# Patient Record
Sex: Female | Born: 1960
Health system: Southern US, Community
[De-identification: ages and names within clinical notes are randomized; demographics above are authoritative.]

## PROBLEM LIST (undated history)

## (undated) DIAGNOSIS — I10 Essential (primary) hypertension: Secondary | ICD-10-CM

## (undated) DIAGNOSIS — Z5189 Encounter for other specified aftercare: Secondary | ICD-10-CM

## (undated) DIAGNOSIS — C801 Malignant (primary) neoplasm, unspecified: Secondary | ICD-10-CM

## (undated) DIAGNOSIS — E119 Type 2 diabetes mellitus without complications: Secondary | ICD-10-CM

## (undated) DIAGNOSIS — F209 Schizophrenia, unspecified: Secondary | ICD-10-CM

## (undated) DIAGNOSIS — K589 Irritable bowel syndrome without diarrhea: Secondary | ICD-10-CM

## (undated) DIAGNOSIS — F319 Bipolar disorder, unspecified: Secondary | ICD-10-CM

## (undated) DIAGNOSIS — F1120 Opioid dependence, uncomplicated: Secondary | ICD-10-CM

## (undated) DIAGNOSIS — G8929 Other chronic pain: Secondary | ICD-10-CM

## (undated) HISTORY — PX: ANKLE SURGERY: SHX546

## (undated) HISTORY — PX: ABDOMINAL HYSTERECTOMY: SHX81

## (undated) HISTORY — PX: NEPHRECTOMY: SHX65

---

## 2016-08-28 HISTORY — PX: COLONOSCOPY: SHX174

## 2016-10-15 HISTORY — PX: ESOPHAGOGASTRODUODENOSCOPY: SHX1529

## 2019-04-06 ENCOUNTER — Other Ambulatory Visit: Payer: Self-pay

## 2019-04-06 ENCOUNTER — Encounter: Payer: Self-pay | Admitting: Emergency Medicine

## 2019-04-06 ENCOUNTER — Emergency Department
Admission: EM | Admit: 2019-04-06 | Discharge: 2019-04-06 | Disposition: A | Discharge status: Home / Self Care | Payer: Medicare Other | Source: Home / Self Care

## 2019-04-06 DIAGNOSIS — R1032 Left lower quadrant pain: Secondary | ICD-10-CM | POA: Diagnosis not present

## 2019-04-06 DIAGNOSIS — R197 Diarrhea, unspecified: Secondary | ICD-10-CM

## 2019-04-06 DIAGNOSIS — K921 Melena: Secondary | ICD-10-CM

## 2019-04-06 DIAGNOSIS — R112 Nausea with vomiting, unspecified: Secondary | ICD-10-CM

## 2019-04-06 NOTE — Discharge Instructions (Signed)
°  Due to the severity of your pain and medical history, it is recommended you have your daughter drive you to the hospital this evening for further evaluation and treatment of your symptoms.

## 2019-04-06 NOTE — ED Provider Notes (Signed)
[facility]    CSN: [id number] Arrival date & time: 04/06/19  1548     History   Chief Complaint Chief Complaint  Patient presents with  . Emesis    HPI Amy Nunez is a 58 y.o. female.   HPI Amy Nunez is a 58 y.o. female presenting to UC with c/o 6 days of worsening nausea, vomiting, diarrhea with worsening Left lower abdominal cramping as well. Today she started to pass blood clots in her stool.  Hx of diverticulitis in the past, she needed to be hospitalized for it. She moved to the area 3 weeks ago so she is unfamiliar with the area. She has taken her previously prescribed Vicodin and Morphine for Right leg pain but that has not helped with her abdominal pain. Denies fever or chills, no urinary symptoms. Hx of diabetes. Her sugar was about 120 today, but has been okay so far. No HA or dizziness.   History reviewed. No pertinent past medical history.  There are no active problems to display for this patient.   History reviewed. No pertinent surgical history.  OB History   No obstetric history on file.      Home Medications    Prior to Admission medications   Not on File    Family History No family history on file.  Social History Social History   Tobacco Use  . Smoking status: Never Smoker  . Smokeless tobacco: Never Used  Substance Use Topics  . Alcohol use: Not on file  . Drug use: Not on file     Allergies   Penicillins, Ciprofloxacin, and Red dye   Review of Systems Review of Systems  Constitutional: Negative for chills and fever.  Gastrointestinal: Positive for abdominal pain, blood in stool, diarrhea, nausea and vomiting.  Neurological: Negative for dizziness, light-headedness and headaches.     Physical Exam Triage Vital Signs ED Triage Vitals [04/06/19 1610]  Enc Vitals Group     BP 113/68     Pulse Rate 84     Resp      Temp 98.1 F (36.7 C)     Temp Source Oral     SpO2 95 %     Weight 204 lb (92.5 kg)      Height      Head Circumference      Peak Flow      Pain Score      Pain Loc      Pain Edu?      Excl. in [place]?    No data found.  Updated Vital Signs BP 113/68 (BP Location: Left Arm)   Pulse 84   Temp 98.1 F (36.7 C) (Oral)   Wt 204 lb (92.5 kg)   SpO2 95%   Visual Acuity Right Eye Distance:   Left Eye Distance:   Bilateral Distance:    Right Eye Near:   Left Eye Near:    Bilateral Near:     Physical Exam Vitals signs and nursing note reviewed.  Constitutional:      Appearance: Normal appearance. She is well-developed.  HENT:     Head: Normocephalic and atraumatic.  Neck:     Musculoskeletal: Normal range of motion.  Cardiovascular:     Rate and Rhythm: Normal rate and regular rhythm.  Pulmonary:     Effort: Pulmonary effort is normal.     Breath sounds: Normal breath sounds.  Abdominal:     General: There is no distension.  Palpations: Abdomen is soft.     Tenderness: There is abdominal tenderness. There is guarding. There is no right CVA tenderness or left CVA tenderness.    Musculoskeletal: Normal range of motion.  Skin:    General: Skin is warm and dry.  Neurological:     Mental Status: She is alert and oriented to person, place, and time.  Psychiatric:        Behavior: Behavior normal.      UC Treatments / Results  Labs (all labs ordered are listed, but only abnormal results are displayed) Labs Reviewed - No data to display  EKG   Radiology No results found.  Procedures Procedures (including critical care time)  Medications Ordered in UC Medications - No data to display  Initial Impression / Assessment and Plan / UC Course  I have reviewed the triage vital signs and the nursing notes.  Pertinent labs & imaging results that were available during my care of the patient were reviewed by me and considered in my medical decision making (see chart for details).    Due to hx of needing hospitalization for diverticulitis, severity  of pain and new symptom of blood in stool today, recommend pt evaluated in emergency department for further evaluation and treatment. Pt's daughter drove her here. She declined EMS transport but is agreeable to go to [facility] Emergency Department.    Final Clinical Impressions(s) / UC Diagnoses   Final diagnoses:  LLQ abdominal pain  Blood clots in stool  Nausea vomiting and diarrhea     Discharge Instructions      Due to the severity of your pain and medical history, it is recommended you have your daughter drive you to the hospital this evening for further evaluation and treatment of your symptoms.     ED Prescriptions    None     Controlled Substance Prescriptions [place] Controlled Substance Registry consulted? Not Applicable   Tyrell Antonio 04/06/19 1643

## 2019-04-06 NOTE — ED Triage Notes (Signed)
Patient c/o vomiting, diarrhea, left sided abdominal cramps x 6 days, fever on Friday.

## 2019-04-07 MED ORDER — INSULIN LISPRO 100 UNIT/ML ~~LOC~~ SOLN
1.00 | SUBCUTANEOUS | Status: DC
Start: ? — End: 2019-04-07

## 2019-04-07 MED ORDER — GENERIC EXTERNAL MEDICATION
Status: DC
Start: ? — End: 2019-04-07

## 2019-04-07 MED ORDER — AMLODIPINE BESYLATE 10 MG PO TABS
10.00 | ORAL_TABLET | ORAL | Status: DC
Start: 2019-04-08 — End: 2019-04-07

## 2019-04-07 MED ORDER — QUETIAPINE FUMARATE 200 MG PO TABS
200.00 | ORAL_TABLET | ORAL | Status: DC
Start: 2019-04-08 — End: 2019-04-07

## 2019-04-07 MED ORDER — SODIUM CHLORIDE 0.9 % IV SOLN
10.00 | INTRAVENOUS | Status: DC
Start: ? — End: 2019-04-07

## 2019-04-07 MED ORDER — TOPIRAMATE 25 MG PO TABS
25.00 | ORAL_TABLET | ORAL | Status: DC
Start: 2019-04-07 — End: 2019-04-07

## 2019-04-07 MED ORDER — INSULIN LISPRO 100 UNIT/ML ~~LOC~~ SOLN
1.00 | SUBCUTANEOUS | Status: DC
Start: 2019-04-07 — End: 2019-04-07

## 2019-04-07 MED ORDER — SODIUM CHLORIDE 0.9 % IV SOLN
75.00 | INTRAVENOUS | Status: DC
Start: ? — End: 2019-04-07

## 2019-04-07 MED ORDER — ONDANSETRON HCL 4 MG/2ML IJ SOLN
4.00 | INTRAMUSCULAR | Status: DC
Start: ? — End: 2019-04-07

## 2019-04-07 MED ORDER — GABAPENTIN 300 MG PO CAPS
300.00 | ORAL_CAPSULE | ORAL | Status: DC
Start: 2019-04-07 — End: 2019-04-07

## 2019-04-07 MED ORDER — MORPHINE SULFATE ER 15 MG PO TBCR
30.00 | EXTENDED_RELEASE_TABLET | ORAL | Status: DC
Start: 2019-04-07 — End: 2019-04-07

## 2019-04-07 MED ORDER — TRAZODONE HCL 100 MG PO TABS
100.00 | ORAL_TABLET | ORAL | Status: DC
Start: 2019-04-07 — End: 2019-04-07

## 2019-04-07 MED ORDER — LISINOPRIL 20 MG PO TABS
20.00 | ORAL_TABLET | ORAL | Status: DC
Start: 2019-04-08 — End: 2019-04-07

## 2019-04-07 MED ORDER — ENOXAPARIN SODIUM 40 MG/0.4ML ~~LOC~~ SOLN
40.00 | SUBCUTANEOUS | Status: DC
Start: 2019-04-08 — End: 2019-04-07

## 2019-04-07 MED ORDER — BENZTROPINE MESYLATE 1 MG PO TABS
1.00 | ORAL_TABLET | ORAL | Status: DC
Start: 2019-04-08 — End: 2019-04-07

## 2019-04-07 MED ORDER — ASPIRIN EC 81 MG PO TBEC
81.00 | DELAYED_RELEASE_TABLET | ORAL | Status: DC
Start: 2019-04-08 — End: 2019-04-07

## 2019-04-07 MED ORDER — PANTOPRAZOLE SODIUM 40 MG PO TBEC
40.00 | DELAYED_RELEASE_TABLET | ORAL | Status: DC
Start: 2019-04-08 — End: 2019-04-07

## 2019-04-07 MED ORDER — INSULIN GLARGINE 100 UNIT/ML ~~LOC~~ SOLN
1.00 | SUBCUTANEOUS | Status: DC
Start: 2019-04-07 — End: 2019-04-07

## 2019-04-07 MED ORDER — INSULIN GLARGINE 100 UNIT/ML ~~LOC~~ SOLN
1.00 | SUBCUTANEOUS | Status: DC
Start: ? — End: 2019-04-07

## 2019-04-07 MED ORDER — ATORVASTATIN CALCIUM 80 MG PO TABS
80.00 | ORAL_TABLET | ORAL | Status: DC
Start: 2019-04-08 — End: 2019-04-07

## 2019-04-07 MED ORDER — QUETIAPINE FUMARATE 200 MG PO TABS
400.00 | ORAL_TABLET | ORAL | Status: DC
Start: 2019-04-07 — End: 2019-04-07

## 2019-04-07 MED ORDER — NITROGLYCERIN 0.4 MG SL SUBL
0.40 | SUBLINGUAL_TABLET | SUBLINGUAL | Status: DC
Start: ? — End: 2019-04-07

## 2019-04-07 MED ORDER — HYDROMORPHONE HCL 1 MG/ML IJ SOLN
.50 | INTRAMUSCULAR | Status: DC
Start: ? — End: 2019-04-07

## 2019-05-17 ENCOUNTER — Emergency Department (HOSPITAL_BASED_OUTPATIENT_CLINIC_OR_DEPARTMENT_OTHER)
Admission: EM | Admit: 2019-05-17 | Discharge: 2019-05-17 | Disposition: A | Discharge status: Home / Self Care | Payer: Medicare Other | Attending: Emergency Medicine | Admitting: Emergency Medicine

## 2019-05-17 ENCOUNTER — Other Ambulatory Visit: Payer: Self-pay

## 2019-05-17 ENCOUNTER — Encounter (HOSPITAL_BASED_OUTPATIENT_CLINIC_OR_DEPARTMENT_OTHER): Payer: Self-pay | Admitting: *Deleted

## 2019-05-17 DIAGNOSIS — E119 Type 2 diabetes mellitus without complications: Secondary | ICD-10-CM | POA: Diagnosis not present

## 2019-05-17 DIAGNOSIS — R197 Diarrhea, unspecified: Secondary | ICD-10-CM | POA: Diagnosis not present

## 2019-05-17 DIAGNOSIS — R112 Nausea with vomiting, unspecified: Secondary | ICD-10-CM | POA: Diagnosis not present

## 2019-05-17 DIAGNOSIS — R1084 Generalized abdominal pain: Secondary | ICD-10-CM | POA: Diagnosis present

## 2019-05-17 DIAGNOSIS — Z87891 Personal history of nicotine dependence: Secondary | ICD-10-CM | POA: Diagnosis not present

## 2019-05-17 DIAGNOSIS — I10 Essential (primary) hypertension: Secondary | ICD-10-CM | POA: Diagnosis not present

## 2019-05-17 HISTORY — DX: Essential (primary) hypertension: I10

## 2019-05-17 HISTORY — DX: Type 2 diabetes mellitus without complications: E11.9

## 2019-05-17 HISTORY — DX: Malignant (primary) neoplasm, unspecified: C80.1

## 2019-05-17 HISTORY — DX: Encounter for other specified aftercare: Z51.89

## 2019-05-17 LAB — CBC WITH DIFFERENTIAL/PLATELET
Abs Immature Granulocytes: 0.03 10*3/uL (ref 0.00–0.07)
Basophils Absolute: 0 10*3/uL (ref 0.0–0.1)
Basophils Relative: 0 %
Eosinophils Absolute: 0.2 10*3/uL (ref 0.0–0.5)
Eosinophils Relative: 2 %
HCT: 37.5 % (ref 36.0–46.0)
Hemoglobin: 12.4 g/dL (ref 12.0–15.0)
Immature Granulocytes: 0 %
Lymphocytes Relative: 26 %
Lymphs Abs: 2.2 10*3/uL (ref 0.7–4.0)
MCH: 31.3 pg (ref 26.0–34.0)
MCHC: 33.1 g/dL (ref 30.0–36.0)
MCV: 94.7 fL (ref 80.0–100.0)
Monocytes Absolute: 0.5 10*3/uL (ref 0.1–1.0)
Monocytes Relative: 6 %
Neutro Abs: 5.5 10*3/uL (ref 1.7–7.7)
Neutrophils Relative %: 66 %
Platelets: 321 10*3/uL (ref 150–400)
RBC: 3.96 MIL/uL (ref 3.87–5.11)
RDW: 13.3 % (ref 11.5–15.5)
WBC: 8.4 10*3/uL (ref 4.0–10.5)
nRBC: 0 % (ref 0.0–0.2)

## 2019-05-17 LAB — URINALYSIS, MICROSCOPIC (REFLEX)

## 2019-05-17 LAB — URINALYSIS, ROUTINE W REFLEX MICROSCOPIC
Bilirubin Urine: NEGATIVE
Glucose, UA: NEGATIVE mg/dL
Hgb urine dipstick: NEGATIVE
Ketones, ur: NEGATIVE mg/dL
Nitrite: NEGATIVE
Protein, ur: NEGATIVE mg/dL
Specific Gravity, Urine: 1.02 (ref 1.005–1.030)
pH: 6 (ref 5.0–8.0)

## 2019-05-17 LAB — COMPREHENSIVE METABOLIC PANEL
ALT: 14 U/L (ref 0–44)
AST: 18 U/L (ref 15–41)
Albumin: 4.2 g/dL (ref 3.5–5.0)
Alkaline Phosphatase: 129 U/L — ABNORMAL HIGH (ref 38–126)
Anion gap: 14 (ref 5–15)
BUN: 16 mg/dL (ref 6–20)
CO2: 23 mmol/L (ref 22–32)
Calcium: 8.9 mg/dL (ref 8.9–10.3)
Chloride: 103 mmol/L (ref 98–111)
Creatinine, Ser: 1.15 mg/dL — ABNORMAL HIGH (ref 0.44–1.00)
GFR calc Af Amer: 60 mL/min — ABNORMAL LOW (ref >60)
GFR calc non Af Amer: 52 mL/min — ABNORMAL LOW (ref >60)
Glucose, Bld: 160 mg/dL — ABNORMAL HIGH (ref 70–99)
Potassium: 3.9 mmol/L (ref 3.5–5.1)
Sodium: 140 mmol/L (ref 135–145)
Total Bilirubin: 0.2 mg/dL — ABNORMAL LOW (ref 0.3–1.2)
Total Protein: 7.6 g/dL (ref 6.5–8.1)

## 2019-05-17 LAB — LIPASE, BLOOD: Lipase: 39 U/L (ref 11–51)

## 2019-05-17 MED ORDER — PROMETHAZINE HCL 25 MG PO TABS: 25.0000 mg | ORAL_TABLET | Freq: Four times a day (QID) | ORAL | 0 refills | Status: DC | PRN

## 2019-05-17 MED ORDER — SODIUM CHLORIDE 0.9 % IV BOLUS
1000.0000 mL | Freq: Once | INTRAVENOUS | Status: AC
  Administered 2019-05-17: 1000 mL via INTRAVENOUS

## 2019-05-17 MED ORDER — PROMETHAZINE HCL 25 MG/ML IJ SOLN
25.0000 mg | Freq: Once | INTRAMUSCULAR | Status: AC
  Administered 2019-05-17: 25 mg via INTRAVENOUS
  Filled 2019-05-17: qty 1

## 2019-05-17 MED ORDER — SODIUM CHLORIDE 0.9 % IV BOLUS: 1000.0000 mL | Freq: Once | INTRAVENOUS | Status: DC

## 2019-05-17 MED ORDER — KETAMINE HCL 10 MG/ML IJ SOLN
0.2000 mg/kg | Freq: Once | INTRAMUSCULAR | Status: AC
  Administered 2019-05-17: 16 mg via INTRAVENOUS
  Filled 2019-05-17: qty 1

## 2019-05-17 NOTE — ED Provider Notes (Signed)
[place] EMERGENCY DEPARTMENT Provider Note   CSN: [id number] Arrival date & time: 05/17/19  1742     History   Chief Complaint Chief Complaint  Patient presents with   Abdominal Pain    HPI Amy Nunez is a 58 y.o. female.     Patient with history of diabetes, renal cancer status post right-sided nephrectomy, hysterectomy --presents to the emergency department with complaint of ongoing abdominal pain over the past month.  Patient has been seen at an outside emergency department for this.  She had a CT scan a week ago which was essentially negative.  She continues to have cramping abdominal pain.  She states that she saw GI.  Records show visit to GI on 04/30/2019.  She had a KUB with a nonobstructive bowel gas pattern.  She had a ultrasound study which was negative.  Stool studies were positive for C. difficile.  She is currently in the middle of a course of oral vancomycin.  Patient states that she has had multiple episodes of diarrhea and has vomited multiple times.  Patient has chronic pain and is on oral narcotics chronically (MS Contin and Norco).  She denies any fevers, chest pain or shortness of breath.  She denies any blood in her stool.  She denies any urinary symptoms.  She has not had a colonoscopy in a long time.       Past Medical History:  Diagnosis Date   Blood transfusion without reported diagnosis    Cancer ([place])    renal cancer   Diabetes mellitus without complication ([place])    Hypertension     There are no active problems to display for this patient.   Past Surgical History:  Procedure Laterality Date   ABDOMINAL HYSTERECTOMY     ANKLE SURGERY     NEPHRECTOMY Right      OB History   No obstetric history on file.      Home Medications    Prior to Admission medications   Not on File    Family History No family history on file.  Social History Social History   Tobacco Use   Smoking status: Former Smoker    Smokeless tobacco: Never Used  Substance Use Topics   Alcohol use: Not Currently    Frequency: Never   Drug use: Never     Allergies   Penicillins, Ciprofloxacin, and Red dye   Review of Systems Review of Systems  Constitutional: Negative for fever.  HENT: Negative for rhinorrhea and sore throat.   Eyes: Negative for redness.  Respiratory: Negative for cough.   Cardiovascular: Negative for chest pain.  Gastrointestinal: Positive for abdominal pain, diarrhea, nausea and vomiting. Negative for blood in stool and constipation.  Genitourinary: Negative for dysuria.  Musculoskeletal: Negative for myalgias.  Skin: Negative for rash.  Neurological: Negative for headaches.     Physical Exam Updated Vital Signs BP (!) 167/96    Pulse 87    Temp 98.3 F (36.8 C) (Oral)    Resp 18    Ht 5\' 5"  (1.651 m)    Wt 81.6 kg    SpO2 99%    BMI 29.95 kg/m   Physical Exam Vitals signs and nursing note reviewed.  Constitutional:      Appearance: She is well-developed.  HENT:     Head: Normocephalic and atraumatic.  Eyes:     General:        Right eye: No discharge.  Left eye: No discharge.     Conjunctiva/sclera: Conjunctivae normal.  Neck:     Musculoskeletal: Normal range of motion and neck supple.  Cardiovascular:     Rate and Rhythm: Normal rate and regular rhythm.     Heart sounds: Normal heart sounds.  Pulmonary:     Effort: Pulmonary effort is normal.     Breath sounds: Normal breath sounds.  Abdominal:     Palpations: Abdomen is soft.     Tenderness: There is generalized abdominal tenderness (Mild generalized tenderness). There is no guarding or rebound. Negative signs include Murphy's sign and McBurney's sign.  Skin:    General: Skin is warm and dry.  Neurological:     Mental Status: She is alert.      ED Treatments / Results  Labs (all labs ordered are listed, but only abnormal results are displayed) Labs Reviewed  URINALYSIS, ROUTINE W REFLEX MICROSCOPIC  - Abnormal; Notable for the following components:      Result Value   Leukocytes,Ua TRACE (*)    All other components within normal limits  COMPREHENSIVE METABOLIC PANEL - Abnormal; Notable for the following components:   Glucose, Bld 160 (*)    Creatinine, Ser 1.15 (*)    Alkaline Phosphatase 129 (*)    Total Bilirubin 0.2 (*)    GFR calc non Af Amer 52 (*)    GFR calc Af Amer 60 (*)    All other components within normal limits  URINALYSIS, MICROSCOPIC (REFLEX) - Abnormal; Notable for the following components:   Bacteria, UA FEW (*)    All other components within normal limits  CBC WITH DIFFERENTIAL/PLATELET  LIPASE, BLOOD    EKG None  Radiology No results found.  Procedures Procedures (including critical care time)  Medications Ordered in ED Medications  sodium chloride 0.9 % bolus 1,000 mL (has no administration in time range)  sodium chloride 0.9 % bolus 1,000 mL (1,000 mLs Intravenous New Bag/Given 05/17/19 1922)  ketamine (KETALAR) injection 16 mg (16 mg Intravenous Given 05/17/19 1950)  promethazine (PHENERGAN) injection 25 mg (25 mg Intravenous Given 05/17/19 1946)     Initial Impression / Assessment and Plan / ED Course  I have reviewed the triage vital signs and the nursing notes.  Pertinent labs & imaging results that were available during my care of the patient were reviewed by me and considered in my medical decision making (see chart for details).        Patient seen and examined.  Reviewed records.  Work-up pending.  Vital signs reviewed and are as follows: BP (!) 167/96    Pulse 87    Temp 98.3 F (36.8 C) (Oral)    Resp 18    Ht 5\' 5"  (1.651 m)    Wt 81.6 kg    SpO2 99%    BMI 29.95 kg/m    Work-up tonight in the emergency department is reassuring.  Fluids given, antiemetics given, ketamine given for chronic pain.  Normal white blood cell count.  No electrolyte abnormalities.  Normal lipase.  No signs of UTI.  I do not feel that the patient requires  repeat imaging tonight.  She has had 2 CAT scans in the past 2 months.  She has had stool studies and an ultrasound of the gallbladder.  She is following with GI.  She is encouraged to continue the complete course of her vancomycin.  She is frustrated that she has not had any relief as of yet.  9:13 PM patient  improving symptomatically with Phenergan, fluids, and ketamine.  She has been up to the restroom and does not appear to be in any distress.  Reviewed findings tonight with patient.  I strongly encouraged follow-up with her GI doctor next week.  Comfortable with discharge to home at this time.  Discussed that I do not feel that she needs any repeat imaging in light of previous work-up and reassuring lab work.  The patient was urged to return to the Emergency Department immediately with worsening of current symptoms, worsening abdominal pain, persistent vomiting, blood noted in stools, fever, or any other concerns. The patient verbalized understanding.    Final Clinical Impressions(s) / ED Diagnoses   Final diagnoses:  Generalized abdominal pain  Nausea vomiting and diarrhea   Patient with abdominal pain. Vitals are stable, no fever. Labs reassuring. Imaging not indicated. No signs of dehydration, patient is tolerating PO's. Lungs are clear and no signs suggestive of PNA. Low concern for appendicitis, cholecystitis, pancreatitis, ruptured viscus, UTI, kidney stone, aortic dissection, aortic aneurysm or other emergent abdominal etiology. Supportive therapy indicated with return if symptoms worsen.    ED Discharge Orders         Ordered    promethazine (PHENERGAN) 25 MG tablet  Every 6 hours PRN     05/17/19 2112           Carlisle Cater, Hershal Coria 05/17/19 2115    Tegeler, Gwenyth Allegra, MD 05/17/19 2318

## 2019-05-17 NOTE — Discharge Instructions (Signed)
Please read and follow all provided instructions.  Your diagnoses today include:  1. Generalized abdominal pain   2. Nausea vomiting and diarrhea     Tests performed today include:  Blood counts and electrolytes  Blood tests to check liver and kidney function  Blood tests to check pancreas function  Urine test to look for infection  Vital signs. See below for your results today.   Medications prescribed:   Phenergan (promethazine) - for nausea and vomiting  Take any prescribed medications only as directed.  Home care instructions:   Follow any educational materials contained in this packet.  Continue taking your home medications including the oral vancomycin.  Follow-up instructions: Please follow-up with your primary care provider in the next 2 days for further evaluation of your symptoms.    Return instructions:  SEEK IMMEDIATE MEDICAL ATTENTION IF:  The pain does not go away or becomes severe   A temperature above 101F develops   Repeated vomiting occurs (multiple episodes)   The pain becomes localized to portions of the abdomen. The right side could possibly be appendicitis. In an adult, the left lower portion of the abdomen could be colitis or diverticulitis.   Blood is being passed in stools or vomit (bright red or black tarry stools)   You develop chest pain, difficulty breathing, dizziness or fainting, or become confused, poorly responsive, or inconsolable (young children)  If you have any other emergent concerns regarding your health  Additional Information: Abdominal (belly) pain can be caused by many things. Your caregiver performed an examination and possibly ordered blood/urine tests and imaging (CT scan, x-rays, ultrasound). Many cases can be observed and treated at home after initial evaluation in the emergency department. Even though you are being discharged home, abdominal pain can be unpredictable. Therefore, you need a repeated exam if your pain  does not resolve, returns, or worsens. Most patients with abdominal pain don't have to be admitted to the hospital or have surgery, but serious problems like appendicitis and gallbladder attacks can start out as nonspecific pain. Many abdominal conditions cannot be diagnosed in one visit, so follow-up evaluations are very important.  Your vital signs today were: BP (!) 167/96    Pulse 87    Temp 98.3 F (36.8 C) (Oral)    Resp 18    Ht 5\' 5"  (1.651 m)    Wt 81.6 kg    SpO2 99%    BMI 29.95 kg/m  If your blood pressure (bp) was elevated above 135/85 this visit, please have this repeated by your doctor within one month. --------------

## 2019-05-17 NOTE — ED Triage Notes (Signed)
PT reports she has had abdominal x 1 month. She was evaluated at another hospital and started on antibiotics. States she is still having pain. "I can't get no relief". Reports she has diarrhea x 5 and vomited x 2 today

## 2019-06-18 ENCOUNTER — Emergency Department (HOSPITAL_BASED_OUTPATIENT_CLINIC_OR_DEPARTMENT_OTHER)
Admission: EM | Admit: 2019-06-18 | Discharge: 2019-06-18 | Disposition: A | Discharge status: Home / Self Care | Payer: Medicare HMO | Attending: Emergency Medicine | Admitting: Emergency Medicine

## 2019-06-18 ENCOUNTER — Encounter (HOSPITAL_BASED_OUTPATIENT_CLINIC_OR_DEPARTMENT_OTHER): Payer: Self-pay

## 2019-06-18 ENCOUNTER — Other Ambulatory Visit: Payer: Self-pay

## 2019-06-18 ENCOUNTER — Emergency Department (HOSPITAL_BASED_OUTPATIENT_CLINIC_OR_DEPARTMENT_OTHER): Payer: Medicare HMO

## 2019-06-18 DIAGNOSIS — Z87891 Personal history of nicotine dependence: Secondary | ICD-10-CM | POA: Insufficient documentation

## 2019-06-18 DIAGNOSIS — E119 Type 2 diabetes mellitus without complications: Secondary | ICD-10-CM | POA: Insufficient documentation

## 2019-06-18 DIAGNOSIS — I10 Essential (primary) hypertension: Secondary | ICD-10-CM | POA: Diagnosis not present

## 2019-06-18 DIAGNOSIS — M545 Low back pain, unspecified: Secondary | ICD-10-CM

## 2019-06-18 HISTORY — DX: Irritable bowel syndrome, unspecified: K58.9

## 2019-06-18 MED ORDER — DEXAMETHASONE 4 MG PO TABS
8.0000 mg | ORAL_TABLET | Freq: Once | ORAL | Status: AC
  Administered 2019-06-18: 22:00:00 8 mg via ORAL
  Filled 2019-06-18: qty 2

## 2019-06-18 MED ORDER — HYDROMORPHONE HCL 1 MG/ML IJ SOLN
1.0000 mg | Freq: Once | INTRAMUSCULAR | Status: AC
  Administered 2019-06-18: 22:00:00 1 mg via INTRAMUSCULAR
  Filled 2019-06-18: qty 1

## 2019-06-18 MED ORDER — DEXAMETHASONE 4 MG PO TABS: 4.0000 mg | ORAL_TABLET | Freq: Two times a day (BID) | ORAL | 0 refills | Status: DC

## 2019-06-18 MED ORDER — IBUPROFEN 400 MG PO TABS
600.0000 mg | ORAL_TABLET | Freq: Once | ORAL | Status: AC
  Administered 2019-06-18: 600 mg via ORAL
  Filled 2019-06-18: qty 1

## 2019-06-18 MED ORDER — OXYCODONE-ACETAMINOPHEN 5-325 MG PO TABS: 1.0000 | ORAL_TABLET | Freq: Three times a day (TID) | ORAL | 0 refills | Status: DC | PRN

## 2019-06-18 MED ORDER — LORAZEPAM 1 MG PO TABS
1.0000 mg | ORAL_TABLET | Freq: Once | ORAL | Status: AC
  Administered 2019-06-18: 1 mg via ORAL
  Filled 2019-06-18: qty 1

## 2019-06-18 NOTE — ED Triage Notes (Signed)
Pt c/o lower back pain and bilat hip pain x 2 weeks-pain started after a slip/near fall-NAD-to triage in w/c

## 2019-06-18 NOTE — ED Notes (Signed)
Patient transported to X-ray 

## 2019-06-18 NOTE — Discharge Instructions (Signed)
Continue to take ibuprofen 600 mg every 6 hours as needed for pain in addition to prescribed medications.

## 2019-06-23 NOTE — ED Provider Notes (Signed)
[place] EMERGENCY DEPARTMENT Provider Note   CSN: [id number] Arrival date & time: 06/18/19  2106     History   Chief Complaint Chief Complaint  Patient presents with  . Back Pain    HPI Amy Nunez is a 58 y.o. female.     HPI   58 year old female with lower back and hip pain.  No trauma or strain.  Onset about 2 weeks ago.  Persistent since then.  Hurts at rest and worse with movement.  No numbness or tingling.  Denies any other acute pain.  No rash.  No fevers.  Has been taking over-the-counter pain medication without much improvement.  Past Medical History:  Diagnosis Date  . Blood transfusion without reported diagnosis   . Cancer [facility])    renal cancer  . Diabetes mellitus without complication ([place])   . Hypertension   . IBS (irritable bowel syndrome)     There are no active problems to display for this patient.   Past Surgical History:  Procedure Laterality Date  . ABDOMINAL HYSTERECTOMY    . ANKLE SURGERY    . NEPHRECTOMY Right      OB History   No obstetric history on file.      Home Medications    Prior to Admission medications   Medication Sig Start Date End Date Taking? Authorizing Provider  dexamethasone (DECADRON) 4 MG tablet Take 1 tablet (4 mg total) by mouth 2 (two) times daily. 06/18/19   Virgel Manifold, MD  oxyCODONE-acetaminophen (PERCOCET/ROXICET) 5-325 MG tablet Take 1 tablet by mouth every 8 (eight) hours as needed for severe pain. 06/18/19   Virgel Manifold, MD  promethazine (PHENERGAN) 25 MG tablet Take 1 tablet (25 mg total) by mouth every 6 (six) hours as needed for nausea or vomiting. 05/17/19   Carlisle Cater, PA-C    Family History No family history on file.  Social History Social History   Tobacco Use  . Smoking status: Former Research scientist (life sciences)  . Smokeless tobacco: Never Used  Substance Use Topics  . Alcohol use: Not Currently    Frequency: Never  . Drug use: Never     Allergies   Penicillins,  Ciprofloxacin, and Red dye   Review of Systems Review of Systems  All systems reviewed and negative, other than as noted in HPI.  Physical Exam Updated Vital Signs BP 133/75 (BP Location: Right Arm)   Pulse (!) 108   Temp 99.1 F (37.3 C) (Oral)   Resp 20   SpO2 98%   Physical Exam Vitals signs and nursing note reviewed.  Constitutional:      General: She is not in acute distress.    Appearance: She is well-developed. She is obese.  HENT:     Head: Normocephalic and atraumatic.  Eyes:     General:        Right eye: No discharge.        Left eye: No discharge.     Conjunctiva/sclera: Conjunctivae normal.  Neck:     Musculoskeletal: Neck supple.  Cardiovascular:     Rate and Rhythm: Normal rate and regular rhythm.     Heart sounds: Normal heart sounds. No murmur. No friction rub. No gallop.   Pulmonary:     Effort: Pulmonary effort is normal. No respiratory distress.     Breath sounds: Normal breath sounds.  Abdominal:     General: There is no distension.     Palpations: Abdomen is soft.     Tenderness: There is  no abdominal tenderness.  Musculoskeletal:     Comments: Mild tenderness to palpation across the lumbar spine both in the midline and paraspinally.  Does not seem sniffily worse in the midline.  There are no overlying skin changes.  Strength is normal in bilateral lower extremities.  Sensation is intact to light touch.  Skin:    General: Skin is warm and dry.  Neurological:     Mental Status: She is alert.  Psychiatric:        Behavior: Behavior normal.        Thought Content: Thought content normal.      ED Treatments / Results  Labs (all labs ordered are listed, but only abnormal results are displayed) Labs Reviewed - No data to display  EKG None  Radiology No results found.  Procedures Procedures (including critical care time)  Medications Ordered in ED Medications  HYDROmorphone (DILAUDID) injection 1 mg (1 mg Intramuscular Given  06/18/19 2206)  dexamethasone (DECADRON) tablet 8 mg (8 mg Oral Given 06/18/19 2205)  ibuprofen (ADVIL) tablet 600 mg (600 mg Oral Given 06/18/19 2205)  LORazepam (ATIVAN) tablet 1 mg (1 mg Oral Given 06/18/19 2205)     Initial Impression / Assessment and Plan / ED Course  I have reviewed the triage vital signs and the nursing notes.  Pertinent labs & imaging results that were available during my care of the patient were reviewed by me and considered in my medical decision making (see chart for details).        58 year old female with lower back pain.  Likely musculoskeletal etiology.  No particular "red flags."  Plan symptomatic treatment.  No trauma.  No clear indication for imaging at this time.  Return precautions were discussed.  Outpatient follow-up otherwise.  Final Clinical Impressions(s) / ED Diagnoses   Final diagnoses:  Acute midline low back pain without sciatica    ED Discharge Orders         Ordered    dexamethasone (DECADRON) 4 MG tablet  2 times daily     06/18/19 2238    oxyCODONE-acetaminophen (PERCOCET/ROXICET) 5-325 MG tablet  Every 8 hours PRN     06/18/19 2238           Virgel Manifold, MD 06/23/19 1122

## 2019-06-27 DIAGNOSIS — M25571 Pain in right ankle and joints of right foot: Secondary | ICD-10-CM | POA: Diagnosis not present

## 2019-06-27 DIAGNOSIS — E1169 Type 2 diabetes mellitus with other specified complication: Secondary | ICD-10-CM | POA: Diagnosis not present

## 2019-06-27 DIAGNOSIS — E785 Hyperlipidemia, unspecified: Secondary | ICD-10-CM | POA: Diagnosis not present

## 2019-06-27 DIAGNOSIS — I129 Hypertensive chronic kidney disease with stage 1 through stage 4 chronic kidney disease, or unspecified chronic kidney disease: Secondary | ICD-10-CM | POA: Diagnosis not present

## 2019-06-27 DIAGNOSIS — N183 Chronic kidney disease, stage 3 unspecified: Secondary | ICD-10-CM | POA: Diagnosis not present

## 2019-06-27 DIAGNOSIS — F209 Schizophrenia, unspecified: Secondary | ICD-10-CM | POA: Diagnosis not present

## 2019-06-27 DIAGNOSIS — Z86718 Personal history of other venous thrombosis and embolism: Secondary | ICD-10-CM | POA: Diagnosis not present

## 2019-06-27 DIAGNOSIS — Z85528 Personal history of other malignant neoplasm of kidney: Secondary | ICD-10-CM | POA: Diagnosis not present

## 2019-06-27 DIAGNOSIS — K5790 Diverticulosis of intestine, part unspecified, without perforation or abscess without bleeding: Secondary | ICD-10-CM | POA: Diagnosis not present

## 2019-06-30 DIAGNOSIS — Z79891 Long term (current) use of opiate analgesic: Secondary | ICD-10-CM | POA: Diagnosis not present

## 2019-06-30 DIAGNOSIS — M542 Cervicalgia: Secondary | ICD-10-CM | POA: Diagnosis not present

## 2019-06-30 DIAGNOSIS — M545 Low back pain: Secondary | ICD-10-CM | POA: Diagnosis not present

## 2019-06-30 DIAGNOSIS — M170 Bilateral primary osteoarthritis of knee: Secondary | ICD-10-CM | POA: Diagnosis not present

## 2019-06-30 DIAGNOSIS — M25561 Pain in right knee: Secondary | ICD-10-CM | POA: Diagnosis not present

## 2019-06-30 DIAGNOSIS — M47817 Spondylosis without myelopathy or radiculopathy, lumbosacral region: Secondary | ICD-10-CM | POA: Diagnosis not present

## 2019-06-30 DIAGNOSIS — M5412 Radiculopathy, cervical region: Secondary | ICD-10-CM | POA: Diagnosis not present

## 2019-07-17 DIAGNOSIS — E785 Hyperlipidemia, unspecified: Secondary | ICD-10-CM | POA: Diagnosis not present

## 2019-07-17 DIAGNOSIS — I10 Essential (primary) hypertension: Secondary | ICD-10-CM | POA: Diagnosis not present

## 2019-07-17 DIAGNOSIS — E1169 Type 2 diabetes mellitus with other specified complication: Secondary | ICD-10-CM | POA: Diagnosis not present

## 2019-07-17 DIAGNOSIS — N183 Chronic kidney disease, stage 3 unspecified: Secondary | ICD-10-CM | POA: Diagnosis not present

## 2019-07-17 DIAGNOSIS — Z79899 Other long term (current) drug therapy: Secondary | ICD-10-CM | POA: Diagnosis not present

## 2019-07-17 DIAGNOSIS — R112 Nausea with vomiting, unspecified: Secondary | ICD-10-CM | POA: Diagnosis not present

## 2019-07-28 DIAGNOSIS — Z1231 Encounter for screening mammogram for malignant neoplasm of breast: Secondary | ICD-10-CM | POA: Diagnosis not present

## 2019-07-29 DIAGNOSIS — M170 Bilateral primary osteoarthritis of knee: Secondary | ICD-10-CM | POA: Diagnosis not present

## 2019-07-29 DIAGNOSIS — M47817 Spondylosis without myelopathy or radiculopathy, lumbosacral region: Secondary | ICD-10-CM | POA: Diagnosis not present

## 2019-07-29 DIAGNOSIS — M545 Low back pain: Secondary | ICD-10-CM | POA: Diagnosis not present

## 2019-07-29 DIAGNOSIS — M25561 Pain in right knee: Secondary | ICD-10-CM | POA: Diagnosis not present

## 2019-07-29 DIAGNOSIS — M542 Cervicalgia: Secondary | ICD-10-CM | POA: Diagnosis not present

## 2019-07-29 DIAGNOSIS — Z79891 Long term (current) use of opiate analgesic: Secondary | ICD-10-CM | POA: Diagnosis not present

## 2019-07-29 DIAGNOSIS — M5412 Radiculopathy, cervical region: Secondary | ICD-10-CM | POA: Diagnosis not present

## 2019-08-06 DIAGNOSIS — R922 Inconclusive mammogram: Secondary | ICD-10-CM | POA: Diagnosis not present

## 2019-08-06 DIAGNOSIS — R928 Other abnormal and inconclusive findings on diagnostic imaging of breast: Secondary | ICD-10-CM | POA: Diagnosis not present

## 2019-08-08 DIAGNOSIS — K3184 Gastroparesis: Secondary | ICD-10-CM | POA: Diagnosis not present

## 2019-08-08 DIAGNOSIS — E1169 Type 2 diabetes mellitus with other specified complication: Secondary | ICD-10-CM | POA: Diagnosis not present

## 2019-08-08 DIAGNOSIS — F1120 Opioid dependence, uncomplicated: Secondary | ICD-10-CM | POA: Diagnosis not present

## 2019-08-08 DIAGNOSIS — D8481 Immunodeficiency due to conditions classified elsewhere: Secondary | ICD-10-CM | POA: Diagnosis not present

## 2019-08-08 DIAGNOSIS — R110 Nausea: Secondary | ICD-10-CM | POA: Diagnosis not present

## 2019-08-08 DIAGNOSIS — F209 Schizophrenia, unspecified: Secondary | ICD-10-CM | POA: Diagnosis not present

## 2019-08-08 DIAGNOSIS — E1143 Type 2 diabetes mellitus with diabetic autonomic (poly)neuropathy: Secondary | ICD-10-CM | POA: Diagnosis not present

## 2019-08-08 DIAGNOSIS — E1140 Type 2 diabetes mellitus with diabetic neuropathy, unspecified: Secondary | ICD-10-CM | POA: Diagnosis not present

## 2019-08-08 DIAGNOSIS — E669 Obesity, unspecified: Secondary | ICD-10-CM | POA: Diagnosis not present

## 2019-08-08 DIAGNOSIS — E785 Hyperlipidemia, unspecified: Secondary | ICD-10-CM | POA: Diagnosis not present

## 2019-08-28 DIAGNOSIS — M542 Cervicalgia: Secondary | ICD-10-CM | POA: Diagnosis not present

## 2019-08-28 DIAGNOSIS — F319 Bipolar disorder, unspecified: Secondary | ICD-10-CM | POA: Diagnosis not present

## 2019-08-28 DIAGNOSIS — M545 Low back pain: Secondary | ICD-10-CM | POA: Diagnosis not present

## 2019-08-28 DIAGNOSIS — M170 Bilateral primary osteoarthritis of knee: Secondary | ICD-10-CM | POA: Diagnosis not present

## 2019-08-28 DIAGNOSIS — M5412 Radiculopathy, cervical region: Secondary | ICD-10-CM | POA: Diagnosis not present

## 2019-08-28 DIAGNOSIS — M4712 Other spondylosis with myelopathy, cervical region: Secondary | ICD-10-CM | POA: Diagnosis not present

## 2019-08-28 DIAGNOSIS — M47817 Spondylosis without myelopathy or radiculopathy, lumbosacral region: Secondary | ICD-10-CM | POA: Diagnosis not present

## 2019-09-01 ENCOUNTER — Emergency Department (HOSPITAL_BASED_OUTPATIENT_CLINIC_OR_DEPARTMENT_OTHER): Payer: Medicare HMO

## 2019-09-01 ENCOUNTER — Other Ambulatory Visit: Payer: Self-pay

## 2019-09-01 ENCOUNTER — Inpatient Hospital Stay (HOSPITAL_BASED_OUTPATIENT_CLINIC_OR_DEPARTMENT_OTHER)
Admission: EM | Admit: 2019-09-01 | Discharge: 2019-09-04 | DRG: 103 | Disposition: A | Discharge status: Home / Self Care | Payer: Medicare HMO | Attending: Family Medicine | Admitting: Family Medicine

## 2019-09-01 ENCOUNTER — Encounter (HOSPITAL_BASED_OUTPATIENT_CLINIC_OR_DEPARTMENT_OTHER): Payer: Self-pay | Admitting: *Deleted

## 2019-09-01 DIAGNOSIS — N179 Acute kidney failure, unspecified: Secondary | ICD-10-CM | POA: Diagnosis present

## 2019-09-01 DIAGNOSIS — Z85528 Personal history of other malignant neoplasm of kidney: Secondary | ICD-10-CM

## 2019-09-01 DIAGNOSIS — K580 Irritable bowel syndrome with diarrhea: Secondary | ICD-10-CM | POA: Diagnosis not present

## 2019-09-01 DIAGNOSIS — G43909 Migraine, unspecified, not intractable, without status migrainosus: Principal | ICD-10-CM | POA: Diagnosis present

## 2019-09-01 DIAGNOSIS — E11649 Type 2 diabetes mellitus with hypoglycemia without coma: Secondary | ICD-10-CM | POA: Diagnosis not present

## 2019-09-01 DIAGNOSIS — R0789 Other chest pain: Secondary | ICD-10-CM | POA: Diagnosis present

## 2019-09-01 DIAGNOSIS — I10 Essential (primary) hypertension: Secondary | ICD-10-CM | POA: Diagnosis present

## 2019-09-01 DIAGNOSIS — D72829 Elevated white blood cell count, unspecified: Secondary | ICD-10-CM | POA: Diagnosis present

## 2019-09-01 DIAGNOSIS — K589 Irritable bowel syndrome without diarrhea: Secondary | ICD-10-CM | POA: Diagnosis present

## 2019-09-01 DIAGNOSIS — I959 Hypotension, unspecified: Secondary | ICD-10-CM | POA: Diagnosis present

## 2019-09-01 DIAGNOSIS — Z20822 Contact with and (suspected) exposure to covid-19: Secondary | ICD-10-CM | POA: Diagnosis present

## 2019-09-01 DIAGNOSIS — Z87891 Personal history of nicotine dependence: Secondary | ICD-10-CM

## 2019-09-01 DIAGNOSIS — R531 Weakness: Secondary | ICD-10-CM | POA: Diagnosis not present

## 2019-09-01 DIAGNOSIS — R109 Unspecified abdominal pain: Secondary | ICD-10-CM | POA: Diagnosis present

## 2019-09-01 DIAGNOSIS — Z905 Acquired absence of kidney: Secondary | ICD-10-CM | POA: Diagnosis not present

## 2019-09-01 DIAGNOSIS — E872 Acidosis: Secondary | ICD-10-CM | POA: Diagnosis present

## 2019-09-01 DIAGNOSIS — Z794 Long term (current) use of insulin: Secondary | ICD-10-CM

## 2019-09-01 DIAGNOSIS — G459 Transient cerebral ischemic attack, unspecified: Secondary | ICD-10-CM | POA: Diagnosis not present

## 2019-09-01 DIAGNOSIS — I69398 Other sequelae of cerebral infarction: Secondary | ICD-10-CM | POA: Diagnosis not present

## 2019-09-01 DIAGNOSIS — E860 Dehydration: Secondary | ICD-10-CM | POA: Diagnosis present

## 2019-09-01 DIAGNOSIS — E119 Type 2 diabetes mellitus without complications: Secondary | ICD-10-CM

## 2019-09-01 LAB — PROTIME-INR
INR: 1 (ref 0.8–1.2)
Prothrombin Time: 13.5 seconds (ref 11.4–15.2)

## 2019-09-01 LAB — CBC WITH DIFFERENTIAL/PLATELET
Abs Immature Granulocytes: 0.02 10*3/uL (ref 0.00–0.07)
Basophils Absolute: 0 10*3/uL (ref 0.0–0.1)
Basophils Relative: 0 %
Eosinophils Absolute: 0.1 10*3/uL (ref 0.0–0.5)
Eosinophils Relative: 1 %
HCT: 42 % (ref 36.0–46.0)
Hemoglobin: 14 g/dL (ref 12.0–15.0)
Immature Granulocytes: 0 %
Lymphocytes Relative: 25 %
Lymphs Abs: 2.7 10*3/uL (ref 0.7–4.0)
MCH: 31.7 pg (ref 26.0–34.0)
MCHC: 33.3 g/dL (ref 30.0–36.0)
MCV: 95.2 fL (ref 80.0–100.0)
Monocytes Absolute: 0.6 10*3/uL (ref 0.1–1.0)
Monocytes Relative: 5 %
Neutro Abs: 7.5 10*3/uL (ref 1.7–7.7)
Neutrophils Relative %: 69 %
Platelets: 397 10*3/uL (ref 150–400)
RBC: 4.41 MIL/uL (ref 3.87–5.11)
RDW: 13 % (ref 11.5–15.5)
WBC: 10.9 10*3/uL — ABNORMAL HIGH (ref 4.0–10.5)
nRBC: 0 % (ref 0.0–0.2)

## 2019-09-01 LAB — COMPREHENSIVE METABOLIC PANEL
ALT: 18 U/L (ref 0–44)
AST: 20 U/L (ref 15–41)
Albumin: 4.4 g/dL (ref 3.5–5.0)
Alkaline Phosphatase: 111 U/L (ref 38–126)
Anion gap: 16 — ABNORMAL HIGH (ref 5–15)
BUN: 32 mg/dL — ABNORMAL HIGH (ref 6–20)
CO2: 13 mmol/L — ABNORMAL LOW (ref 22–32)
Calcium: 9.3 mg/dL (ref 8.9–10.3)
Chloride: 106 mmol/L (ref 98–111)
Creatinine, Ser: 2.39 mg/dL — ABNORMAL HIGH (ref 0.44–1.00)
GFR calc Af Amer: 25 mL/min — ABNORMAL LOW (ref >60)
GFR calc non Af Amer: 21 mL/min — ABNORMAL LOW (ref >60)
Glucose, Bld: 277 mg/dL — ABNORMAL HIGH (ref 70–99)
Potassium: 5 mmol/L (ref 3.5–5.1)
Sodium: 135 mmol/L (ref 135–145)
Total Bilirubin: 0.5 mg/dL (ref 0.3–1.2)
Total Protein: 7.9 g/dL (ref 6.5–8.1)

## 2019-09-01 LAB — URINALYSIS, ROUTINE W REFLEX MICROSCOPIC
Bilirubin Urine: NEGATIVE
Glucose, UA: NEGATIVE mg/dL
Hgb urine dipstick: NEGATIVE
Ketones, ur: NEGATIVE mg/dL
Leukocytes,Ua: NEGATIVE
Nitrite: NEGATIVE
Protein, ur: 30 mg/dL — AB
Specific Gravity, Urine: 1.025 (ref 1.005–1.030)
pH: 5.5 (ref 5.0–8.0)

## 2019-09-01 LAB — URINALYSIS, MICROSCOPIC (REFLEX)

## 2019-09-01 LAB — LACTIC ACID, PLASMA
Lactic Acid, Venous: 2.2 mmol/L (ref 0.5–1.9)
Lactic Acid, Venous: 2.8 mmol/L (ref 0.5–1.9)

## 2019-09-01 LAB — APTT: aPTT: 30 seconds (ref 24–36)

## 2019-09-01 LAB — SARS CORONAVIRUS 2 AG (30 MIN TAT): SARS Coronavirus 2 Ag: NEGATIVE

## 2019-09-01 LAB — CBG MONITORING, ED: Glucose-Capillary: 196 mg/dL — ABNORMAL HIGH (ref 70–99)

## 2019-09-01 LAB — TROPONIN I (HIGH SENSITIVITY)
Troponin I (High Sensitivity): 7 ng/L (ref <18)
Troponin I (High Sensitivity): 7 ng/L (ref <18)

## 2019-09-01 LAB — LIPASE, BLOOD: Lipase: 69 U/L — ABNORMAL HIGH (ref 11–51)

## 2019-09-01 MED ORDER — ATORVASTATIN CALCIUM 40 MG PO TABS
40.0000 mg | ORAL_TABLET | Freq: Every day | ORAL | Status: DC
  Administered 2019-09-03 (×2): 40 mg via ORAL
  Filled 2019-09-01 (×3): qty 1

## 2019-09-01 MED ORDER — METRONIDAZOLE IN NACL 5-0.79 MG/ML-% IV SOLN
500.0000 mg | Freq: Once | INTRAVENOUS | Status: AC
  Administered 2019-09-01: 18:00:00 500 mg via INTRAVENOUS
  Filled 2019-09-01: qty 100

## 2019-09-01 MED ORDER — ONDANSETRON HCL 4 MG/2ML IJ SOLN: 4.0000 mg | Freq: Once | INTRAMUSCULAR | Status: AC

## 2019-09-01 MED ORDER — QUETIAPINE FUMARATE 100 MG PO TABS
200.0000 mg | ORAL_TABLET | Freq: Every day | ORAL | Status: DC
  Administered 2019-09-01 – 2019-09-03 (×3): 200 mg via ORAL
  Filled 2019-09-01: qty 8
  Filled 2019-09-01: qty 2
  Filled 2019-09-01 (×2): qty 8

## 2019-09-01 MED ORDER — AZTREONAM 2 G IJ SOLR
INTRAMUSCULAR | Status: AC
  Filled 2019-09-01: qty 2

## 2019-09-01 MED ORDER — SODIUM CHLORIDE 0.9 % IV SOLN
1.0000 g | Freq: Three times a day (TID) | INTRAVENOUS | Status: DC
  Administered 2019-09-02 – 2019-09-04 (×8): 1 g via INTRAVENOUS
  Filled 2019-09-01 (×6): qty 1

## 2019-09-01 MED ORDER — SODIUM CHLORIDE 0.9 % IV BOLUS (SEPSIS)
1000.0000 mL | Freq: Once | INTRAVENOUS | Status: AC
  Administered 2019-09-01: 22:00:00 1000 mL via INTRAVENOUS

## 2019-09-01 MED ORDER — INSULIN GLARGINE 100 UNIT/ML ~~LOC~~ SOLN
60.0000 [IU] | Freq: Every day | SUBCUTANEOUS | Status: DC
  Administered 2019-09-03 (×2): 60 [IU] via SUBCUTANEOUS
  Filled 2019-09-01 (×4): qty 0.6
  Filled 2019-09-01: qty 1

## 2019-09-01 MED ORDER — SODIUM CHLORIDE 0.9 % IV BOLUS (SEPSIS)
1000.0000 mL | Freq: Once | INTRAVENOUS | Status: AC
  Administered 2019-09-01: 20:00:00 1000 mL via INTRAVENOUS

## 2019-09-01 MED ORDER — DICYCLOMINE HCL 10 MG/ML IM SOLN
20.0000 mg | Freq: Once | INTRAMUSCULAR | Status: AC
  Administered 2019-09-01: 20 mg via INTRAMUSCULAR
  Filled 2019-09-01: qty 2

## 2019-09-01 MED ORDER — ACETAMINOPHEN 650 MG RE SUPP
650.0000 mg | Freq: Once | RECTAL | Status: AC
  Administered 2019-09-01: 21:00:00 650 mg via RECTAL
  Filled 2019-09-01: qty 1

## 2019-09-01 MED ORDER — ONDANSETRON HCL 4 MG/2ML IJ SOLN
INTRAMUSCULAR | Status: AC
  Administered 2019-09-01: 4 mg via INTRAVENOUS
  Filled 2019-09-01: qty 2

## 2019-09-01 MED ORDER — INSULIN ASPART 100 UNIT/ML ~~LOC~~ SOLN
0.0000 [IU] | Freq: Three times a day (TID) | SUBCUTANEOUS | Status: DC
  Administered 2019-09-02 – 2019-09-03 (×2): 2 [IU] via SUBCUTANEOUS
  Administered 2019-09-04: 1 [IU] via SUBCUTANEOUS
  Filled 2019-09-01: qty 1

## 2019-09-01 MED ORDER — PROCHLORPERAZINE MALEATE 10 MG PO TABS
5.0000 mg | ORAL_TABLET | Freq: Once | ORAL | Status: AC
  Administered 2019-09-01: 5 mg via ORAL
  Filled 2019-09-01: qty 1

## 2019-09-01 MED ORDER — SODIUM CHLORIDE 0.9 % IV BOLUS (SEPSIS)
1000.0000 mL | Freq: Once | INTRAVENOUS | Status: AC
  Administered 2019-09-01: 18:00:00 1000 mL via INTRAVENOUS

## 2019-09-01 MED ORDER — ONDANSETRON HCL 4 MG/2ML IJ SOLN
4.0000 mg | Freq: Once | INTRAMUSCULAR | Status: AC
  Administered 2019-09-01: 20:00:00 4 mg via INTRAVENOUS
  Filled 2019-09-01: qty 2

## 2019-09-01 MED ORDER — SODIUM CHLORIDE 0.9 % IV SOLN
2.0000 g | Freq: Once | INTRAVENOUS | Status: AC
  Administered 2019-09-01: 2 g via INTRAVENOUS

## 2019-09-01 NOTE — ED Notes (Signed)
C/o n/v, diarrhea fever  X 2 weeks    fatigue

## 2019-09-01 NOTE — ED Triage Notes (Signed)
C/o n/v/d. H/a and fever x 1 week

## 2019-09-01 NOTE — Progress Notes (Signed)
Notified bedside nurse of need to administer fluid bolus 2721 cc needed and do not see blood culture documented anywhere.

## 2019-09-01 NOTE — Progress Notes (Signed)
Notified bedside nurse of need to draw repeat lactic acid @ 2000.

## 2019-09-01 NOTE — Progress Notes (Signed)
Notified bedside nurse of need to draw lactic acid and blood cultures and to give antibiotics.

## 2019-09-01 NOTE — ED Notes (Signed)
Patient drank one gingerale; no vomiting noted. Will continue to monitor; crackers provided for PO challenge.

## 2019-09-01 NOTE — ED Provider Notes (Addendum)
[facility] Provider Note   CSN: [id number] Arrival date & time: 09/01/19  1507     History Chief Complaint  Patient presents with  . Emesis    Amy [facility] is a 59 y.o. female.  HPI   59 year old female well history of renal carcinoma s/p right nephrectomy, diabetes, hypertension, IBS, diverticulosis, diverticulitis, who presents to the emergency [facility] today for evaluation of multiple complaints.  Patient states for the last several weeks she has had diarrhea.  She saw her PCP who started her on medications for diverticulitis.  She not have CT scan at that time because she had just recently had 1.  She has 2 days left of the antibiotics but states she feels like her diarrhea is much worse than before.  She is having about 15 stools a day.  Right now the stool is watery and yellow but she has had stools that were green, dark, and also noticed that she had some red blood just on the toilet tissue one or two times.  She has had fevers as well.  Last had a temperature yesterday of 102F.  No fevers today. She has a h/o c-diff.  She has also had nausea vomiting and left lower quadrant abdominal pain.  She reports that she has been feeling generally weak and has been getting very lightheaded when she stands up.  This is led her to have multiple syncopal episodes at home.  Since then she does complain of having a headache.  She denies any visual changes.  She is complaining of weakness on the left side of her body as well as numbness/paresthesias to the arm and leg.  She feels like she has been having some slurred speech as well and feels like the left side of her face looks different than the right.  She is not on any blood thinning medications.  She is also complaining of some chest pressure.  She denies any overt pain or pain with inspiration.  She has been feeling a little bit short of breath.  She has had no cough.  Past Medical History:  Diagnosis Date    . Blood transfusion without reported diagnosis   . Cancer [facility])    renal cancer  . Diabetes mellitus without complication ([place])   . Hypertension   . IBS (irritable bowel syndrome)     Patient Active Problem List   Diagnosis Date Noted  . Left-sided weakness 09/01/2019    Past Surgical History:  Procedure Laterality Date  . ABDOMINAL HYSTERECTOMY    . ANKLE SURGERY    . NEPHRECTOMY Right      OB History   No obstetric history on file.     History reviewed. No pertinent family history.  Social History   Tobacco Use  . Smoking status: Former Research scientist (life sciences)  . Smokeless tobacco: Never Used  Substance Use Topics  . Alcohol use: Not Currently  . Drug use: Never    Home Medications Prior to Admission medications   Medication Sig Start Date End Date Taking? Authorizing Provider  dexamethasone (DECADRON) 4 MG tablet Take 1 tablet (4 mg total) by mouth 2 (two) times daily. 06/18/19   Virgel Manifold, MD  oxyCODONE-acetaminophen (PERCOCET/ROXICET) 5-325 MG tablet Take 1 tablet by mouth every 8 (eight) hours as needed for severe pain. 06/18/19   Virgel Manifold, MD  promethazine (PHENERGAN) 25 MG tablet Take 1 tablet (25 mg total) by mouth every 6 (six) hours as needed for nausea or vomiting. 05/17/19  Amy Cater, PA-C    Allergies    Penicillins, Ciprofloxacin, and Red dye  Review of Systems   Review of Systems  Constitutional: Positive for appetite change and fever.  HENT: Negative for ear pain and sore throat.   Eyes: Negative for visual disturbance.  Respiratory: Positive for shortness of breath. Negative for cough.   Cardiovascular: Positive for chest pain. Negative for leg swelling.  Gastrointestinal: Positive for abdominal pain, diarrhea, nausea and vomiting. Negative for constipation.  Genitourinary: Negative for dysuria and hematuria.  Musculoskeletal: Negative for back pain.  Skin: Negative for color change and rash.  Neurological: Positive for syncope, speech  difficulty, weakness, light-headedness, numbness and headaches.  All other systems reviewed and are negative.   Physical Exam Updated Vital Signs BP 123/72 (BP Location: Right Arm)   Pulse 65   Temp 98.2 F (36.8 C) (Rectal)   Resp 16   Ht 5' 5.5" (1.664 m)   Wt 90.7 kg   SpO2 99%   BMI 32.78 kg/m   Physical Exam Vitals and nursing note reviewed.  Constitutional:      General: She is not in acute distress.    Appearance: She is well-developed.  HENT:     Head: Normocephalic and atraumatic.     Mouth/Throat:     Mouth: Mucous membranes are dry.     Comments: Lips are dry Eyes:     Extraocular Movements: Extraocular movements intact.     Conjunctiva/sclera: Conjunctivae normal.     Pupils: Pupils are equal, round, and reactive to light.  Cardiovascular:     Rate and Rhythm: Normal rate and regular rhythm.     Pulses: Normal pulses.     Heart sounds: Normal heart sounds. No murmur.  Pulmonary:     Effort: Pulmonary effort is normal. No respiratory distress.     Breath sounds: Normal breath sounds. No wheezing, rhonchi or rales.  Abdominal:     Palpations: Abdomen is soft.     Tenderness: There is no abdominal tenderness.  Musculoskeletal:     Cervical back: Neck supple.  Skin:    General: Skin is warm and dry.  Neurological:     Mental Status: She is alert.     Comments: Mental Status:  Alert, thought content appropriate, able to give a coherent history. Speech is somewhat slurred. Able to follow 2 step commands without difficulty.  Cranial Nerves:  II:  pupils equal, round, reactive to light III,IV, VI: ptosis not present, extra-ocular motions intact bilaterally  V,VII: smile symmetric, decreased sensation to the left side of the face VIII: hearing grossly normal to voice  X: uvula elevates symmetrically  XI: bilateral shoulder shrug symmetric and strong XII: midline tongue extension without fassiculations Motor:  Normal tone. 5/5 strength of BUE. 5/5 strength  to LUE. 4/5 strength to LLE.   Sensory: decreased sensation to the LUE/LLE Cerebellar: normal finger-to-nose with bilateral upper extremities Gait: not assessed due to safety (hypotensive)  CV: 2+ radial and DP pulses     ED Results / Procedures / Treatments   Labs (all labs ordered are listed, but only abnormal results are displayed) Labs Reviewed  CBC WITH DIFFERENTIAL/PLATELET - Abnormal; Notable for the following components:      Result Value   WBC 10.9 (*)    All other components within normal limits  COMPREHENSIVE METABOLIC PANEL - Abnormal; Notable for the following components:   CO2 13 (*)    Glucose, Bld 277 (*)    BUN 32 (*)  Creatinine, Ser 2.39 (*)    GFR calc non Af Amer 21 (*)    GFR calc Af Amer 25 (*)    Anion gap 16 (*)    All other components within normal limits  LIPASE, BLOOD - Abnormal; Notable for the following components:   Lipase 69 (*)    All other components within normal limits  URINALYSIS, ROUTINE W REFLEX MICROSCOPIC - Abnormal; Notable for the following components:   APPearance HAZY (*)    Protein, ur 30 (*)    All other components within normal limits  LACTIC ACID, PLASMA - Abnormal; Notable for the following components:   Lactic Acid, Venous 2.2 (*)    All other components within normal limits  LACTIC ACID, PLASMA - Abnormal; Notable for the following components:   Lactic Acid, Venous 2.8 (*)    All other components within normal limits  URINALYSIS, MICROSCOPIC (REFLEX) - Abnormal; Notable for the following components:   Bacteria, UA FEW (*)    All other components within normal limits  CBG MONITORING, ED - Abnormal; Notable for the following components:   Glucose-Capillary 196 (*)    All other components within normal limits  SARS CORONAVIRUS 2 AG (30 MIN TAT)  CULTURE, BLOOD (ROUTINE X 2)  CULTURE, BLOOD (ROUTINE X 2)  URINE CULTURE  C DIFFICILE QUICK SCREEN W PCR REFLEX  SARS CORONAVIRUS 2 (TAT 6-24 HRS)  APTT  PROTIME-INR    TROPONIN I (HIGH SENSITIVITY)  TROPONIN I (HIGH SENSITIVITY)    EKG None  Radiology CT ABDOMEN PELVIS WO CONTRAST  Result Date: 09/01/2019 CLINICAL DATA:  Abdominal pain with nausea, vomiting, and diarrhea. Fever. EXAM: CT ABDOMEN AND PELVIS WITHOUT CONTRAST TECHNIQUE: Multidetector CT imaging of the abdomen and pelvis was performed following the standard protocol without IV contrast. COMPARISON:  None. FINDINGS: Lower chest: Minimal linear scarring or atelectasis at the lung bases. Heart size is normal. Aortic atherosclerosis. Hepatobiliary: No focal liver abnormality is seen. No gallstones, gallbladder wall thickening, or biliary dilatation. Pancreas: Unremarkable. No pancreatic ductal dilatation or surrounding inflammatory changes. Spleen: Normal in size without focal abnormality. Adrenals/Urinary Tract: Right nephrectomy. Adrenal glands are normal. 3.7 cm cyst on the lower pole of the hypertrophied left kidney. No hydronephrosis. The bladder is empty. Stomach/Bowel: Stomach is within normal limits. Appendix appears normal. No evidence of bowel wall thickening, distention, or inflammatory changes. There are few diverticula in the ascending colon. The appendix is prominent but there are no inflammatory changes and there is no appendicolith. Vascular/Lymphatic: Aortic atherosclerosis. No enlarged abdominal or pelvic lymph nodes. Reproductive: Status post hysterectomy. No adnexal masses. Other: No abdominal wall hernia or abnormality. No abdominopelvic ascites. Musculoskeletal: No acute or significant osseous findings. IMPRESSION: Benign-appearing abdomen and pelvis. Electronically Signed   By: Lorriane Shire M.D.   On: 09/01/2019 17:50   CT Head Wo Contrast  Result Date: 09/01/2019 CLINICAL DATA:  Headache, fever, and left-sided weakness. EXAM: CT HEAD WITHOUT CONTRAST CT CERVICAL SPINE WITHOUT CONTRAST TECHNIQUE: Multidetector CT imaging of the head and cervical spine was performed following the  standard protocol without intravenous contrast. Multiplanar CT image reconstructions of the cervical spine were also generated. COMPARISON:  None. FINDINGS: CT HEAD FINDINGS Brain: No evidence of acute infarction, hemorrhage, hydrocephalus, extra-axial collection or mass lesion/mass effect. Vascular: No hyperdense vessel or unexpected calcification. Skull: Normal. Negative for fracture or focal lesion. Sinuses/Orbits: Normal. Other: None CT CERVICAL SPINE FINDINGS Alignment: Straightening of the cervical lordosis. 2 mm anterolisthesis of C3 on C4. Skull base  and vertebrae: No acute fracture. No primary bone lesion or focal pathologic process. Soft tissues and spinal canal: No prevertebral fluid or swelling. No visible canal hematoma. Disc levels:  C2-3: Negative. C3-4: Disc space narrowing. Minimal anterolisthesis. Small uncinate spurs extend into the left and right neural foraminal with slight left foraminal narrowing. No soft disc protrusions. C4-5: Disc space narrowing. Small broad-based disc osteophyte complex narrowing both lateral recesses. Moderate bilateral foraminal narrowing. C5-6: Disc space narrowing. Broad-based disc osteophyte complex with a central disc protrusion which narrows the AP diameter of the spinal canal. Moderate bilateral foraminal narrowing. C6-7: Disc space narrowing. Slight bilateral foraminal narrowing, right greater than left. C7-T1: Normal. Upper chest: Negative. Other: None IMPRESSION: 1. Normal CT scan of the head. 2. No acute abnormality of the cervical spine. Multilevel degenerative disc and joint disease. Electronically Signed   By: Lorriane Shire M.D.   On: 09/01/2019 17:43   CT Cervical Spine Wo Contrast  Result Date: 09/01/2019 CLINICAL DATA:  Headache, fever, and left-sided weakness. EXAM: CT HEAD WITHOUT CONTRAST CT CERVICAL SPINE WITHOUT CONTRAST TECHNIQUE: Multidetector CT imaging of the head and cervical spine was performed following the standard protocol without  intravenous contrast. Multiplanar CT image reconstructions of the cervical spine were also generated. COMPARISON:  None. FINDINGS: CT HEAD FINDINGS Brain: No evidence of acute infarction, hemorrhage, hydrocephalus, extra-axial collection or mass lesion/mass effect. Vascular: No hyperdense vessel or unexpected calcification. Skull: Normal. Negative for fracture or focal lesion. Sinuses/Orbits: Normal. Other: None CT CERVICAL SPINE FINDINGS Alignment: Straightening of the cervical lordosis. 2 mm anterolisthesis of C3 on C4. Skull base and vertebrae: No acute fracture. No primary bone lesion or focal pathologic process. Soft tissues and spinal canal: No prevertebral fluid or swelling. No visible canal hematoma. Disc levels:  C2-3: Negative. C3-4: Disc space narrowing. Minimal anterolisthesis. Small uncinate spurs extend into the left and right neural foraminal with slight left foraminal narrowing. No soft disc protrusions. C4-5: Disc space narrowing. Small broad-based disc osteophyte complex narrowing both lateral recesses. Moderate bilateral foraminal narrowing. C5-6: Disc space narrowing. Broad-based disc osteophyte complex with a central disc protrusion which narrows the AP diameter of the spinal canal. Moderate bilateral foraminal narrowing. C6-7: Disc space narrowing. Slight bilateral foraminal narrowing, right greater than left. C7-T1: Normal. Upper chest: Negative. Other: None IMPRESSION: 1. Normal CT scan of the head. 2. No acute abnormality of the cervical spine. Multilevel degenerative disc and joint disease. Electronically Signed   By: Lorriane Shire M.D.   On: 09/01/2019 17:43   DG Chest Portable 1 View  Result Date: 09/01/2019 CLINICAL DATA:  Chest pressure. Nausea, vomiting, diarrhea. Fever and weakness. EXAM: PORTABLE CHEST 1 VIEW COMPARISON:  None. FINDINGS: The heart size and mediastinal contours are within normal limits. Both lungs are clear. The visualized skeletal structures are unremarkable.  IMPRESSION: Normal exam. Electronically Signed   By: Lorriane Shire M.D.   On: 09/01/2019 17:50    Procedures Procedures (including critical care time) CRITICAL CARE Performed by: Rodney Booze   Total critical care time: 35 minutes  Critical care time was exclusive of separately billable procedures and treating other patients.  Critical care was necessary to treat or prevent imminent or life-threatening deterioration.  Critical care was time spent personally by me on the following activities: development of treatment plan with patient and/or surrogate as well as nursing, discussions with consultants, evaluation of patient's response to treatment, examination of patient, obtaining history from patient or surrogate, ordering and performing treatments and interventions,  ordering and review of laboratory studies, ordering and review of radiographic studies, pulse oximetry and re-evaluation of patient's condition.   Medications Ordered in ED Medications  aztreonam (AZACTAM) 1 g in sodium chloride 0.9 % 100 mL IVPB (has no administration in time range)  aztreonam (AZACTAM) 2 g injection (has no administration in time range)  atorvastatin (LIPITOR) tablet 40 mg (has no administration in time range)  QUEtiapine (SEROQUEL) tablet 200 mg (has no administration in time range)  prochlorperazine (COMPAZINE) tablet 5 mg (has no administration in time range)  insulin glargine (LANTUS) injection 60 Units (has no administration in time range)  insulin aspart (novoLOG) injection 0-9 Units (has no administration in time range)  sodium chloride 0.9 % bolus 1,000 mL (0 mLs Intravenous Stopped 09/01/19 2208)    And  sodium chloride 0.9 % bolus 1,000 mL (0 mLs Intravenous Stopped 09/01/19 1940)    And  sodium chloride 0.9 % bolus 1,000 mL (1,000 mLs Intravenous New Bag/Given 09/01/19 2207)  aztreonam (AZACTAM) 2 g in sodium chloride 0.9 % 100 mL IVPB (0 g Intravenous Stopped 09/01/19 1829)  metroNIDAZOLE  (FLAGYL) IVPB 500 mg ( Intravenous Stopped 09/01/19 1935)  ondansetron (ZOFRAN) injection 4 mg (4 mg Intravenous Given 09/01/19 1909)  ondansetron (ZOFRAN) injection 4 mg (4 mg Intravenous Given 09/01/19 2003)  acetaminophen (TYLENOL) suppository 650 mg (650 mg Rectal Given 09/01/19 2113)  dicyclomine (BENTYL) injection 20 mg (20 mg Intramuscular Given 09/01/19 2113)    ED Course  I have reviewed the triage vital signs and the nursing notes.  Pertinent labs & imaging results that were available during my care of the patient were reviewed by me and considered in my medical decision making (see chart for details).    MDM Rules/Calculators/A&P                      59 year old female presenting for evaluation of nausea, vomiting, diarrhea, abdominal cramping, generalized weakness and multiple syncopal episodes.  She also states that she has been weak on the left side for the last 4 days.  Patient with profound dehydration presenting with hypotension with blood pressure in the [id number] systolic.  Code sepsis was initiated and 30 cc/kg fluid bolus and broad-spectrum IV antibiotics were initiated to cover for potential intra-abdominal infection giving her recent nausea vomiting and diarrhea.   CBC with mild leukocytosis at 10.9, no anemia CMP with bicarb of 12, elevated blood glucose, elevated anion gap, elevated BUN/creatinine at 32 and 2.39 which is worse from prior. Lipase is marginally elevated Initial troponin negative Lactic acid elevated at 2.3, repeat lactic increased at 2.8 Coags are negative UA negative for UTI Point-of-care Covid negative  CT  abdomen/pelvis did not show any acute abnormalities in the abdomen.  She may be suffering from viral gastroenteritis however C. difficile was also ordered as had not yet been obtained as patient has been unable to provide a stool sample.  She does have a history of diverticulitis which she states she has been partially treated for by her PCP.  Additionally,  she had a head CT due to her left-sided weakness, multiple syncopal episodes and falls with possible head trauma.  CT scan did not show any obvious intracranial abnormalities or strokes however I am concerned for possible infarct given that she does have some left-sided weakness on exam.  Will admit for further treatment of AKI, dehydration, concern for new stroke. BP has improved following IVF administration and she is no longer hypotensive.  10:28 PM CONSULT With Dr. Hal Hope who accepts patient for admission. He is requesting neurology consult. He would like to be repaged if the patient has recurrent hypotension.   10:45 PM CONSULT with Dr. Leonel Ramsay who agrees to see the patient in consultation.    At shift change, made oncoming provider, Dr. Leonides Schanz aware of pt in the [facility]. Nightly home medications were ordered.   Final Clinical Impression(s) / ED Diagnoses Final diagnoses:  AKI (acute kidney injury) ([place])  Dehydration  Left-sided weakness    Rx / DC Orders ED Discharge Orders    None       Rodney Booze, PA-C 09/01/19 2355    Jacee Enerson, [place], PA-C 09/02/19 [id number]    Davonna Belling, MD 09/02/19 [phone]

## 2019-09-01 NOTE — Progress Notes (Signed)
Pharmacy Antibiotic Note  [facility]my [facility] is a 59 y.o. female admitted on 09/01/2019 with sepsis.  Pharmacy has been consulted for Aztreonam  dosing.  Plan: Aztreonam 1 gram iv Q 8 hours  Height: 5' 5.5" (166.4 cm) Weight: 200 lb (90.7 kg) IBW/kg (Calculated) : 58.15  Temp (24hrs), Avg:98.7 F (37.1 C), Min:98.7 F (37.1 C), Max:98.7 F (37.1 C)  Recent Labs  Lab 09/01/19 1524  WBC 10.9*  CREATININE 2.39*    Estimated Creatinine Clearance: 28.1 mL/min (A) (by C-G formula based on SCr of 2.39 mg/dL (H)).    Allergies  Allergen Reactions  . Penicillins Anaphylaxis and Rash    Other reaction(s): Unknown  . Ciprofloxacin Hives  . Red Dye Diarrhea    Other reaction(s): Unknown      Thank you for allowing pharmacy to be a part of this patient's care.  Tad Moore 09/01/2019 5:02 PM

## 2019-09-02 DIAGNOSIS — R531 Weakness: Secondary | ICD-10-CM

## 2019-09-02 LAB — URINE CULTURE: Culture: NO GROWTH

## 2019-09-02 LAB — CBG MONITORING, ED
Glucose-Capillary: 102 mg/dL — ABNORMAL HIGH (ref 70–99)
Glucose-Capillary: 121 mg/dL — ABNORMAL HIGH (ref 70–99)
Glucose-Capillary: 171 mg/dL — ABNORMAL HIGH (ref 70–99)
Glucose-Capillary: 89 mg/dL (ref 70–99)
Glucose-Capillary: 90 mg/dL (ref 70–99)

## 2019-09-02 LAB — SARS CORONAVIRUS 2 (TAT 6-24 HRS): SARS Coronavirus 2: NEGATIVE

## 2019-09-02 LAB — GLUCOSE, CAPILLARY: Glucose-Capillary: 123 mg/dL — ABNORMAL HIGH (ref 70–99)

## 2019-09-02 MED ORDER — SODIUM CHLORIDE 0.9 % IV SOLN: Freq: Once | INTRAVENOUS | Status: AC

## 2019-09-02 MED ORDER — MORPHINE SULFATE (PF) 4 MG/ML IV SOLN
4.0000 mg | Freq: Once | INTRAVENOUS | Status: AC
  Administered 2019-09-02: 19:00:00 4 mg via INTRAVENOUS
  Filled 2019-09-02: qty 1

## 2019-09-02 MED ORDER — ONDANSETRON HCL 4 MG/2ML IJ SOLN
4.0000 mg | Freq: Once | INTRAMUSCULAR | Status: AC
  Administered 2019-09-02: 15:00:00 4 mg via INTRAVENOUS
  Filled 2019-09-02: qty 2

## 2019-09-02 MED ORDER — AZTREONAM 1 G IJ SOLR
INTRAMUSCULAR | Status: AC
  Filled 2019-09-02: qty 1

## 2019-09-02 MED ORDER — HEPARIN SODIUM (PORCINE) 5000 UNIT/ML IJ SOLN
5000.0000 [IU] | Freq: Three times a day (TID) | INTRAMUSCULAR | Status: DC
  Administered 2019-09-03 – 2019-09-04 (×5): 5000 [IU] via SUBCUTANEOUS
  Filled 2019-09-02 (×5): qty 1

## 2019-09-02 MED ORDER — SODIUM CHLORIDE 0.9 % IV SOLN
INTRAVENOUS | Status: DC | PRN
  Administered 2019-09-02: 1000 mL via INTRAVENOUS
  Administered 2019-09-02: 500 mL via INTRAVENOUS

## 2019-09-02 MED ORDER — ACETAMINOPHEN 500 MG PO TABS
1000.0000 mg | ORAL_TABLET | Freq: Once | ORAL | Status: AC
  Administered 2019-09-02: 12:00:00 1000 mg via ORAL

## 2019-09-02 MED ORDER — FENTANYL CITRATE (PF) 100 MCG/2ML IJ SOLN
50.0000 ug | Freq: Once | INTRAMUSCULAR | Status: AC
  Administered 2019-09-02: 50 ug via INTRAVENOUS
  Filled 2019-09-02: qty 2

## 2019-09-02 MED ORDER — ACETAMINOPHEN 500 MG PO TABS
ORAL_TABLET | ORAL | Status: AC
  Filled 2019-09-02: qty 2

## 2019-09-02 NOTE — ED Notes (Signed)
Patient has had no stools reports as of this time this evening and am.

## 2019-09-02 NOTE — ED Notes (Signed)
CBG completed at times of orders per chart

## 2019-09-02 NOTE — ED Notes (Signed)
ED TO INPATIENT HANDOFF REPORT  ED Nurse Name and Phone #: Marisa Hua RN [phone]  S Name/Age/Gender Amy Nunez 59 y.o. female Room/Bed: MH08/MH08  Code Status   Code Status: Not on file  Home/SNF/Other Home Patient oriented to: self, place, time and situation Is this baseline? Yes   Triage Complete: Triage complete  Chief Complaint Left-sided weakness [R53.1]  Triage Note C/o n/v/d. H/a and fever x 1 week     Allergies Allergies  Allergen Reactions  . Penicillins Anaphylaxis and Rash    Other reaction(s): Unknown  . Ciprofloxacin Hives  . Red Dye Diarrhea    Other reaction(s): Unknown    Level of Care/Admitting Diagnosis ED Disposition    ED Disposition Condition Comment   Admit  Hospital Area: [facility] [[id number]]  Level of Care: Telemetry Medical [[id number]]  Covid Evaluation: Asymptomatic Screening Protocol (No Symptoms)  Diagnosis: Left-sided weakness [696789]  Admitting Physician: Rise Patience [phone]  Attending Physician: Rise Patience [phone]  Estimated length of stay: past midnight tomorrow  Certification:: I certify this patient will need inpatient services for at least 2 midnights       B Medical/Surgery History Past Medical History:  Diagnosis Date  . Blood transfusion without reported diagnosis   . Cancer [facility])    renal cancer  . Diabetes mellitus without complication ([place])   . Hypertension   . IBS (irritable bowel syndrome)    Past Surgical History:  Procedure Laterality Date  . ABDOMINAL HYSTERECTOMY    . ANKLE SURGERY    . NEPHRECTOMY Right      A IV Location/Drains/Wounds Patient Lines/Drains/Airways Status   Active Line/Drains/Airways    Name:   Placement date:   Placement time:   Site:   Days:   Peripheral IV 09/01/19 Left Hand   09/01/19    1746    Hand   1          Intake/Output Last 24 hours  Intake/Output Summary (Last 24 hours) at 09/02/2019 2002 Last data filed at 09/02/2019  1708 Gross per 24 hour  Intake 316.93 ml  Output --  Net 316.93 ml    Labs/Imaging Results for orders placed or performed during the hospital encounter of 09/01/19 (from the past 48 hour(s))  SARS Coronavirus 2 Ag (30 min TAT) - Nasal Swab (BD Veritor Kit)     Status: None   Collection Time: 09/01/19  3:24 PM   Specimen: Nasal Swab (BD Veritor Kit)  Result Value Ref Range   SARS Coronavirus 2 Ag NEGATIVE NEGATIVE    Comment: (NOTE) SARS-CoV-2 antigen NOT DETECTED.  Negative results are presumptive.  Negative results do not preclude SARS-CoV-2 infection and should not be used as the sole basis for treatment or other patient management decisions, including infection  control decisions, particularly in the presence of clinical signs and  symptoms consistent with COVID-19, or in those who have been in contact with the virus.  Negative results must be combined with clinical observations, patient history, and epidemiological information. The expected result is Negative. Fact Sheet for Patients: PodPark.tn Fact Sheet for Healthcare Providers: GiftContent.is This test is not yet approved or cleared by the [place] FDA and  has been authorized for detection and/or diagnosis of SARS-CoV-2 by FDA under an Emergency Use Authorization (EUA).  This EUA will remain in effect (meaning this test can be used) for the duration of  the COVID-19 de claration under Section 564(b)(1) of the Act, 21 U.S.C. section 360bbb-3(b)(1),  unless the authorization is terminated or revoked sooner. Performed at [facility], [address] [place], [place] [address]   CBC with Differential     Status: Abnormal   Collection Time: 09/01/19  3:24 PM  Result Value Ref Range   WBC 10.9 (H) 4.0 - 10.5 K/uL   RBC 4.41 3.87 - 5.11 MIL/uL   Hemoglobin 14.0 12.0 - 15.0 g/dL   HCT 42.0 36.0 - 46.0 %   MCV 95.2 80.0 - 100.0 fL   MCH 31.7 26.0 -  34.0 pg   MCHC 33.3 30.0 - 36.0 g/dL   RDW 13.0 11.5 - 15.5 %   Platelets 397 150 - 400 K/uL   nRBC 0.0 0.0 - 0.2 %   Neutrophils Relative % 69 %   Neutro Abs 7.5 1.7 - 7.7 K/uL   Lymphocytes Relative 25 %   Lymphs Abs 2.7 0.7 - 4.0 K/uL   Monocytes Relative 5 %   Monocytes Absolute 0.6 0.1 - 1.0 K/uL   Eosinophils Relative 1 %   Eosinophils Absolute 0.1 0.0 - 0.5 K/uL   Basophils Relative 0 %   Basophils Absolute 0.0 0.0 - 0.1 K/uL   Immature Granulocytes 0 %   Abs Immature Granulocytes 0.02 0.00 - 0.07 K/uL    Comment: Performed at [facility], [address] [place], [place] [address]  Comprehensive metabolic panel     Status: Abnormal   Collection Time: 09/01/19  3:24 PM  Result Value Ref Range   Sodium 135 135 - 145 mmol/L   Potassium 5.0 3.5 - 5.1 mmol/L   Chloride 106 98 - 111 mmol/L   CO2 13 (L) 22 - 32 mmol/L   Glucose, Bld 277 (H) 70 - 99 mg/dL   BUN 32 (H) 6 - 20 mg/dL   Creatinine, Ser 2.39 (H) 0.44 - 1.00 mg/dL   Calcium 9.3 8.9 - 10.3 mg/dL   Total Protein 7.9 6.5 - 8.1 g/dL   Albumin 4.4 3.5 - 5.0 g/dL   AST 20 15 - 41 U/L   ALT 18 0 - 44 U/L   Alkaline Phosphatase 111 38 - 126 U/L   Total Bilirubin 0.5 0.3 - 1.2 mg/dL   GFR calc non Af Amer 21 (L) >60 mL/min   GFR calc Af Amer 25 (L) >60 mL/min   Anion gap 16 (H) 5 - 15    Comment: Performed at [facility], [address] [place], [place] [address]  Lipase, blood     Status: Abnormal   Collection Time: 09/01/19  3:24 PM  Result Value Ref Range   Lipase 69 (H) 11 - 51 U/L    Comment: Performed at [facility], [address] [place], [place] [address]  CBG monitoring, ED     Status: Abnormal   Collection Time: 09/01/19  4:38 PM  Result Value Ref Range   Glucose-Capillary 196 (H) 70 - 99 mg/dL  Troponin I (High Sensitivity)     Status: None   Collection Time: 09/01/19  4:54 PM  Result Value Ref Range   Troponin I (High Sensitivity) 7 <18 ng/L    Comment:  (NOTE) Elevated high sensitivity troponin I (hsTnI) values and significant  changes across serial measurements may suggest ACS but many other  chronic and acute conditions are known to elevate hsTnI results.  Refer to the "Links" section for chest pain algorithms and additional  guidance. Performed at [facility]  [address], [address] [place], [place] [address]   Blood Culture (routine x 2)     Status: None (Preliminary result)   Collection Time: 09/01/19  5:45 PM   Specimen: BLOOD LEFT HAND  Result Value Ref Range   Specimen Description      BLOOD LEFT HAND Performed at [facility], [address] [place], [place] [address]    Special Requests      BOTTLES DRAWN AEROBIC AND ANAEROBIC Blood Culture adequate volume Performed at [facility], [address] [place], [place] [address]    Culture      NO GROWTH < 24 HOURS Performed at [facility], [place] [address] [place], [place] [address]    Report Status PENDING   SARS CORONAVIRUS 2 (TAT 6-24 HRS) Nasopharyngeal Nasopharyngeal Swab     Status: None   Collection Time: 09/01/19  6:04 PM   Specimen: Nasopharyngeal Swab  Result Value Ref Range   SARS Coronavirus 2 NEGATIVE NEGATIVE    Comment: (NOTE) SARS-CoV-2 target nucleic acids are NOT DETECTED. The SARS-CoV-2 RNA is generally detectable in upper and lower respiratory specimens during the acute phase of infection. Negative results do not preclude SARS-CoV-2 infection, do not rule out co-infections with other pathogens, and should not be used as the sole basis for treatment or other patient management decisions. Negative results must be combined with clinical observations, patient history, and epidemiological information. The expected result is Negative. Fact Sheet for Patients: SugarRoll.be Fact Sheet for Healthcare Providers: https://www.woods-mathews.com/ This test is not yet approved or  cleared by the [place] FDA and  has been authorized for detection and/or diagnosis of SARS-CoV-2 by FDA under an Emergency Use Authorization (EUA). This EUA will remain  in effect (meaning this test can be used) for the duration of the COVID-19 declaration under Section 56 4(b)(1) of the Act, 21 U.S.C. section 360bbb-3(b)(1), unless the authorization is terminated or revoked sooner. Performed at [facility], [place] [address], [place], [place] [address]   Lactic acid, plasma     Status: Abnormal   Collection Time: 09/01/19  6:15 PM  Result Value Ref Range   Lactic Acid, Venous 2.2 (HH) 0.5 - 1.9 mmol/L    Comment: CRITICAL RESULT CALLED TO, READ BACK BY AND VERIFIED WITH: MARVA SIMMS @ 1841 ON 09/01/2019, [email or web address] Performed at [facility], [address] [place], [place] [address]   Urinalysis, Routine w reflex microscopic     Status: Abnormal   Collection Time: 09/01/19  8:05 PM  Result Value Ref Range   Color, Urine YELLOW YELLOW   APPearance HAZY (A) CLEAR   Specific Gravity, Urine 1.025 1.005 - 1.030   pH 5.5 5.0 - 8.0   Glucose, UA NEGATIVE NEGATIVE mg/dL   Hgb urine dipstick NEGATIVE NEGATIVE   Bilirubin Urine NEGATIVE NEGATIVE   Ketones, ur NEGATIVE NEGATIVE mg/dL   Protein, ur 30 (A) NEGATIVE mg/dL   Nitrite NEGATIVE NEGATIVE   Leukocytes,Ua NEGATIVE NEGATIVE    Comment: Performed at [facility], [address] [place], [place] [address]  Lactic acid, plasma     Status: Abnormal   Collection Time: 09/01/19  8:05 PM  Result Value Ref Range   Lactic Acid, Venous 2.8 (HH) 0.5 - 1.9 mmol/L    Comment: CRITICAL RESULT CALLED TO, READ BACK BY AND VERIFIED WITH: LISA A. RN AT 2040 ON 09/01/19 BY [email or web address] Performed at [facility]  [address], [address] [place], [place] [address]   APTT     Status: None   Collection Time: 09/01/19  8:05 PM  Result Value Ref Range   aPTT 30 24 - 36 seconds    Comment: Performed at [facility], [address] [place], [place] [address]  Protime-INR     Status: None   Collection Time: 09/01/19  8:05 PM  Result Value Ref Range   Prothrombin Time 13.5 11.4 - 15.2 seconds   INR 1.0 0.8 - 1.2    Comment: (NOTE) INR goal varies based on device and disease states. Performed at [facility], [address] [place], [place] [address]   Blood Culture (routine x 2)     Status: None (Preliminary result)   Collection Time: 09/01/19  8:05 PM   Specimen: BLOOD  Result Value Ref Range   Specimen Description      BLOOD BLOOD RIGHT FOREARM Performed at [facility], [address] [place], [place] [address]    Special Requests      BOTTLES DRAWN AEROBIC AND ANAEROBIC Blood Culture adequate volume Performed at [facility], [address] [place], [place] [address]    Culture      NO GROWTH < 12 HOURS Performed at [facility], [place] [address] [place], [place] [address]    Report Status PENDING   Troponin I (High Sensitivity)     Status: None   Collection Time: 09/01/19  8:05 PM  Result Value Ref Range   Troponin I (High Sensitivity) 7 <18 ng/L    Comment: (NOTE) Elevated high sensitivity troponin I (hsTnI) values and significant  changes across serial measurements may suggest ACS but many other  chronic and acute conditions are known to elevate hsTnI results.  Refer to the "Links" section for chest pain algorithms and additional  guidance. Performed at [facility], [address] [place], [place] [address]   Urinalysis, Microscopic (reflex)     Status: Abnormal   Collection Time: 09/01/19  8:05 PM  Result Value Ref Range   RBC / HPF 0-5 0 - 5 RBC/hpf   WBC, UA 0-5 0 - 5 WBC/hpf   Bacteria, UA FEW (A) NONE SEEN   Squamous Epithelial / LPF 6-10 0 - 5   Mucus PRESENT    Hyaline Casts, UA PRESENT     Comment: Performed at [facility], [address] [place], [place] [address]  CBG monitoring, ED      Status: None   Collection Time: 09/02/19 12:30 AM  Result Value Ref Range   Glucose-Capillary 90 70 - 99 mg/dL  CBG monitoring, ED     Status: Abnormal   Collection Time: 09/02/19  6:45 AM  Result Value Ref Range   Glucose-Capillary 121 (H) 70 - 99 mg/dL  CBG monitoring, ED     Status: Abnormal   Collection Time: 09/02/19  8:39 AM  Result Value Ref Range   Glucose-Capillary 102 (H) 70 - 99 mg/dL   Comment 1 Notify RN   CBG monitoring, ED     Status: Abnormal   Collection Time: 09/02/19 12:33 PM  Result Value Ref Range   Glucose-Capillary 171 (H) 70 - 99 mg/dL   Comment 1 Notify RN   CBG monitoring, ED     Status: None   Collection Time: 09/02/19  5:28 PM  Result Value Ref  Range   Glucose-Capillary 89 70 - 99 mg/dL   CT ABDOMEN PELVIS WO CONTRAST  Result Date: 09/01/2019 CLINICAL DATA:  Abdominal pain with nausea, vomiting, and diarrhea. Fever. EXAM: CT ABDOMEN AND PELVIS WITHOUT CONTRAST TECHNIQUE: Multidetector CT imaging of the abdomen and pelvis was performed following the standard protocol without IV contrast. COMPARISON:  None. FINDINGS: Lower chest: Minimal linear scarring or atelectasis at the lung bases. Heart size is normal. Aortic atherosclerosis. Hepatobiliary: No focal liver abnormality is seen. No gallstones, gallbladder wall thickening, or biliary dilatation. Pancreas: Unremarkable. No pancreatic ductal dilatation or surrounding inflammatory changes. Spleen: Normal in size without focal abnormality. Adrenals/Urinary Tract: Right nephrectomy. Adrenal glands are normal. 3.7 cm cyst on the lower pole of the hypertrophied left kidney. No hydronephrosis. The bladder is empty. Stomach/Bowel: Stomach is within normal limits. Appendix appears normal. No evidence of bowel wall thickening, distention, or inflammatory changes. There are few diverticula in the ascending colon. The appendix is prominent but there are no inflammatory changes and there is no appendicolith. Vascular/Lymphatic:  Aortic atherosclerosis. No enlarged abdominal or pelvic lymph nodes. Reproductive: Status post hysterectomy. No adnexal masses. Other: No abdominal wall hernia or abnormality. No abdominopelvic ascites. Musculoskeletal: No acute or significant osseous findings. IMPRESSION: Benign-appearing abdomen and pelvis. Electronically Signed   By: Lorriane Shire M.D.   On: 09/01/2019 17:50   CT Head Wo Contrast  Result Date: 09/01/2019 CLINICAL DATA:  Headache, fever, and left-sided weakness. EXAM: CT HEAD WITHOUT CONTRAST CT CERVICAL SPINE WITHOUT CONTRAST TECHNIQUE: Multidetector CT imaging of the head and cervical spine was performed following the standard protocol without intravenous contrast. Multiplanar CT image reconstructions of the cervical spine were also generated. COMPARISON:  None. FINDINGS: CT HEAD FINDINGS Brain: No evidence of acute infarction, hemorrhage, hydrocephalus, extra-axial collection or mass lesion/mass effect. Vascular: No hyperdense vessel or unexpected calcification. Skull: Normal. Negative for fracture or focal lesion. Sinuses/Orbits: Normal. Other: None CT CERVICAL SPINE FINDINGS Alignment: Straightening of the cervical lordosis. 2 mm anterolisthesis of C3 on C4. Skull base and vertebrae: No acute fracture. No primary bone lesion or focal pathologic process. Soft tissues and spinal canal: No prevertebral fluid or swelling. No visible canal hematoma. Disc levels:  C2-3: Negative. C3-4: Disc space narrowing. Minimal anterolisthesis. Small uncinate spurs extend into the left and right neural foraminal with slight left foraminal narrowing. No soft disc protrusions. C4-5: Disc space narrowing. Small broad-based disc osteophyte complex narrowing both lateral recesses. Moderate bilateral foraminal narrowing. C5-6: Disc space narrowing. Broad-based disc osteophyte complex with a central disc protrusion which narrows the AP diameter of the spinal canal. Moderate bilateral foraminal narrowing. C6-7:  Disc space narrowing. Slight bilateral foraminal narrowing, right greater than left. C7-T1: Normal. Upper chest: Negative. Other: None IMPRESSION: 1. Normal CT scan of the head. 2. No acute abnormality of the cervical spine. Multilevel degenerative disc and joint disease. Electronically Signed   By: Lorriane Shire M.D.   On: 09/01/2019 17:43   CT Cervical Spine Wo Contrast  Result Date: 09/01/2019 CLINICAL DATA:  Headache, fever, and left-sided weakness. EXAM: CT HEAD WITHOUT CONTRAST CT CERVICAL SPINE WITHOUT CONTRAST TECHNIQUE: Multidetector CT imaging of the head and cervical spine was performed following the standard protocol without intravenous contrast. Multiplanar CT image reconstructions of the cervical spine were also generated. COMPARISON:  None. FINDINGS: CT HEAD FINDINGS Brain: No evidence of acute infarction, hemorrhage, hydrocephalus, extra-axial collection or mass lesion/mass effect. Vascular: No hyperdense vessel or unexpected calcification. Skull: Normal. Negative for fracture or focal lesion.  Sinuses/Orbits: Normal. Other: None CT CERVICAL SPINE FINDINGS Alignment: Straightening of the cervical lordosis. 2 mm anterolisthesis of C3 on C4. Skull base and vertebrae: No acute fracture. No primary bone lesion or focal pathologic process. Soft tissues and spinal canal: No prevertebral fluid or swelling. No visible canal hematoma. Disc levels:  C2-3: Negative. C3-4: Disc space narrowing. Minimal anterolisthesis. Small uncinate spurs extend into the left and right neural foraminal with slight left foraminal narrowing. No soft disc protrusions. C4-5: Disc space narrowing. Small broad-based disc osteophyte complex narrowing both lateral recesses. Moderate bilateral foraminal narrowing. C5-6: Disc space narrowing. Broad-based disc osteophyte complex with a central disc protrusion which narrows the AP diameter of the spinal canal. Moderate bilateral foraminal narrowing. C6-7: Disc space narrowing. Slight  bilateral foraminal narrowing, right greater than left. C7-T1: Normal. Upper chest: Negative. Other: None IMPRESSION: 1. Normal CT scan of the head. 2. No acute abnormality of the cervical spine. Multilevel degenerative disc and joint disease. Electronically Signed   By: Lorriane Shire M.D.   On: 09/01/2019 17:43   DG Chest Portable 1 View  Result Date: 09/01/2019 CLINICAL DATA:  Chest pressure. Nausea, vomiting, diarrhea. Fever and weakness. EXAM: PORTABLE CHEST 1 VIEW COMPARISON:  None. FINDINGS: The heart size and mediastinal contours are within normal limits. Both lungs are clear. The visualized skeletal structures are unremarkable. IMPRESSION: Normal exam. Electronically Signed   By: Lorriane Shire M.D.   On: 09/01/2019 17:50    Pending Labs Unresulted Labs (From admission, onward)    Start     Ordered   09/01/19 1655  C difficile quick scan w PCR reflex  (C Difficile quick screen w PCR reflex panel)  Once, for 24 hours,   STAT     09/01/19 1656   09/01/19 1654  Urine culture  ONCE - STAT,   STAT     09/01/19 1656          Vitals/Pain Today's Vitals   09/02/19 1700 09/02/19 1813 09/02/19 1830 09/02/19 1930  BP: 112/77 131/82 121/83 133/78  Pulse:  70 72 68  Resp: 16 11 12 20   Temp:      TempSrc:      SpO2: 99% 99% 100% 100%  Weight:      Height:      PainSc:   8  3     Isolation Precautions Enteric precautions (UV disinfection)  Medications Medications  aztreonam (AZACTAM) 1 g in sodium chloride 0.9 % 100 mL IVPB ( Intravenous Stopped 09/02/19 1658)  aztreonam (AZACTAM) 2 g injection (has no administration in time range)  atorvastatin (LIPITOR) tablet 40 mg (has no administration in time range)  QUEtiapine (SEROQUEL) tablet 200 mg (200 mg Oral Given 09/01/19 2342)  insulin glargine (LANTUS) injection 60 Units (0 Units Subcutaneous Hold 09/02/19 0100)  insulin aspart (novoLOG) injection 0-9 Units (0 Units Subcutaneous Not Given 09/02/19 1837)  aztreonam (AZACTAM) 1 g injection  (has no administration in time range)  0.9 %  sodium chloride infusion (500 mLs Intravenous New Bag/Given 09/02/19 0035)  aztreonam (AZACTAM) 1 g injection (has no administration in time range)  aztreonam (AZACTAM) 1 g injection (has no administration in time range)  sodium chloride 0.9 % bolus 1,000 mL (0 mLs Intravenous Stopped 09/01/19 2208)    And  sodium chloride 0.9 % bolus 1,000 mL (0 mLs Intravenous Stopped 09/01/19 1940)    And  sodium chloride 0.9 % bolus 1,000 mL (0 mLs Intravenous Stopped 09/02/19 0020)  aztreonam (AZACTAM) 2 g in  sodium chloride 0.9 % 100 mL IVPB (0 g Intravenous Stopped 09/01/19 1829)  metroNIDAZOLE (FLAGYL) IVPB 500 mg ( Intravenous Stopped 09/01/19 1935)  ondansetron (ZOFRAN) injection 4 mg (4 mg Intravenous Given 09/01/19 1909)  ondansetron (ZOFRAN) injection 4 mg (4 mg Intravenous Given 09/01/19 2003)  acetaminophen (TYLENOL) suppository 650 mg (650 mg Rectal Given 09/01/19 2113)  dicyclomine (BENTYL) injection 20 mg (20 mg Intramuscular Given 09/01/19 2113)  prochlorperazine (COMPAZINE) tablet 5 mg (5 mg Oral Given 09/01/19 2347)  0.9 %  sodium chloride infusion ( Intravenous New Bag/Given 09/02/19 1058)  acetaminophen (TYLENOL) tablet 1,000 mg (1,000 mg Oral Given 09/02/19 1224)  fentaNYL (SUBLIMAZE) injection 50 mcg (50 mcg Intravenous Given 09/02/19 1513)  ondansetron (ZOFRAN) injection 4 mg (4 mg Intravenous Given 09/02/19 1513)  morphine 4 MG/ML injection 4 mg (4 mg Intravenous Given 09/02/19 1851)    Mobility walks Low fall risk   Focused Assessments n/a   R Recommendations: See Admitting Provider Note  Report given to:   Additional Notes:

## 2019-09-02 NOTE — Consult Note (Signed)
Neurology Consultation Reason for Consult: Left-sided weakness Referring Physician: Ward, K  CC: Left-sided weakness  History is obtained from: Patient  HPI: Amy Nunez is a 59 y.o. female who has been having multiple bowel movements with hypotension and dizziness for multiple days.  She has a history of IBS.  Over the past few days, she has noticed that her left side is weak.  She has a history of a stroke causing left-sided weakness and this is similar to when she previously had her stroke, it is worse than typical.  She endorses some numbness type sensation as well.  She denies vision change.  Due to her left-sided weakness, neurology has been consulted for possible stroke.  LKW: Thursday tpa given?: no, outside of window    ROS: A 14 point ROS was performed and is negative except as noted in the HPI.   Past Medical History:  Diagnosis Date  . Blood transfusion without reported diagnosis   . Cancer [facility])    renal cancer  . Diabetes mellitus without complication ([place])   . Hypertension   . IBS (irritable bowel syndrome)      History reviewed. No pertinent family history.   Social History:  reports that she has quit smoking. She has never used smokeless tobacco. She reports previous alcohol use. She reports that she does not use drugs.   Exam: Current vital signs: BP 140/84 (BP Location: Left Arm)   Pulse 61   Temp 98.8 F (37.1 C) (Oral)   Resp 16   Ht 5' 5.5" (1.664 m)   Wt 90.7 kg   SpO2 100%   BMI 32.78 kg/m  Vital signs in last 24 hours: Temp:  [98.8 F (37.1 C)] 98.8 F (37.1 C) (01/05 2129) Pulse Rate:  [58-92] 61 (01/05 2129) Resp:  [9-25] 16 (01/05 2129) BP: (86-140)/(58-93) 140/84 (01/05 2129) SpO2:  [95 %-100 %] 100 % (01/05 2129)   Physical Exam  Constitutional: Appears well-developed and well-nourished.  Psych: Affect appropriate to situation Eyes: No scleral injection HENT: No OP obstrucion MSK: no joint deformities.  Cardiovascular:  Normal rate and regular rhythm.  Respiratory: Effort normal, non-labored breathing GI: Soft.  No distension. There is no tenderness.  Skin: WDI  Neuro: Mental Status: Patient is awake, alert, oriented to person, place, month, year, and situation. Patient is able to give a clear and coherent history. No signs of aphasia or neglect Cranial Nerves: II: Visual Fields are full. Pupils are equal, round, and reactive to light.   III,IV, VI: EOMI without ptosis or diploplia.  V: Facial sensation is symmetric to temperature VII: Facial movement is mildly diminished on the left VIII: hearing is intact to voice X: Uvula elevates symmetrically XI: Shoulder shrug is symmetric. XII: tongue is midline without atrophy or fasciculations.  Motor: Tone is normal. Bulk is normal.  She has 4/5 weakness of the left arm to confrontation and 5/5 strength in bilateral legs and the right arm.  She does not have drift in the left arm. Sensory: Sensation is symmetric to light touch and temperature in the arms and legs. Cerebellar: FNF and HKS are intact bilaterally   I have reviewed labs in epic and the results pertinent to this consultation are: Creatinine 2.39, AKI given last creatinine of 1.15 Lactate of 2.2 Mild leukocytosis at 10.9 (upper limit of normal is 10.5)  I have reviewed the images obtained: CT head/cervical spine-negative  Impression: 59 year old female with left-sided weakness in the setting of gastrointestinal disturbance, hypotension and  previous stroke.  I suspect that this is most likely recrudescence of previous symptoms rather than acute stroke, but cannot rule out acute stroke without MRI.  Recommendations: -MRI brain -Aspirin if not contraindicated from a GI perspective. -Stroke work-up if positive   Roland Rack, MD [facility] [phone]  If 7pm- 7am, please page neurology on call as listed in [place].

## 2019-09-03 ENCOUNTER — Inpatient Hospital Stay (HOSPITAL_COMMUNITY): Payer: Medicare HMO

## 2019-09-03 DIAGNOSIS — R109 Unspecified abdominal pain: Secondary | ICD-10-CM | POA: Diagnosis present

## 2019-09-03 DIAGNOSIS — G459 Transient cerebral ischemic attack, unspecified: Secondary | ICD-10-CM

## 2019-09-03 DIAGNOSIS — E119 Type 2 diabetes mellitus without complications: Secondary | ICD-10-CM

## 2019-09-03 DIAGNOSIS — I10 Essential (primary) hypertension: Secondary | ICD-10-CM | POA: Diagnosis present

## 2019-09-03 DIAGNOSIS — K589 Irritable bowel syndrome without diarrhea: Secondary | ICD-10-CM | POA: Diagnosis present

## 2019-09-03 DIAGNOSIS — R531 Weakness: Secondary | ICD-10-CM

## 2019-09-03 LAB — CBC
HCT: 35.6 % — ABNORMAL LOW (ref 36.0–46.0)
Hemoglobin: 12.1 g/dL (ref 12.0–15.0)
MCH: 32.3 pg (ref 26.0–34.0)
MCHC: 34 g/dL (ref 30.0–36.0)
MCV: 94.9 fL (ref 80.0–100.0)
Platelets: 306 10*3/uL (ref 150–400)
RBC: 3.75 MIL/uL — ABNORMAL LOW (ref 3.87–5.11)
RDW: 12.9 % (ref 11.5–15.5)
WBC: 9.1 10*3/uL (ref 4.0–10.5)
nRBC: 0 % (ref 0.0–0.2)

## 2019-09-03 LAB — BASIC METABOLIC PANEL
Anion gap: 4 — ABNORMAL LOW (ref 5–15)
BUN: 19 mg/dL (ref 6–20)
CO2: 20 mmol/L — ABNORMAL LOW (ref 22–32)
Calcium: 8.6 mg/dL — ABNORMAL LOW (ref 8.9–10.3)
Chloride: 113 mmol/L — ABNORMAL HIGH (ref 98–111)
Creatinine, Ser: 1.17 mg/dL — ABNORMAL HIGH (ref 0.44–1.00)
GFR calc Af Amer: 59 mL/min — ABNORMAL LOW (ref >60)
GFR calc non Af Amer: 51 mL/min — ABNORMAL LOW (ref >60)
Glucose, Bld: 144 mg/dL — ABNORMAL HIGH (ref 70–99)
Potassium: 5.5 mmol/L — ABNORMAL HIGH (ref 3.5–5.1)
Sodium: 137 mmol/L (ref 135–145)

## 2019-09-03 LAB — POTASSIUM: Potassium: 4.7 mmol/L (ref 3.5–5.1)

## 2019-09-03 LAB — ECHOCARDIOGRAM COMPLETE
Height: 65.5 in
Weight: 3200 oz

## 2019-09-03 LAB — GLUCOSE, CAPILLARY
Glucose-Capillary: 45 mg/dL — ABNORMAL LOW (ref 70–99)
Glucose-Capillary: 88 mg/dL (ref 70–99)
Glucose-Capillary: 165 mg/dL — ABNORMAL HIGH (ref 70–99)
Glucose-Capillary: 87 mg/dL (ref 70–99)
Glucose-Capillary: 145 mg/dL — ABNORMAL HIGH (ref 70–99)

## 2019-09-03 LAB — HEMOGLOBIN A1C
Hgb A1c MFr Bld: 8.4 % — ABNORMAL HIGH (ref 4.8–5.6)
Mean Plasma Glucose: 194.38 mg/dL

## 2019-09-03 LAB — LIPID PANEL
Cholesterol: 151 mg/dL (ref 0–200)
HDL: 32 mg/dL — ABNORMAL LOW (ref >40)
LDL Cholesterol: 100 mg/dL — ABNORMAL HIGH (ref 0–99)
Total CHOL/HDL Ratio: 4.7 RATIO
Triglycerides: 94 mg/dL (ref <150)
VLDL: 19 mg/dL (ref 0–40)

## 2019-09-03 LAB — CREATININE, SERUM
Creatinine, Ser: 1.38 mg/dL — ABNORMAL HIGH (ref 0.44–1.00)
GFR calc Af Amer: 48 mL/min — ABNORMAL LOW (ref >60)
GFR calc non Af Amer: 41 mL/min — ABNORMAL LOW (ref >60)

## 2019-09-03 LAB — HIV ANTIBODY (ROUTINE TESTING W REFLEX): HIV Screen 4th Generation wRfx: NONREACTIVE

## 2019-09-03 MED ORDER — ACETAMINOPHEN 160 MG/5ML PO SOLN: 650.0000 mg | ORAL | Status: DC | PRN

## 2019-09-03 MED ORDER — STROKE: EARLY STAGES OF RECOVERY BOOK
Freq: Once | Status: AC
  Filled 2019-09-03: qty 1

## 2019-09-03 MED ORDER — ACETAMINOPHEN 650 MG RE SUPP: 650.0000 mg | RECTAL | Status: DC | PRN

## 2019-09-03 MED ORDER — TRAMADOL HCL 50 MG PO TABS
100.0000 mg | ORAL_TABLET | Freq: Four times a day (QID) | ORAL | Status: DC | PRN
  Administered 2019-09-04: 100 mg via ORAL
  Filled 2019-09-03: qty 2

## 2019-09-03 MED ORDER — SENNOSIDES-DOCUSATE SODIUM 8.6-50 MG PO TABS: 1.0000 | ORAL_TABLET | Freq: Every evening | ORAL | Status: DC | PRN

## 2019-09-03 MED ORDER — SODIUM CHLORIDE 0.9 % IV SOLN: INTRAVENOUS | Status: DC

## 2019-09-03 MED ORDER — TRAMADOL HCL 50 MG PO TABS
100.0000 mg | ORAL_TABLET | Freq: Once | ORAL | Status: AC
  Administered 2019-09-03: 100 mg via ORAL
  Filled 2019-09-03: qty 2

## 2019-09-03 MED ORDER — ENOXAPARIN SODIUM 40 MG/0.4ML ~~LOC~~ SOLN: 40.0000 mg | SUBCUTANEOUS | Status: DC

## 2019-09-03 MED ORDER — ACETAMINOPHEN 325 MG PO TABS
650.0000 mg | ORAL_TABLET | ORAL | Status: DC | PRN
  Administered 2019-09-03 (×2): 650 mg via ORAL
  Filled 2019-09-03 (×2): qty 2

## 2019-09-03 NOTE — Progress Notes (Signed)
Patient seen and examined at bedside, patient admitted after midnight, please see earlier detailed admission note by Rise Patience, MD. Briefly, patient presented with left sided weakness with concern for stroke. Workup negative. Possibly secondary to complicated migraine vs recrudescence of previous symptoms per neurology assessment. Patient with continued headache  General exam: Appears calm and comfortable Respiratory system: Clear to auscultation. Respiratory effort normal. Cardiovascular system: S1 & S2 heard, RRR. No murmurs, rubs, gallops or clicks. Gastrointestinal system: Abdomen is nondistended, soft and nontender. No organomegaly or masses felt. Normal bowel sounds heard. Central nervous system: Alert and oriented. Extremities: No edema. No calf tenderness Skin: No cyanosis. No rashes Psychiatry: Judgement and insight appear normal. Mood & affect appropriate.   Weakness Neurology recommendations pending PT recommending home health and rolling walker  Headache Tramadol x1. May consider Toradol if no improvement  AKI Creatinine trending down with IV fluids. Will continue for another 24 hours   Cordelia Poche, MD [facility] 09/03/2019, 6:12 PM

## 2019-09-03 NOTE — Evaluation (Signed)
Physical Therapy Evaluation Patient Details Name: Amy Nunez MRN: [id number] DOB: [birth year] Today's Date: 09/03/2019   History of Present Illness  Amy Nunez is a 59 y.o. female with medical history significant of Diabetes, HTN, IBS. Admitted 1/4 with abdominal pain and left sided weakness. Awaiting MRI results.  Clinical Impression  Pt was seen for mobility and noted her L knee and HA are painful and distracting, creating limitations that may make her a higher fall risk.  Have recommended an AD for pt to manage her pain, and will also have HHPT follow for control of knee effusion and pain, to increase LLE strength as well as work on safety with gait to manage her changes of knee that have restricted her tolerance for WB on L leg.  See acutely for same, with focus on safety of gait.    Follow Up Recommendations Home health PT;Supervision for mobility/OOB    Equipment Recommendations  Rolling walker with 5" wheels    Recommendations for Other Services       Precautions / Restrictions Precautions Precautions: Fall Precaution Comments: Lknee effusion s/p fall Restrictions Weight Bearing Restrictions: No      Mobility  Bed Mobility Overal bed mobility: Independent                Transfers Overall transfer level: Modified independent Equipment used: None Transfers: Sit to/from Stand           General transfer comment: supervised but could stand alone  Ambulation/Gait Ambulation/Gait assistance: Min guard Gait Distance (Feet): 110 Feet Assistive device: 1 person hand held assist Gait Pattern/deviations: Step-through pattern;Wide base of support;Decreased weight shift to left Gait velocity: reduced Gait velocity interpretation: <1.31 ft/sec, indicative of household ambulator General Gait Details: minimal wb on L leg due to fall on L knee when she had syncopal episode at home pre admission  Stairs            Wheelchair Mobility    Modified  [place] (Stroke Patients Only)       Balance Overall balance assessment: Needs assistance Sitting-balance support: Feet supported Sitting balance-Leahy Scale: Good     Standing balance support: Single extremity supported;During functional activity Standing balance-Leahy Scale: Fair                               Pertinent Vitals/Pain Pain Assessment: 0-10 Pain Score: 8  Pain Location: headache,LKneepain Pain Descriptors / Indicators: Grimacing;Guarding Pain Intervention(s): Limited activity within patient's tolerance;Monitored during session;Repositioned(awaiting pain meds per nsg, contacted MD)    Home Living Family/patient expects to be discharged to:: Private residence Living Arrangements: Children Available Help at Discharge: Family;Available 24 hours/day Type of Home: House Home Access: Level entry     Home Layout: Two level;Able to live on main level with bedroom/bathroom Home Equipment: Gilford Rile - 2 wheels Additional Comments: pt lives with her daughter and 3 grandaughter (29, 1 in college), grandson (21)    Prior Function Level of Independence: Needs assistance   Gait / Transfers Assistance Needed: no AD   ADL's / Homemaking Assistance Needed: daughter assists with IADL (grocery shopping, driving, medication management);otherwise pt is independent         Hand Dominance   Dominant Hand: Right    Extremity/Trunk Assessment   Upper Extremity Assessment Upper Extremity Assessment: Defer to OT evaluation LUE Coordination: decreased gross motor    Lower Extremity Assessment Lower Extremity Assessment: LLE deficits/detail LLE Deficits / Details: effusion  Lknee with pain LLE Coordination: decreased gross motor    Cervical / Trunk Assessment Cervical / Trunk Assessment: Normal  Communication   Communication: No difficulties  Cognition Arousal/Alertness: Awake/alert Behavior During Therapy: WFL for tasks assessed/performed Overall Cognitive  Status: No family/caregiver present to determine baseline cognitive functioning                                 General Comments: pt doesnt see the point of using AD      General Comments General comments (skin integrity, edema, etc.): cleared for CVA with MRI, and is now demonstrating limitations on LLE more related to fall    Exercises     Assessment/Plan    PT Assessment Patient needs continued PT services  PT Problem List Decreased strength;Decreased range of motion;Decreased activity tolerance;Decreased balance;Decreased mobility;Decreased coordination;Decreased safety awareness;Decreased knowledge of use of DME;Pain;Decreased skin integrity       PT Treatment Interventions DME instruction;Gait training;Functional mobility training;Therapeutic activities;Therapeutic exercise;Balance training;Neuromuscular re-education;Patient/family education    PT Goals (Current goals can be found in the Care Plan section)  Acute Rehab PT Goals Patient Stated Goal: to go home  PT Goal Formulation: With patient Time For Goal Achievement: 09/10/19 Potential to Achieve Goals: Good    Frequency Min 3X/week   Barriers to discharge Decreased caregiver support decreased help at home     Co-evaluation               AM-PAC PT "6 Clicks" Mobility  Outcome Measure Help needed turning from your back to your side while in a flat bed without using bedrails?: None Help needed moving from lying on your back to sitting on the side of a flat bed without using bedrails?: None Help needed moving to and from a bed to a chair (including a wheelchair)?: A Little Help needed standing up from a chair using your arms (e.g., wheelchair or bedside chair)?: A Little Help needed to walk in hospital room?: A Little Help needed climbing 3-5 steps with a railing? : A Lot 6 Click Score: 19    End of Session Equipment Utilized During Treatment: Gait belt Activity Tolerance: Patient limited by  fatigue;Treatment limited secondary to medical complications (Comment);Patient limited by pain(HA and L knee 7-8 pain) Patient left: in bed;with call bell/phone within reach;with bed alarm set Nurse Communication: Mobility status;Other (comment)(discharge plans) PT Visit Diagnosis: Unsteadiness on feet (R26.81);Muscle weakness (generalized) (M62.81);Pain Pain - Right/Left: Left Pain - part of body: Knee    Time: 1342-1411 PT Time Calculation (min) (ACUTE ONLY): 29 min   Charges:   PT Evaluation $PT Eval Moderate Complexity: 1 Mod PT Treatments $Gait Training: 8-22 mins       Ramond Dial 09/03/2019, 3:37 PM   Mee Hives, PT MS Acute Rehab Dept. Number: [place] and [place]

## 2019-09-03 NOTE — Progress Notes (Signed)
Carotid artery duplex completed. Refer to "CV Proc" under chart review to view preliminary results.  09/03/2019 9:47 AM Kelby Aline., MHA, RVT, RDCS, RDMS

## 2019-09-03 NOTE — Progress Notes (Signed)
PT Cancellation Note  Patient Details Name: Amy Nunez MRN: [id number] DOB: 12-22-1960   Cancelled Treatment:    Reason Eval/Treat Not Completed: Fatigue/lethargy limiting ability to participate.  At MRI and will re-attempt later today as time and pt allow.   Ramond Dial 09/03/2019, 11:41 AM    Mee Hives, PT MS Acute Rehab Dept. Number: [place] and [place]

## 2019-09-03 NOTE — H&P (Signed)
History and Physical   Amy Nunez WPY:[id number] DOB: 09-23-1960 DOA: 09/01/2019  Referring MD/NP/PA: [facility]  PCP: Einar Pheasant, DO    Patient coming from: Home via [facility]  Chief Complaint: Left sided Weakness  HPI: Amy Nunez is a 59 y.o. female with medical history significant of Diabetes, HTN, IBS, who went to [facility] with abdominal pain and left sided weakness.  Patient was evaluated.  She has risk factors for CVA including hypertension and diabetes.  She was monitored and found to have significant left-sided weakness which has now resolved.  Her abdominal symptoms were thought to be due to her IBS. Neurology consulted and patient accepted for transfer to [facility] for evaluation.  Patient also complaining of headache.  No left-sided weakness which has resolved...  ED Course: Temp. 98.8, BP 95/59, P 75, RR 21 and Sats 96% on RA. Glucose 277, BUN 32, Creatinine 2.39, Lactic Acidosis: 2.8, CT abdomen cervical spine head all negative.  Chest x-ray showed no acute findings.  Patient admitted for TIA work-up.  Review of Systems: As per HPI otherwise 10 point review of systems negative.    Past Medical History:  Diagnosis Date  . Blood transfusion without reported diagnosis   . Cancer [facility])    renal cancer  . Diabetes mellitus without complication ([place])   . Hypertension   . IBS (irritable bowel syndrome)     Past Surgical History:  Procedure Laterality Date  . ABDOMINAL HYSTERECTOMY    . ANKLE SURGERY    . NEPHRECTOMY Right      reports that she has quit smoking. She has never used smokeless tobacco. She reports previous alcohol use. She reports that she does not use drugs.  Allergies  Allergen Reactions  . Penicillins Anaphylaxis and Rash    Other reaction(s): Unknown  . Ciprofloxacin Hives  . Red Dye Diarrhea    Other reaction(s): Unknown    History reviewed. No pertinent family history.   Prior to Admission medications   Medication Sig Start Date End Date Taking?  Authorizing Provider  dexamethasone (DECADRON) 4 MG tablet Take 1 tablet (4 mg total) by mouth 2 (two) times daily. 06/18/19   Virgel Manifold, MD  oxyCODONE-acetaminophen (PERCOCET/ROXICET) 5-325 MG tablet Take 1 tablet by mouth every 8 (eight) hours as needed for severe pain. 06/18/19   Virgel Manifold, MD  promethazine (PHENERGAN) 25 MG tablet Take 1 tablet (25 mg total) by mouth every 6 (six) hours as needed for nausea or vomiting. 05/17/19   Carlisle Cater, PA-C    Physical Exam: Vitals:   09/02/19 1930 09/02/19 2000 09/02/19 2009 09/02/19 2129  BP: 133/78 128/71 128/71 140/84  Pulse: 68 63 63 61  Resp: _0 Temp:    98.8 F (37.1 C)  TempSrc:    Oral  SpO2: 100% 100% 100% 100%  Weight:      Height:          Constitutional: NAD, calm, comfortable Vitals:   09/02/19 1930 09/02/19 2000 09/02/19 2009 09/02/19 2129  BP: 133/78 128/71 128/71 140/84  Pulse: 68 63 63 61  Resp: _1 Temp:    98.8 F (37.1 C)  TempSrc:    Oral  SpO2: 100% 100% 100% 100%  Weight:      Height:       Eyes: PERRL, lids and conjunctivae normal ENMT: Mucous membranes are moist. Posterior pharynx clear of any exudate or lesions.Normal dentition.  Neck: normal, supple, no masses, no thyromegaly  Respiratory: clear to auscultation bilaterally, no wheezing, no crackles. Normal respiratory effort. No accessory muscle use.  Cardiovascular: Regular rate and rhythm, no murmurs / rubs / gallops. No extremity edema. 2+ pedal pulses. No carotid bruits.  Abdomen: no tenderness, no masses palpated. No hepatosplenomegaly. Bowel sounds positive.  Musculoskeletal: no clubbing / cyanosis. No joint deformity upper and lower extremities. Good ROM, no contractures. Normal muscle tone.  Skin: no rashes, lesions, ulcers. No induration Neurologic: CN 2-12 grossly intact. Sensation intact, DTR normal. Strength 5/5 in all 4.  Psychiatric: Normal judgment and insight. Alert and oriented x 3. Normal mood.      Labs on Admission: I have personally reviewed following labs and imaging studies  CBC: Recent Labs  Lab 09/01/19 1524  WBC 10.9*  NEUTROABS 7.5  HGB 14.0  HCT 42.0  MCV 95.2  PLT 130   Basic Metabolic Panel: Recent Labs  Lab 09/01/19 1524  NA 135  K 5.0  CL 106  CO2 13*  GLUCOSE 277*  BUN 32*  CREATININE 2.39*  CALCIUM 9.3   GFR: Estimated Creatinine Clearance: 28.1 mL/min (A) (by C-G formula based on SCr of 2.39 mg/dL (H)). Liver Function Tests: Recent Labs  Lab 09/01/19 1524  AST 20  ALT 18  ALKPHOS 111  BILITOT 0.5  PROT 7.9  ALBUMIN 4.4   Recent Labs  Lab 09/01/19 1524  LIPASE 69*   No results for input(s): AMMONIA in the last 168 hours. Coagulation Profile: Recent Labs  Lab 09/01/19 2005  INR 1.0   Cardiac Enzymes: No results for input(s): CKTOTAL, CKMB, CKMBINDEX, TROPONINI in the last 168 hours. BNP (last 3 results) No results for input(s): PROBNP in the last 8760 hours. HbA1C: No results for input(s): HGBA1C in the last 72 hours. CBG: Recent Labs  Lab 09/02/19 0645 09/02/19 0839 09/02/19 1233 09/02/19 1728 09/02/19 2108  GLUCAP 121* 102* 171* 89 123*   Lipid Profile: No results for input(s): CHOL, HDL, LDLCALC, TRIG, CHOLHDL, LDLDIRECT in the last 72 hours. Thyroid Function Tests: No results for input(s): TSH, T4TOTAL, FREET4, T3FREE, THYROIDAB in the last 72 hours. Anemia Panel: No results for input(s): VITAMINB12, FOLATE, FERRITIN, TIBC, IRON, RETICCTPCT in the last 72 hours. Urine analysis:    Component Value Date/Time   COLORURINE YELLOW 09/01/2019 2005   APPEARANCEUR HAZY (A) 09/01/2019 2005   LABSPEC 1.025 09/01/2019 2005   PHURINE 5.5 09/01/2019 2005   GLUCOSEU NEGATIVE 09/01/2019 2005   HGBUR NEGATIVE 09/01/2019 2005   [place] NEGATIVE 09/01/2019 2005   [place] NEGATIVE 09/01/2019 2005   PROTEINUR 30 (A) 09/01/2019 2005   NITRITE NEGATIVE 09/01/2019 2005   LEUKOCYTESUR NEGATIVE 09/01/2019 2005    Sepsis Labs: _0 (procalcitonin:4,lacticidven:4) ) Recent Results (from the past 240 hour(s))  SARS Coronavirus 2 Ag (30 min TAT) - Nasal Swab (BD Veritor Kit)     Status: None   Collection Time: 09/01/19  3:24 PM   Specimen: Nasal Swab (BD Veritor Kit)  Result Value Ref Range Status   SARS Coronavirus 2 Ag NEGATIVE NEGATIVE Final    Comment: (NOTE) SARS-CoV-2 antigen NOT DETECTED.  Negative results are presumptive.  Negative results do not preclude SARS-CoV-2 infection and should not be used as the sole basis for treatment or other patient management decisions, including infection  control decisions, particularly in the presence of clinical signs and  symptoms consistent with COVID-19, or in those who have been in contact with the virus.  Negative results must be combined with clinical observations, patient history, and epidemiological  information. The expected result is Negative. Fact Sheet for Patients: PodPark.tn Fact Sheet for Healthcare Providers: GiftContent.is This test is not yet approved or cleared by the [place] FDA and  has been authorized for detection and/or diagnosis of SARS-CoV-2 by FDA under an Emergency Use Authorization (EUA).  This EUA will remain in effect (meaning this test can be used) for the duration of  the COVID-19 de claration under Section 564(b)(1) of the Act, 21 U.S.C. section 360bbb-3(b)(1), unless the authorization is terminated or revoked sooner. Performed at [facility], [address] [place], [place] [address]   Blood Culture (routine x 2)     Status: None (Preliminary result)   Collection Time: 09/01/19  5:45 PM   Specimen: BLOOD LEFT HAND  Result Value Ref Range Status   Specimen Description   Final    BLOOD LEFT HAND Performed at [facility], [address] [place], [place] [address]    Special Requests   Final    BOTTLES DRAWN AEROBIC  AND ANAEROBIC Blood Culture adequate volume Performed at [facility], [address] [place], [place] [address]    Culture   Final    NO GROWTH < 24 HOURS Performed at [facility], [place] [address], [place], [place] [address]    Report Status PENDING  Incomplete  SARS CORONAVIRUS 2 (TAT 6-24 HRS) Nasopharyngeal Nasopharyngeal Swab     Status: None   Collection Time: 09/01/19  6:04 PM   Specimen: Nasopharyngeal Swab  Result Value Ref Range Status   SARS Coronavirus 2 NEGATIVE NEGATIVE Final    Comment: (NOTE) SARS-CoV-2 target nucleic acids are NOT DETECTED. The SARS-CoV-2 RNA is generally detectable in upper and lower respiratory specimens during the acute phase of infection. Negative results do not preclude SARS-CoV-2 infection, do not rule out co-infections with other pathogens, and should not be used as the sole basis for treatment or other patient management decisions. Negative results must be combined with clinical observations, patient history, and epidemiological information. The expected result is Negative. Fact Sheet for Patients: SugarRoll.be Fact Sheet for Healthcare Providers: https://www.woods-mathews.com/ This test is not yet approved or cleared by the [place] FDA and  has been authorized for detection and/or diagnosis of SARS-CoV-2 by FDA under an Emergency Use Authorization (EUA). This EUA will remain  in effect (meaning this test can be used) for the duration of the COVID-19 declaration under Section 56 4(b)(1) of the Act, 21 U.S.C. section 360bbb-3(b)(1), unless the authorization is terminated or revoked sooner. Performed at [facility], [place] [address], [place], [place] [address]   Blood Culture (routine x 2)     Status: None (Preliminary result)   Collection Time: 09/01/19  8:05 PM   Specimen: BLOOD  Result Value Ref Range Status   Specimen Description   Final    BLOOD BLOOD RIGHT  FOREARM Performed at [facility], [address] [place], [place] [address]    Special Requests   Final    BOTTLES DRAWN AEROBIC AND ANAEROBIC Blood Culture adequate volume Performed at [facility], [address] [place], [place] [address]    Culture   Final    NO GROWTH < 12 HOURS Performed at [facility], [place] [address], [place], [place] [address]    Report Status PENDING  Incomplete  Urine culture     Status: None   Collection Time: 09/01/19  8:30 PM   Specimen: In/Out Cath Urine  Result Value Ref Range Status   Specimen Description   Final    IN/OUT CATH URINE Performed at [facility], [address] [place], [place] [address]    Special Requests   Final    NONE Performed at [facility], [address] [place], [place] [address]    Culture   Final    NO GROWTH Performed at [facility], [place] [address], [place], [place] [address]    Report Status 09/02/2019 FINAL  Final     Radiological Exams on Admission: CT ABDOMEN PELVIS WO CONTRAST  Result Date: 09/01/2019 CLINICAL DATA:  Abdominal pain with nausea, vomiting, and diarrhea. Fever. EXAM: CT ABDOMEN AND PELVIS WITHOUT CONTRAST TECHNIQUE: Multidetector CT imaging of the abdomen and pelvis was performed following the standard protocol without IV contrast. COMPARISON:  None. FINDINGS: Lower chest: Minimal linear scarring or atelectasis at the lung bases. Heart size is normal. Aortic atherosclerosis. Hepatobiliary: No focal liver abnormality is seen. No gallstones, gallbladder wall thickening, or biliary dilatation. Pancreas: Unremarkable. No pancreatic ductal dilatation or surrounding inflammatory changes. Spleen: Normal in size without focal abnormality. Adrenals/Urinary Tract: Right nephrectomy. Adrenal glands are normal. 3.7 cm cyst on the lower pole of the hypertrophied left kidney. No hydronephrosis. The bladder is empty. Stomach/Bowel: Stomach is within normal  limits. Appendix appears normal. No evidence of bowel wall thickening, distention, or inflammatory changes. There are few diverticula in the ascending colon. The appendix is prominent but there are no inflammatory changes and there is no appendicolith. Vascular/Lymphatic: Aortic atherosclerosis. No enlarged abdominal or pelvic lymph nodes. Reproductive: Status post hysterectomy. No adnexal masses. Other: No abdominal wall hernia or abnormality. No abdominopelvic ascites. Musculoskeletal: No acute or significant osseous findings. IMPRESSION: Benign-appearing abdomen and pelvis. Electronically Signed   By: Lorriane Shire M.D.   On: 09/01/2019 17:50   CT Head Wo Contrast  Result Date: 09/01/2019 CLINICAL DATA:  Headache, fever, and left-sided weakness. EXAM: CT HEAD WITHOUT CONTRAST CT CERVICAL SPINE WITHOUT CONTRAST TECHNIQUE: Multidetector CT imaging of the head and cervical spine was performed following the standard protocol without intravenous contrast. Multiplanar CT image reconstructions of the cervical spine were also generated. COMPARISON:  None. FINDINGS: CT HEAD FINDINGS Brain: No evidence of acute infarction, hemorrhage, hydrocephalus, extra-axial collection or mass lesion/mass effect. Vascular: No hyperdense vessel or unexpected calcification. Skull: Normal. Negative for fracture or focal lesion. Sinuses/Orbits: Normal. Other: None CT CERVICAL SPINE FINDINGS Alignment: Straightening of the cervical lordosis. 2 mm anterolisthesis of C3 on C4. Skull base and vertebrae: No acute fracture. No primary bone lesion or focal pathologic process. Soft tissues and spinal canal: No prevertebral fluid or swelling. No visible canal hematoma. Disc levels:  C2-3: Negative. C3-4: Disc space narrowing. Minimal anterolisthesis. Small uncinate spurs extend into the left and right neural foraminal with slight left foraminal narrowing. No soft disc protrusions. C4-5: Disc space narrowing. Small broad-based disc osteophyte  complex narrowing both lateral recesses. Moderate bilateral foraminal narrowing. C5-6: Disc space narrowing. Broad-based disc osteophyte complex with a central disc protrusion which narrows the AP diameter of the spinal canal. Moderate bilateral foraminal narrowing. C6-7: Disc space narrowing. Slight bilateral foraminal narrowing, right greater than left. C7-T1: Normal. Upper chest: Negative. Other: None IMPRESSION: 1. Normal CT scan of the head. 2. No acute abnormality of the cervical spine. Multilevel degenerative disc and joint disease. Electronically Signed   By: Lorriane Shire M.D.  On: 09/01/2019 17:43   CT Cervical Spine Wo Contrast  Result Date: 09/01/2019 CLINICAL DATA:  Headache, fever, and left-sided weakness. EXAM: CT HEAD WITHOUT CONTRAST CT CERVICAL SPINE WITHOUT CONTRAST TECHNIQUE: Multidetector CT imaging of the head and cervical spine was performed following the standard protocol without intravenous contrast. Multiplanar CT image reconstructions of the cervical spine were also generated. COMPARISON:  None. FINDINGS: CT HEAD FINDINGS Brain: No evidence of acute infarction, hemorrhage, hydrocephalus, extra-axial collection or mass lesion/mass effect. Vascular: No hyperdense vessel or unexpected calcification. Skull: Normal. Negative for fracture or focal lesion. Sinuses/Orbits: Normal. Other: None CT CERVICAL SPINE FINDINGS Alignment: Straightening of the cervical lordosis. 2 mm anterolisthesis of C3 on C4. Skull base and vertebrae: No acute fracture. No primary bone lesion or focal pathologic process. Soft tissues and spinal canal: No prevertebral fluid or swelling. No visible canal hematoma. Disc levels:  C2-3: Negative. C3-4: Disc space narrowing. Minimal anterolisthesis. Small uncinate spurs extend into the left and right neural foraminal with slight left foraminal narrowing. No soft disc protrusions. C4-5: Disc space narrowing. Small broad-based disc osteophyte complex narrowing both lateral  recesses. Moderate bilateral foraminal narrowing. C5-6: Disc space narrowing. Broad-based disc osteophyte complex with a central disc protrusion which narrows the AP diameter of the spinal canal. Moderate bilateral foraminal narrowing. C6-7: Disc space narrowing. Slight bilateral foraminal narrowing, right greater than left. C7-T1: Normal. Upper chest: Negative. Other: None IMPRESSION: 1. Normal CT scan of the head. 2. No acute abnormality of the cervical spine. Multilevel degenerative disc and joint disease. Electronically Signed   By: Lorriane Shire M.D.   On: 09/01/2019 17:43   DG Chest Portable 1 View  Result Date: 09/01/2019 CLINICAL DATA:  Chest pressure. Nausea, vomiting, diarrhea. Fever and weakness. EXAM: PORTABLE CHEST 1 VIEW COMPARISON:  None. FINDINGS: The heart size and mediastinal contours are within normal limits. Both lungs are clear. The visualized skeletal structures are unremarkable. IMPRESSION: Normal exam. Electronically Signed   By: Lorriane Shire M.D.   On: 09/01/2019 17:50    EKG: Independently reviewed. It shows sinus rhythm with a rate of 72, nonspecific ST changes  Assessment/Plan Principal Problem:   Left-sided weakness Active Problems:   Abdominal pain   Diabetes (HCC)   Benign essential HTN   Irritable bowel syndrome (IBS)     #1 TIA:-Patient has left-sided weakness.  Patient also complaining of persistent headaches.  Could be migraine which could be a complex migraine leading to the left-sided weakness.  We will continue with work-up including MRI of the brain carotid Dopplers and echocardiogram.  Weakness has resolved.  Neurology consulted and will follow  #2 diabetes: Continue SSI.  #3 hypertension: Blood pressures well controlled.  Continue treatment  #4 IBS: May because of abdominal pain.  Continue treatment    DVT prophylaxis: Lovenox Code Status: Full code Family Communication: No family at bedside Disposition Plan: Home Consults called: Dr.  Cheral Marker, neurology Admission status: Observation  Severity of Illness: The appropriate patient status for this patient is INPATIENT. Inpatient status is judged to be reasonable and necessary in order to provide the required intensity of service to ensure the patient's safety. The patient's presenting symptoms, physical exam findings, and initial radiographic and laboratory data in the context of their chronic comorbidities is felt to place them at high risk for further clinical deterioration. Furthermore, it is not anticipated that the patient will be medically stable for discharge from the hospital within 2 midnights of admission. The following factors support the patient status  of inpatient.   " The patient's presenting symptoms include left-sided weakness and abdominal pain. " The worrisome physical exam findings include no focal weakness. " The initial radiographic and laboratory data are worrisome because of normal head CT. " The chronic co-morbidities include diabetes and hypertension.   * I certify that at the point of admission it is my clinical judgment that the patient will require inpatient hospital care spanning beyond 2 midnights from the point of admission due to high intensity of service, high risk for further deterioration and high frequency of surveillance required.Barbette Merino MD [facility] Pager [phone]  If 7PM-7AM, please contact night-coverage [email or web address] Password [email or web address]  09/03/2019, 12:07 AM

## 2019-09-03 NOTE — Progress Notes (Signed)
  Echocardiogram 2D Echocardiogram has been performed.  Darlina Sicilian M 09/03/2019, 1:48 PM

## 2019-09-03 NOTE — Progress Notes (Signed)
Occupational Therapy Evaluation Patient Details Name: Amy Nunez MRN: [id number] DOB: 1960/11/08 Today's Date: 09/03/2019    History of Present Illness Amy Nunez is a 59 y.o. female with medical history significant of Diabetes, HTN, IBS. Admitted 1/4 with abdominal pain and left sided weakness. Awaiting MRI results.   Clinical Impression   PTA, pt was living at home with her daughter and grandchildren, pt reports she was independent with ADL and her daughter assisted with IADL (driving, grocery shopping, medication management) and pt reports she was independent with mobility. Upon arrival pt ambulating in room searching for her charger, pt demonstrated decreased safety awareness of IV pole and attempting to climb over bed rail with return to bed. Pt demonstrated ability to complete simple ADL with modified independence, requiring increased time. She required supervision with functional mobility without AD due to decreased safety awareness. Pt reports her daughter is with her 24/7 and grandchildren are present too. Pt reports she feels she is at her baseline despite sensation deficits and LUE slightly weaker than RUE. Anticipate pt will continue to progress toward prior level of functioning. Patient evaluated by Occupational Therapy with no further acute OT needs identified. All education has been completed and the patient has no further questions. See below for any follow-up Occupational Therapy or equipment needs. OT to sign off. Thank you for referral.       Follow Up Recommendations  No OT follow up;Supervision - Intermittent    Equipment Recommendations  3 in 1 bedside commode    Recommendations for Other Services       Precautions / Restrictions Precautions Precautions: Fall Restrictions Weight Bearing Restrictions: No      Mobility Bed Mobility Overal bed mobility: Independent                Transfers Overall transfer level: Needs assistance Equipment used:  None Transfers: Sit to/from Stand Sit to Stand: Supervision         General transfer comment: supervision for safety;pt with uncontrolled descent into bed    Balance Overall balance assessment: Mild deficits observed, not formally tested                                         ADL either performed or assessed with clinical judgement   ADL Overall ADL's : Modified independent                                       General ADL Comments: pt able to complete ADL at modified independent level with increased time;pt required supervision for functional mobility due to decreased safety awareness, appeared to have decreased awareness of IV pole;pt reports she is at her baseline      Vision Baseline Vision/History: No visual deficits Patient Visual Report: No change from baseline Vision Assessment?: No apparent visual deficits     Perception     Praxis      Pertinent Vitals/Pain Pain Assessment: 0-10 Pain Score: 7  Pain Location: stomach Pain Descriptors / Indicators: Sore;Discomfort Pain Intervention(s): Limited activity within patient's tolerance;Monitored during session     Hand Dominance Right   Extremity/Trunk Assessment Upper Extremity Assessment Upper Extremity Assessment: Overall WFL for tasks assessed;LUE deficits/detail LUE Deficits / Details: grossly 4/5;reports tingling starts at the shoulder and stops at the wrist;able to distinguish  temperature LUE Sensation: decreased light touch LUE Coordination: decreased gross motor;decreased fine motor   Lower Extremity Assessment Lower Extremity Assessment: Defer to PT evaluation   Cervical / Trunk Assessment Cervical / Trunk Assessment: Normal   Communication Communication Communication: No difficulties   Cognition Arousal/Alertness: Awake/alert Behavior During Therapy: WFL for tasks assessed/performed Overall Cognitive Status: No family/caregiver present to determine baseline  cognitive functioning                                 General Comments: pt with decreased safety awareness, upon arrival pt walking around room appeared to have decreased awareness of IV pole, upon returning to bed pt attempted to climb over bed rails, required cue to walk around bed to open side with guard rail down   General Comments  discussed desensitization strategies    Exercises     Shoulder Instructions      Home Living Family/patient expects to be discharged to:: Private residence Living Arrangements: Children Available Help at Discharge: Family;Available 24 hours/day(daughter works from home) Type of Home: House Home Access: Level entry     [place]: Two level;Able to live on main level with bedroom/bathroom     Bathroom Shower/Tub: Teacher, early years/pre: Standard Bathroom Accessibility: Yes How Accessible: Accessible via walker Home Equipment: [place] - 2 wheels   Additional Comments: pt lives with her daughter and 3 grandaughter (17, 1 in college), grandson (72)      Prior Functioning/Environment Level of Independence: Needs assistance  Gait / Transfers Assistance Needed: no AD  ADL's / Homemaking Assistance Needed: daughter assists with IADL (grocery shopping, driving, medication management);otherwise pt is independent             OT Problem List: Impaired balance (sitting and/or standing);Decreased safety awareness;Decreased knowledge of precautions;Impaired sensation      OT Treatment/Interventions:      OT Goals(Current goals can be found in the care plan section) Acute Rehab OT Goals Patient Stated Goal: to go home  OT Goal Formulation: With patient Time For Goal Achievement: 09/17/19 Potential to Achieve Goals: Good  OT Frequency:     Barriers to D/C:            Co-evaluation              AM-PAC OT "6 Clicks" Daily Activity     Outcome Measure Help from another person eating meals?: None Help from  another person taking care of personal grooming?: None Help from another person toileting, which includes using toliet, bedpan, or urinal?: A Little Help from another person bathing (including washing, rinsing, drying)?: None Help from another person to put on and taking off regular upper body clothing?: None Help from another person to put on and taking off regular lower body clothing?: None 6 Click Score: 23   End of Session Nurse Communication: Mobility status  Activity Tolerance: Patient tolerated treatment well Patient left: in bed;with call bell/phone within reach;with nursing/sitter in room  OT Visit Diagnosis: Unsteadiness on feet (R26.81);Other abnormalities of gait and mobility (R26.89);History of falling (Z91.81);Muscle weakness (generalized) (M62.81)                Time: [id number] OT Time Calculation (min): 14 min Charges:  OT General Charges $OT Visit: 1 Visit OT Evaluation $OT Eval Low Complexity: Humphrey OTR/L Acute Rehabilitation Services Office: [place] 09/03/2019, 10:45 AM

## 2019-09-03 NOTE — Progress Notes (Addendum)
Inpatient Diabetes Program Recommendations  AACE/ADA: New Consensus Statement on Inpatient Glycemic Control (2015)  Target Ranges:  Prepandial:   less than 140 mg/dL      Peak postprandial:   less than 180 mg/dL (1-2 hours)      Critically ill patients:  140 - 180 mg/dL   Lab Results  Component Value Date   GLUCAP 165 (H) 09/03/2019   HGBA1C 8.4 (H) 09/02/2019    Review of Glycemic Control Results for Amy Nunez, Amy Nunez (MRN [id number]) as of 09/03/2019 13:41  Ref. Range 09/02/2019 17:28 09/02/2019 21:08 09/03/2019 08:27 09/03/2019 09:10 09/03/2019 13:09  Glucose-Capillary Latest Ref Range: 70 - 99 mg/dL 89 123 (H) 45 (L) 88 165 (H)   Diabetes history: DM Outpatient Diabetes medications: Lantus 50 units daily + Apidra 10 units tid meal coverage Current orders for Inpatient glycemic control: Lantus 60 units + Novolog sensitive correction tid  Inpatient Diabetes Program Recommendations:   Fasting CBG 45 this am -Decrease Lantus to 40 units daily -Add Novolog 2 units tid meal coverage if eats 50% meals  Thank you, Nani Gasser. Delwyn Scoggin, RN, MSN, CDE  Diabetes Coordinator Inpatient Glycemic Control Team Team Pager [phone] (8am-5pm) 09/03/2019 1:44 PM

## 2019-09-04 DIAGNOSIS — I10 Essential (primary) hypertension: Secondary | ICD-10-CM

## 2019-09-04 DIAGNOSIS — K580 Irritable bowel syndrome with diarrhea: Secondary | ICD-10-CM

## 2019-09-04 LAB — GLUCOSE, CAPILLARY
Glucose-Capillary: 48 mg/dL — ABNORMAL LOW (ref 70–99)
Glucose-Capillary: 84 mg/dL (ref 70–99)
Glucose-Capillary: 133 mg/dL — ABNORMAL HIGH (ref 70–99)

## 2019-09-04 MED ORDER — GABAPENTIN 800 MG PO TABS: 800.0000 mg | ORAL_TABLET | Freq: Two times a day (BID) | ORAL | Status: DC

## 2019-09-04 MED ORDER — ONDANSETRON HCL 4 MG PO TABS: 4.0000 mg | ORAL_TABLET | Freq: Three times a day (TID) | ORAL | Status: DC | PRN

## 2019-09-04 MED ORDER — PROCHLORPERAZINE EDISYLATE 10 MG/2ML IJ SOLN
10.0000 mg | Freq: Once | INTRAMUSCULAR | Status: AC
  Administered 2019-09-04: 10 mg via INTRAVENOUS
  Filled 2019-09-04 (×2): qty 2

## 2019-09-04 MED ORDER — KETOROLAC TROMETHAMINE 15 MG/ML IJ SOLN
15.0000 mg | Freq: Once | INTRAMUSCULAR | Status: AC
  Administered 2019-09-04: 15 mg via INTRAVENOUS
  Filled 2019-09-04: qty 1

## 2019-09-04 MED ORDER — METOCLOPRAMIDE HCL 5 MG/ML IJ SOLN
10.0000 mg | Freq: Once | INTRAMUSCULAR | Status: AC
  Administered 2019-09-04: 10 mg via INTRAVENOUS
  Filled 2019-09-04: qty 2

## 2019-09-04 MED ORDER — INSULIN GLARGINE 100 UNIT/ML ~~LOC~~ SOLN
40.0000 [IU] | Freq: Every day | SUBCUTANEOUS | Status: DC
  Filled 2019-09-04: qty 0.4

## 2019-09-04 NOTE — Evaluation (Signed)
Speech Language Pathology Evaluation Patient Details Name: Amy Nunez MRN: [id number] DOB: [birth year] Today's Date: 09/04/2019 Time:  -     Problem List:  Patient Active Problem List   Diagnosis Date Noted  . Abdominal pain 09/03/2019  . Diabetes ([place]) 09/03/2019  . Benign essential HTN 09/03/2019  . Irritable bowel syndrome (IBS) 09/03/2019  . Left-sided weakness 09/01/2019   Past Medical History:  Past Medical History:  Diagnosis Date  . Blood transfusion without reported diagnosis   . Cancer [facility])    renal cancer  . Diabetes mellitus without complication ([place])   . Hypertension   . IBS (irritable bowel syndrome)    Past Surgical History:  Past Surgical History:  Procedure Laterality Date  . ABDOMINAL HYSTERECTOMY    . ANKLE SURGERY    . NEPHRECTOMY Right    HPI:  Ms Amy Nunez, 58y/f, presented to ED with significant left-sided weakness that has since resolved. Negative for stroke. PMH of diabetes, HTN and IBS.   Assessment / Plan / Recommendation Clinical Impression  Cognitive and communication evaluation completed. Pt reported mild slurring of speech at onset of symtoms. At this time all her symptoms have resolved. She is communicated at the conversational level with memory, attention higher level cognition found to be WNL. Skilled Speech therapy services are not recommended at this time.     SLP Assessment  SLP Recommendation/Assessment: Patient does not need any further Speech Lanaguage Pathology Services SLP Visit Diagnosis: Cognitive communication deficit (R41.841)    Follow Up Recommendations  None               SLP Evaluation Cognition  Overall Cognitive Status: Within Functional Limits for tasks assessed Arousal/Alertness: Awake/alert Orientation Level: Oriented X4 Attention: Focused;Sustained;Selective Focused Attention: Appears intact Sustained Attention: Appears intact Memory: Appears intact Immediate Memory Recall:  Sock;Blue;Bed Memory Recall Sock: Without Cue Memory Recall Blue: Without Cue Memory Recall Bed: Without Cue Awareness: Appears intact Problem Solving: Appears intact Executive Function: Reasoning Reasoning: Appears intact Safety/Judgment: Appears intact       Comprehension  Auditory Comprehension Overall Auditory Comprehension: Appears within functional limits for tasks assessed Yes/No Questions: Within Functional Limits Commands: Within Functional Limits Conversation: Complex Visual Recognition/Discrimination Discrimination: Within Function Limits Reading Comprehension Reading Status: Within funtional limits    Expression Expression Primary Mode of Expression: Verbal Verbal Expression Overall Verbal Expression: Appears within functional limits for tasks assessed Initiation: No impairment Level of Generative/Spontaneous Verbalization: Conversation Repetition: No impairment Naming: No impairment Pragmatics: No impairment Written Expression Dominant Hand: Right Written Expression: Within Functional Limits   Oral / Motor  Oral Motor/Sensory Function Overall Oral Motor/Sensory Function: Within functional limits Motor Speech Overall Motor Speech: Appears within functional limits for tasks assessed Respiration: Within functional limits Phonation: Normal Resonance: Within functional limits Articulation: Within functional limitis Intelligibility: Intelligible Motor Planning: Witnin functional limits Motor Speech Errors: Not applicable   GO                    Wynelle Bourgeois., MA, CCC-SLP 09/04/2019, 10:53 AM

## 2019-09-04 NOTE — Discharge Summary (Signed)
Physician Discharge Summary  Roselyne MARIETA MARKOV SFS:[id number] DOB: Jan 11, 1961 DOA: 09/01/2019  PCP: Einar Pheasant, DO  Admit date: 09/01/2019 Discharge date: 09/04/2019  Admitted From: Home Disposition: Home  Recommendations for Outpatient Follow-up:  1. Follow up with PCP in 1 week 2. Please obtain BMP/CBC in one week 3. Please follow up on the following pending results: None  Home Health: PT Equipment/Devices: Rolling walker, 3 in 1 commode  Discharge Condition: Stable CODE STATUS: Full code Diet recommendation: Heart healthy   Brief/Interim Summary:  Admission HPI written by Gala Romney, MD   Chief Complaint: Left sided Weakness  HPI: Terese LAWONDA PRETLOW is a 59 y.o. female with medical history significant of Diabetes, HTN, IBS, who went to [facility] with abdominal pain and left sided weakness.  Patient was evaluated.  She has risk factors for CVA including hypertension and diabetes.  She was monitored and found to have significant left-sided weakness which has now resolved.  Her abdominal symptoms were thought to be due to her IBS. Neurology consulted and patient accepted for transfer to [facility] for evaluation.  Patient also complaining of headache.  No left-sided weakness which has resolved...  ED Course: Temp. 98.8, BP 95/59, P 75, RR 21 and Sats 96% on RA. Glucose 277, BUN 32, Creatinine 2.39, Lactic Acidosis: 2.8, CT abdomen cervical spine head all negative.  Chest x-ray showed no acute findings.  Patient admitted for TIA work-up.   Hospital course:  Left sided weakness Initial concern for TIA. Neurology consulted and recommended MRI which was not significant for stroke. Per neurology, likely recrudescence of previous symptoms from prior stroke vs secondary to migraine. PT recommending home health PT.  Headache Possibly migrainous. Given Tramadol, in addition to Compazine, Phenergan and Toradol with improvement.  AKI Patient given IV fluids with resolution. Resume home  ACEi. Will need to hold if becoming dehydrated.  Diabetes mellitus, insulin dependent Episode of hypoglycemia while inpatient in setting of change of diet. Continue home regimen.  Essential hypertension Continue home medications  IBS No leukocytosis. No fevers.  Discharge Diagnoses:  Principal Problem:   Left-sided weakness Active Problems:   Abdominal pain   Diabetes (HCC)   Benign essential HTN   Irritable bowel syndrome (IBS)    Discharge Instructions   Allergies as of 09/04/2019      Reactions   Penicillins Anaphylaxis, Rash   Other reaction(s): Unknown   Ciprofloxacin Hives   Red Dye Diarrhea   Other reaction(s): Unknown      Medication List    TAKE these medications   amLODipine 10 MG tablet Commonly known as: NORVASC Take 10 mg by mouth daily.   Apidra 100 UNIT/ML injection Generic drug: insulin glulisine Inject 10 Units into the skin 3 (three) times daily before meals.   aspirin-acetaminophen-caffeine 250-250-65 MG tablet Commonly known as: EXCEDRIN MIGRAINE Take 1 tablet by mouth every 6 (six) hours as needed for headache.   atorvastatin 80 MG tablet Commonly known as: LIPITOR Take 80 mg by mouth daily.   dexamethasone 4 MG tablet Commonly known as: DECADRON Take 1 tablet (4 mg total) by mouth 2 (two) times daily.   gabapentin 800 MG tablet Commonly known as: NEURONTIN Take 1 tablet (800 mg total) by mouth 2 (two) times daily. What changed: when to take this   HYDROcodone-acetaminophen 10-325 MG tablet Commonly known as: NORCO Take 1 tablet by mouth every 6 (six) hours as needed (pain).   hydrOXYzine 25 MG tablet Commonly known as: ATARAX/VISTARIL Take 25  mg by mouth every 6 (six) hours as needed for itching.   Lantus SoloStar 100 UNIT/ML Solostar Pen Generic drug: Insulin Glargine Inject 60 Units into the skin at bedtime.   lisinopril 40 MG tablet Commonly known as: ZESTRIL Take 40 mg by mouth daily.   morphine 30 MG 12 hr  tablet Commonly known as: MS CONTIN Take 30 mg by mouth every 12 (twelve) hours.   QUEtiapine 200 MG tablet Commonly known as: SEROQUEL Take 1-2 tablets by mouth See admin instructions. Take 1 tablet in the morning and 2 tablets at night.   tiZANidine 4 MG tablet Commonly known as: ZANAFLEX Take 4 mg by mouth every 8 (eight) hours.   traMADol 50 MG tablet Commonly known as: ULTRAM Take 50 mg by mouth 3 (three) times daily as needed for pain.   traZODone 50 MG tablet Commonly known as: DESYREL Take 50 mg by mouth at bedtime as needed for sleep.            Durable Medical Equipment  (From admission, onward)         Start     Ordered   09/04/19 1345  For home use only DME Walker rolling  Once    Question Answer Comment  Walker: With [place] Wheels   Patient needs a walker to treat with the following condition Left-sided weakness      09/04/19 1346   09/03/19 1208  For home use only DME 3 n 1  Once     09/03/19 1207         Follow-up Information    Einar Pheasant, DO. Schedule an appointment as soon as possible for a visit in 1 week(s).   Specialty: Family Medicine Why: Hospital follow-up Contact information: [address] [place] [address] [place] [address] [phone]          Allergies  Allergen Reactions  . Penicillins Anaphylaxis and Rash    Other reaction(s): Unknown  . Ciprofloxacin Hives  . Red Dye Diarrhea    Other reaction(s): Unknown    Consultations:  Neurology   Procedures/Studies: CT ABDOMEN PELVIS WO CONTRAST  Result Date: 09/01/2019 CLINICAL DATA:  Abdominal pain with nausea, vomiting, and diarrhea. Fever. EXAM: CT ABDOMEN AND PELVIS WITHOUT CONTRAST TECHNIQUE: Multidetector CT imaging of the abdomen and pelvis was performed following the standard protocol without IV contrast. COMPARISON:  None. FINDINGS: Lower chest: Minimal linear scarring or atelectasis at the lung bases. Heart size is normal. Aortic atherosclerosis.  Hepatobiliary: No focal liver abnormality is seen. No gallstones, gallbladder wall thickening, or biliary dilatation. Pancreas: Unremarkable. No pancreatic ductal dilatation or surrounding inflammatory changes. Spleen: Normal in size without focal abnormality. Adrenals/Urinary Tract: Right nephrectomy. Adrenal glands are normal. 3.7 cm cyst on the lower pole of the hypertrophied left kidney. No hydronephrosis. The bladder is empty. Stomach/Bowel: Stomach is within normal limits. Appendix appears normal. No evidence of bowel wall thickening, distention, or inflammatory changes. There are few diverticula in the ascending colon. The appendix is prominent but there are no inflammatory changes and there is no appendicolith. Vascular/Lymphatic: Aortic atherosclerosis. No enlarged abdominal or pelvic lymph nodes. Reproductive: Status post hysterectomy. No adnexal masses. Other: No abdominal wall hernia or abnormality. No abdominopelvic ascites. Musculoskeletal: No acute or significant osseous findings. IMPRESSION: Benign-appearing abdomen and pelvis. Electronically Signed   By: Lorriane Shire M.D.   On: 09/01/2019 17:50   CT Head Wo Contrast  Result Date: 09/01/2019 CLINICAL DATA:  Headache, fever, and left-sided weakness. EXAM:  CT HEAD WITHOUT CONTRAST CT CERVICAL SPINE WITHOUT CONTRAST TECHNIQUE: Multidetector CT imaging of the head and cervical spine was performed following the standard protocol without intravenous contrast. Multiplanar CT image reconstructions of the cervical spine were also generated. COMPARISON:  None. FINDINGS: CT HEAD FINDINGS Brain: No evidence of acute infarction, hemorrhage, hydrocephalus, extra-axial collection or mass lesion/mass effect. Vascular: No hyperdense vessel or unexpected calcification. Skull: Normal. Negative for fracture or focal lesion. Sinuses/Orbits: Normal. Other: None CT CERVICAL SPINE FINDINGS Alignment: Straightening of the cervical lordosis. 2 mm anterolisthesis of C3 on  C4. Skull base and vertebrae: No acute fracture. No primary bone lesion or focal pathologic process. Soft tissues and spinal canal: No prevertebral fluid or swelling. No visible canal hematoma. Disc levels:  C2-3: Negative. C3-4: Disc space narrowing. Minimal anterolisthesis. Small uncinate spurs extend into the left and right neural foraminal with slight left foraminal narrowing. No soft disc protrusions. C4-5: Disc space narrowing. Small broad-based disc osteophyte complex narrowing both lateral recesses. Moderate bilateral foraminal narrowing. C5-6: Disc space narrowing. Broad-based disc osteophyte complex with a central disc protrusion which narrows the AP diameter of the spinal canal. Moderate bilateral foraminal narrowing. C6-7: Disc space narrowing. Slight bilateral foraminal narrowing, right greater than left. C7-T1: Normal. Upper chest: Negative. Other: None IMPRESSION: 1. Normal CT scan of the head. 2. No acute abnormality of the cervical spine. Multilevel degenerative disc and joint disease. Electronically Signed   By: Lorriane Shire M.D.   On: 09/01/2019 17:43   CT Cervical Spine Wo Contrast  Result Date: 09/01/2019 CLINICAL DATA:  Headache, fever, and left-sided weakness. EXAM: CT HEAD WITHOUT CONTRAST CT CERVICAL SPINE WITHOUT CONTRAST TECHNIQUE: Multidetector CT imaging of the head and cervical spine was performed following the standard protocol without intravenous contrast. Multiplanar CT image reconstructions of the cervical spine were also generated. COMPARISON:  None. FINDINGS: CT HEAD FINDINGS Brain: No evidence of acute infarction, hemorrhage, hydrocephalus, extra-axial collection or mass lesion/mass effect. Vascular: No hyperdense vessel or unexpected calcification. Skull: Normal. Negative for fracture or focal lesion. Sinuses/Orbits: Normal. Other: None CT CERVICAL SPINE FINDINGS Alignment: Straightening of the cervical lordosis. 2 mm anterolisthesis of C3 on C4. Skull base and vertebrae:  No acute fracture. No primary bone lesion or focal pathologic process. Soft tissues and spinal canal: No prevertebral fluid or swelling. No visible canal hematoma. Disc levels:  C2-3: Negative. C3-4: Disc space narrowing. Minimal anterolisthesis. Small uncinate spurs extend into the left and right neural foraminal with slight left foraminal narrowing. No soft disc protrusions. C4-5: Disc space narrowing. Small broad-based disc osteophyte complex narrowing both lateral recesses. Moderate bilateral foraminal narrowing. C5-6: Disc space narrowing. Broad-based disc osteophyte complex with a central disc protrusion which narrows the AP diameter of the spinal canal. Moderate bilateral foraminal narrowing. C6-7: Disc space narrowing. Slight bilateral foraminal narrowing, right greater than left. C7-T1: Normal. Upper chest: Negative. Other: None IMPRESSION: 1. Normal CT scan of the head. 2. No acute abnormality of the cervical spine. Multilevel degenerative disc and joint disease. Electronically Signed   By: Lorriane Shire M.D.   On: 09/01/2019 17:43   MR BRAIN WO CONTRAST  Result Date: 09/03/2019 CLINICAL DATA:  Left-sided weakness EXAM: MRI HEAD WITHOUT CONTRAST TECHNIQUE: Multiplanar, multiecho pulse sequences of the brain and surrounding structures were obtained without intravenous contrast. COMPARISON:  None. FINDINGS: Brain: There is no acute infarction or intracranial hemorrhage. There is no intracranial mass, mass effect, or edema. There is no hydrocephalus or extra-axial fluid collection. Minimal patchy T2 hyperintensity  in the supratentorial white matter is nonspecific but may reflect minor chronic microvascular ischemic changes. Vascular: Major vessel flow voids at the skull base are preserved. Skull and upper cervical spine: Normal marrow signal is preserved. Sinuses/Orbits: Paranasal sinuses are aerated. Orbits are unremarkable. Other: Sella is unremarkable.  Mastoid air cells are clear. IMPRESSION: No  evidence of recent infarction, intracranial hemorrhage, or mass. Electronically Signed   By: Macy Mis M.D.   On: 09/03/2019 11:20   DG Chest Portable 1 View  Result Date: 09/01/2019 CLINICAL DATA:  Chest pressure. Nausea, vomiting, diarrhea. Fever and weakness. EXAM: PORTABLE CHEST 1 VIEW COMPARISON:  None. FINDINGS: The heart size and mediastinal contours are within normal limits. Both lungs are clear. The visualized skeletal structures are unremarkable. IMPRESSION: Normal exam. Electronically Signed   By: Lorriane Shire M.D.   On: 09/01/2019 17:50   ECHOCARDIOGRAM COMPLETE  Result Date: 09/03/2019   ECHOCARDIOGRAM REPORT   Patient Name:   MAYLYN NARVAIZ Date of Exam: 09/03/2019 Medical Rec #:  [id number]        Height:       65.5 in Accession #:    [id number]       Weight:       200.0 lb Date of Birth:  1961-08-06        BSA:          1.99 m Patient Age:    59 years         BP:           132/74 mmHg Patient Gender: F                HR:           77 bpm. Exam Location:  Inpatient Procedure: 2D Echo Indications:    TIA 435.9 / G45.9  History:        Patient has prior history of Echocardiogram examinations.                 Arrythmias:PAC; Risk Factors:Hypertension and Diabetes.  Sonographer:    Darlina Sicilian RDCS Referring Phys: [phone] [facility] GARBA  Sonographer Comments: Unalbe to turn on left side due to hip pain IMPRESSIONS  1. Left ventricular ejection fraction, by visual estimation, is 60 to 65%. The left ventricle has normal function. There is mildly increased left ventricular hypertrophy.  2. Global right ventricle has normal systolic function.The right ventricular size is normal. No increase in right ventricular wall thickness.  3. Left atrial size was normal.  4. Right atrial size was normal.  5. The mitral valve is normal in structure. No evidence of mitral valve regurgitation.  6. The tricuspid valve is normal in structure.  7. The aortic valve is tricuspid. Aortic valve regurgitation is not  visualized.  8. The pulmonic valve was not well visualized. Pulmonic valve regurgitation is not visualized.  9. The inferior vena cava is dilated in size with >50% respiratory variability, suggesting right atrial pressure of 8 mmHg. FINDINGS  Left Ventricle: Left ventricular ejection fraction, by visual estimation, is 60 to 65%. The left ventricle has normal function. The left ventricle has no regional wall motion abnormalities. The left ventricular internal cavity size was the left ventricle is normal in size. There is mildly increased left ventricular hypertrophy. Left ventricular diastolic parameters were normal. Right Ventricle: The right ventricular size is normal. No increase in right ventricular wall thickness. Global RV systolic function is has normal systolic function. Left Atrium: Left atrial size was normal  in size. Right Atrium: Right atrial size was normal in size Pericardium: Trivial pericardial effusion is present. Mitral Valve: The mitral valve is normal in structure. No evidence of mitral valve regurgitation. Tricuspid Valve: The tricuspid valve is normal in structure. Tricuspid valve regurgitation is not demonstrated. Aortic Valve: The aortic valve is tricuspid. Aortic valve regurgitation is not visualized. Pulmonic Valve: The pulmonic valve was not well visualized. Pulmonic valve regurgitation is not visualized. Pulmonic regurgitation is not visualized. Aorta: The aortic root and ascending aorta are structurally normal, with no evidence of dilitation. Venous: The inferior vena cava is dilated in size with greater than 50% respiratory variability, suggesting right atrial pressure of 8 mmHg. IAS/Shunts: The interatrial septum was not well visualized.  LEFT VENTRICLE PLAX 2D LVIDd:         4.20 cm  Diastology LVIDs:         2.70 cm  LV e' lateral:   6.53 cm/s LV PW:         1.00 cm  LV E/e' lateral: 11.0 LV IVS:        1.40 cm  LV e' medial:    6.85 cm/s LVOT diam:     2.10 cm  LV E/e' medial:  10.5  LV SV:         52 ml LV SV Index:   24.78 LVOT Area:     3.46 cm  RIGHT VENTRICLE RV S prime:     26.10 cm/s LEFT ATRIUM             Index LA diam:        3.30 cm 1.66 cm/m LA Vol (A2C):   39.4 ml 19.80 ml/m LA Vol (A4C):   53.8 ml 27.04 ml/m LA Biplane Vol: 50.0 ml 25.13 ml/m  AORTIC VALVE LVOT Vmax:   70.90 cm/s LVOT Vmean:  45.900 cm/s LVOT VTI:    0.147 m  AORTA Ao Root diam: 3.00 cm Ao Asc diam:  3.40 cm MITRAL VALVE MV Area (PHT): 3.77 cm             SHUNTS MV PHT:        58.29 msec           Systemic VTI:  0.15 m MV Decel Time: 201 msec             Systemic Diam: 2.10 cm MV E velocity: 71.60 cm/s 103 cm/s MV A velocity: 75.00 cm/s 70.3 cm/s MV E/A ratio:  0.95       1.5  Oswaldo Milian MD Electronically signed by Oswaldo Milian MD Signature Date/Time: 09/03/2019/6:10:37 PM    Final    VAS US CAROTID (at [facility] and [facility] only)  Result Date: 09/03/2019 Carotid Arterial Duplex Study Indications:       TIA. Risk Factors:      Hypertension, hyperlipidemia, Diabetes. Limitations        Today's exam was limited due to the body habitus of the                    patient. Comparison Study:  No prior study. Performing Technologist: Maudry Mayhew MHA, RDMS, RVT, RDCS  Examination Guidelines: A complete evaluation includes B-mode imaging, spectral Doppler, color Doppler, and power Doppler as needed of all accessible portions of each vessel. Bilateral testing is considered an integral part of a complete examination. Limited examinations for reoccurring indications may be performed as noted.  Right Carotid Findings: +----------+--------+--------+--------+------------------+------------------+  PSV cm/sEDV cm/sStenosisPlaque DescriptionComments           +----------+--------+--------+--------+------------------+------------------+ CCA Prox  88      20                                intimal thickening +----------+--------+--------+--------+------------------+------------------+ CCA  Distal86      25                                intimal thickening +----------+--------+--------+--------+------------------+------------------+ ICA Prox  101     22                                                   +----------+--------+--------+--------+------------------+------------------+ ICA Distal64      23                                                   +----------+--------+--------+--------+------------------+------------------+ ECA       77      17                                                   +----------+--------+--------+--------+------------------+------------------+ +----------+--------+-------+----------------+-------------------+           PSV cm/sEDV cmsDescribe        Arm Pressure (mmHG) +----------+--------+-------+----------------+-------------------+ Subclavian150            Multiphasic, WNL                    +----------+--------+-------+----------------+-------------------+ +---------+--------+--+--------+--+---------+ VertebralPSV cm/s58EDV cm/s12Antegrade +---------+--------+--+--------+--+---------+  Left Carotid Findings: +----------+--------+-------+--------+--------------------------------+--------+           PSV cm/sEDV    StenosisPlaque Description              Comments                   cm/s                                                    +----------+--------+-------+--------+--------------------------------+--------+ CCA Prox  108     17             smooth, heterogenous and                                                  calcific                                 +----------+--------+-------+--------+--------------------------------+--------+ CCA Distal91      27                                                      +----------+--------+-------+--------+--------------------------------+--------+  ICA Prox  75      24             smooth and heterogenous                   +----------+--------+-------+--------+--------------------------------+--------+ ICA Distal67      22                                                      +----------+--------+-------+--------+--------------------------------+--------+ ECA       95      25                                                      +----------+--------+-------+--------+--------------------------------+--------+ +----------+--------+--------+----------------+-------------------+           PSV cm/sEDV cm/sDescribe        Arm Pressure (mmHG) +----------+--------+--------+----------------+-------------------+ [id number]             Multiphasic, WNL                    +----------+--------+--------+----------------+-------------------+ +---------+--------+--+--------+--+---------+ VertebralPSV cm/s62EDV cm/s14Antegrade +---------+--------+--+--------+--+---------+  Summary: Right Carotid: Velocities in the right ICA are consistent with a 1-39% stenosis. Left Carotid: Velocities in the left ICA are consistent with a 1-39% stenosis. Vertebrals:  Bilateral vertebral arteries demonstrate antegrade flow. Subclavians: Normal flow hemodynamics were seen in bilateral subclavian              arteries. *See table(s) above for measurements and observations.  Electronically signed by Antony Contras MD on 09/03/2019 at 12:14:03 PM.    Final       Subjective: Some nausea. Diarrhea today.  Discharge Exam: Vitals:   09/03/19 1640 09/03/19 2206  BP: (!) 139/95 (!) 149/87  Pulse: 71 71  Resp: 18 14  Temp: 98.4 F (36.9 C) 97.8 F (36.6 C)  SpO2: 100% 98%   Vitals:   09/03/19 0127 09/03/19 0758 09/03/19 1640 09/03/19 2206  BP: 126/82 132/74 (!) 139/95 (!) 149/87  Pulse: 75 77 71 71  Resp: 19 18 18 14   Temp: 98.2 F (36.8 C) 98.3 F (36.8 C) 98.4 F (36.9 C) 97.8 F (36.6 C)  TempSrc: Oral Oral Oral   SpO2: 100% 100% 100% 98%  Weight:      Height:        General: Pt is alert, awake, not in acute  distress Cardiovascular: RRR, S1/S2 +, no rubs, no gallops Respiratory: CTA bilaterally, no wheezing, no rhonchi Abdominal: Soft, NT, ND, bowel sounds + Extremities: no edema, no cyanosis    The results of significant diagnostics from this hospitalization (including imaging, microbiology, ancillary and laboratory) are listed below for reference.     Microbiology: Recent Results (from the past 240 hour(s))  SARS Coronavirus 2 Ag (30 min TAT) - Nasal Swab (BD Veritor Kit)     Status: None   Collection Time: 09/01/19  3:24 PM   Specimen: Nasal Swab (BD Veritor Kit)  Result Value Ref Range Status   SARS Coronavirus 2 Ag NEGATIVE NEGATIVE Final    Comment: (NOTE) SARS-CoV-2 antigen NOT DETECTED.  Negative results are presumptive.  Negative results do not preclude SARS-CoV-2 infection and should not be used as the sole  basis for treatment or other patient management decisions, including infection  control decisions, particularly in the presence of clinical signs and  symptoms consistent with COVID-19, or in those who have been in contact with the virus.  Negative results must be combined with clinical observations, patient history, and epidemiological information. The expected result is Negative. Fact Sheet for Patients: PodPark.tn Fact Sheet for Healthcare Providers: GiftContent.is This test is not yet approved or cleared by the [place] FDA and  has been authorized for detection and/or diagnosis of SARS-CoV-2 by FDA under an Emergency Use Authorization (EUA).  This EUA will remain in effect (meaning this test can be used) for the duration of  the COVID-19 de claration under Section 564(b)(1) of the Act, 21 U.S.C. section 360bbb-3(b)(1), unless the authorization is terminated or revoked sooner. Performed at [facility], [address] [place], [place] [address]   Blood Culture (routine x 2)     Status:  None (Preliminary result)   Collection Time: 09/01/19  5:45 PM   Specimen: BLOOD LEFT HAND  Result Value Ref Range Status   Specimen Description   Final    BLOOD LEFT HAND Performed at [facility], [address] [place], [place] [address]    Special Requests   Final    BOTTLES DRAWN AEROBIC AND ANAEROBIC Blood Culture adequate volume Performed at [facility], [address] [place], [place] [address]    Culture   Final    NO GROWTH 3 DAYS Performed at [facility], [place] [address] [place], [place] [address]    Report Status PENDING  Incomplete  SARS CORONAVIRUS 2 (TAT 6-24 HRS) Nasopharyngeal Nasopharyngeal Swab     Status: None   Collection Time: 09/01/19  6:04 PM   Specimen: Nasopharyngeal Swab  Result Value Ref Range Status   SARS Coronavirus 2 NEGATIVE NEGATIVE Final    Comment: (NOTE) SARS-CoV-2 target nucleic acids are NOT DETECTED. The SARS-CoV-2 RNA is generally detectable in upper and lower respiratory specimens during the acute phase of infection. Negative results do not preclude SARS-CoV-2 infection, do not rule out co-infections with other pathogens, and should not be used as the sole basis for treatment or other patient management decisions. Negative results must be combined with clinical observations, patient history, and epidemiological information. The expected result is Negative. Fact Sheet for Patients: SugarRoll.be Fact Sheet for Healthcare Providers: https://www.woods-mathews.com/ This test is not yet approved or cleared by the [place] FDA and  has been authorized for detection and/or diagnosis of SARS-CoV-2 by FDA under an Emergency Use Authorization (EUA). This EUA will remain  in effect (meaning this test can be used) for the duration of the COVID-19 declaration under Section 56 4(b)(1) of the Act, 21 U.S.C. section 360bbb-3(b)(1), unless the authorization is terminated  or revoked sooner. Performed at [facility], [place] [address] [place], [place] [address]   Blood Culture (routine x 2)     Status: None (Preliminary result)   Collection Time: 09/01/19  8:05 PM   Specimen: BLOOD  Result Value Ref Range Status   Specimen Description   Final    BLOOD BLOOD RIGHT FOREARM Performed at [facility], [address] [place], [place] [address]    Special Requests   Final    BOTTLES DRAWN AEROBIC AND ANAEROBIC Blood Culture adequate volume Performed at [facility], [address] [place], [place] [address]  Culture   Final    NO GROWTH 3 DAYS Performed at [facility], [place] [address], [place], [place] [address]    Report Status PENDING  Incomplete  Urine culture     Status: None   Collection Time: 09/01/19  8:30 PM   Specimen: In/Out Cath Urine  Result Value Ref Range Status   Specimen Description   Final    IN/OUT CATH URINE Performed at [facility], [address] [place], [place] [address]    Special Requests   Final    NONE Performed at [facility], [address] [place], [place] [address]    Culture   Final    NO GROWTH Performed at [facility], [place] [address], [place], [place] [address]    Report Status 09/02/2019 FINAL  Final     Labs: BNP (last 3 results) No results for input(s): BNP in the last 8760 hours. Basic Metabolic Panel: Recent Labs  Lab 09/01/19 1524 09/02/19 2341 09/03/19 1425 09/03/19 2109  NA 135  --  137  --   K 5.0  --  5.5* 4.7  CL 106  --  113*  --   CO2 13*  --  20*  --   GLUCOSE 277*  --  144*  --   BUN 32*  --  19  --   CREATININE 2.39* 1.38* 1.17*  --   CALCIUM 9.3  --  8.6*  --    Liver Function Tests: Recent Labs  Lab 09/01/19 1524  AST 20  ALT 18  ALKPHOS 111  BILITOT 0.5  PROT 7.9  ALBUMIN 4.4   Recent Labs  Lab 09/01/19 1524  LIPASE 69*   No results for input(s): AMMONIA in the last 168 hours. CBC: Recent Labs   Lab 09/01/19 1524 09/02/19 2341  WBC 10.9* 9.1  NEUTROABS 7.5  --   HGB 14.0 12.1  HCT 42.0 35.6*  MCV 95.2 94.9  PLT 397 306   Cardiac Enzymes: No results for input(s): CKTOTAL, CKMB, CKMBINDEX, TROPONINI in the last 168 hours. BNP: Invalid input(s): POCBNP CBG: Recent Labs  Lab 09/03/19 1638 09/03/19 2202 09/04/19 0726 09/04/19 0753 09/04/19 1133  GLUCAP 87 145* 48* 84 133*   D-Dimer No results for input(s): DDIMER in the last 72 hours. Hgb A1c Recent Labs    09/02/19 2341  HGBA1C 8.4*   Lipid Profile Recent Labs    09/02/19 2341  CHOL 151  HDL 32*  LDLCALC 100*  TRIG 94  CHOLHDL 4.7   Thyroid function studies No results for input(s): TSH, T4TOTAL, T3FREE, THYROIDAB in the last 72 hours.  Invalid input(s): FREET3 Anemia work up No results for input(s): VITAMINB12, FOLATE, FERRITIN, TIBC, IRON, RETICCTPCT in the last 72 hours. Urinalysis    Component Value Date/Time   COLORURINE YELLOW 09/01/2019 2005   APPEARANCEUR HAZY (A) 09/01/2019 2005   LABSPEC 1.025 09/01/2019 2005   PHURINE 5.5 09/01/2019 2005   GLUCOSEU NEGATIVE 09/01/2019 2005   HGBUR NEGATIVE 09/01/2019 2005   [place] NEGATIVE 09/01/2019 2005   [place] NEGATIVE 09/01/2019 2005   PROTEINUR 30 (A) 09/01/2019 2005   NITRITE NEGATIVE 09/01/2019 2005   LEUKOCYTESUR NEGATIVE 09/01/2019 2005   Sepsis Labs Invalid input(s): PROCALCITONIN,  WBC,  LACTICIDVEN Microbiology Recent Results (from the past 240 hour(s))  SARS Coronavirus 2 Ag (30 min TAT) - Nasal Swab (BD Veritor Kit)     Status: None   Collection Time: 09/01/19  3:24 PM   Specimen: Nasal Swab (BD Veritor Kit)  Result Value Ref Range Status   SARS Coronavirus 2 Ag NEGATIVE NEGATIVE Final    Comment: (NOTE) SARS-CoV-2 antigen NOT DETECTED.  Negative results are presumptive.  Negative results do not preclude SARS-CoV-2 infection and should not be used as the sole basis for treatment or other patient management decisions,  including infection  control decisions, particularly in the presence of clinical signs and  symptoms consistent with COVID-19, or in those who have been in contact with the virus.  Negative results must be combined with clinical observations, patient history, and epidemiological information. The expected result is Negative. Fact Sheet for Patients: PodPark.tn Fact Sheet for Healthcare Providers: GiftContent.is This test is not yet approved or cleared by the [place] FDA and  has been authorized for detection and/or diagnosis of SARS-CoV-2 by FDA under an Emergency Use Authorization (EUA).  This EUA will remain in effect (meaning this test can be used) for the duration of  the COVID-19 de claration under Section 564(b)(1) of the Act, 21 U.S.C. section 360bbb-3(b)(1), unless the authorization is terminated or revoked sooner. Performed at [facility], [address] [place], [place] [address]   Blood Culture (routine x 2)     Status: None (Preliminary result)   Collection Time: 09/01/19  5:45 PM   Specimen: BLOOD LEFT HAND  Result Value Ref Range Status   Specimen Description   Final    BLOOD LEFT HAND Performed at [facility], [address] [place], [place] [address]    Special Requests   Final    BOTTLES DRAWN AEROBIC AND ANAEROBIC Blood Culture adequate volume Performed at [facility] [address] [place], [place] [address]    Culture   Final    NO GROWTH 3 DAYS Performed at [facility], [place] [address], [place], [place] [address]    Report Status PENDING  Incomplete  SARS CORONAVIRUS 2 (TAT 6-24 HRS) Nasopharyngeal Nasopharyngeal Swab     Status: None   Collection Time: 09/01/19  6:04 PM   Specimen: Nasopharyngeal Swab  Result Value Ref Range Status   SARS Coronavirus 2 NEGATIVE NEGATIVE Final    Comment: (NOTE) SARS-CoV-2 target nucleic acids are NOT  DETECTED. The SARS-CoV-2 RNA is generally detectable in upper and lower respiratory specimens during the acute phase of infection. Negative results do not preclude SARS-CoV-2 infection, do not rule out co-infections with other pathogens, and should not be used as the sole basis for treatment or other patient management decisions. Negative results must be combined with clinical observations, patient history, and epidemiological information. The expected result is Negative. Fact Sheet for Patients: SugarRoll.be Fact Sheet for Healthcare Providers: https://www.woods-mathews.com/ This test is not yet approved or cleared by the [place] FDA and  has been authorized for detection and/or diagnosis of SARS-CoV-2 by FDA under an Emergency Use Authorization (EUA). This EUA will remain  in effect (meaning this test can be used) for the duration of the COVID-19 declaration under Section 56 4(b)(1) of the Act, 21 U.S.C. section 360bbb-3(b)(1), unless the authorization is terminated or revoked sooner. Performed at [facility], [place] [address], [place], [place] [address]   Blood Culture (routine x 2)     Status: None (Preliminary result)   Collection Time: 09/01/19  8:05 PM   Specimen: BLOOD  Result Value Ref Range Status   Specimen Description   Final  BLOOD BLOOD RIGHT FOREARM Performed at [facility], [address] [place], [place] [address]    Special Requests   Final    BOTTLES DRAWN AEROBIC AND ANAEROBIC Blood Culture adequate volume Performed at [facility], [address] [place], [place] [address]    Culture   Final    NO GROWTH 3 DAYS Performed at [facility], [place] [address], [place], [place] [address]    Report Status PENDING  Incomplete  Urine culture     Status: None   Collection Time: 09/01/19  8:30 PM   Specimen: In/Out Cath Urine  Result Value Ref Range Status   Specimen Description    Final    IN/OUT CATH URINE Performed at [facility], [address] [place], [place] [address]    Special Requests   Final    NONE Performed at [facility], [address] [place], [place] [address]    Culture   Final    NO GROWTH Performed at [facility], [place] [address] [place], [place] [address]    Report Status 09/02/2019 FINAL  Final     Time coordinating discharge: 35 minutes  SIGNED:   Cordelia Poche, MD [facility] 09/04/2019, 3:26 PM

## 2019-09-04 NOTE — Progress Notes (Signed)
Patient was stable at discharge. I removed her IV. We reviewed the discharge education. Patientverbalized understanding and had no further questions. Patient left with belongings in hand.

## 2019-09-04 NOTE — TOC Transition Note (Signed)
Transition of Care [facility]) - CM/SW Discharge Note   Patient Details  Name: Amy Nunez MRN: [id number] Date of Birth: 11-Aug-1961  [facility] of [facility]) CM/SW Contact:  Sharin Mons, RN Phone Number: 09/04/2019, 3:47 PM   Clinical Narrative:    Presented with L sided weakness. Hx of Diabetes, HTN, IBS. From home with daughter.  NCM spoke with pt about d/c plan, home with home health services. Pt declines [facility] services and DME. States she doesn't need any help. States daughter will help her if needed.  Pt will transition to home today. Daughter to provide transportation to home.  Final next level of care: Home/Self Care Barriers to Discharge: No Barriers Identified   Patient Goals and CMS Choice        Discharge Placement                       Discharge Plan and Services                DME Arranged: (Pt declined DME ordered)         HH Arranged: (Pt declined [employer] services)          Social Determinants of Health (SDOH) Interventions     Readmission Risk Interventions No flowsheet data found.

## 2019-09-04 NOTE — Discharge Instructions (Signed)
Amy Nunez,  You were in the hospital because of left sided weakness. You did not have a stroke but this could be related to your previous stroke or headaches. Please do not take your lisinopril if you become dehydrated.

## 2019-09-04 NOTE — Progress Notes (Signed)
Reason for consult:   Subjective: Patient presenting with hypotension and dizziness as well as dehydration, nausea and headache into 1 week.  Also complained of left-sided weakness where she had prior stroke.  Patient's left-sided weakness is improved since admission.  She continues to complain of a headache 5 x 10 describes it as frontal pressure-like headache associated with nausea.  Denies photophobia.  Improves with rest and sleep, worsens with activity.  ROS: negative except above  Examination  Vital signs in last 24 hours: Temp:  [97.8 F (36.6 C)-98.4 F (36.9 C)] 97.8 F (36.6 C) (01/06 26-Nov-2204) Pulse Rate:  [71] 71 (01/06 November 26, 2204) Resp:  [14-18] 14 (01/06 Nov 26, 2204) BP: (139-149)/(87-95) 149/87 (01/06 2206) SpO2:  [98 %-100 %] 98 % (01/06 11-26-2204)  General: lying in bed CVS: pulse-normal rate and rhythm RS: breathing comfortably Extremities: normal   Neuro: MS: Alert, oriented, follows commands CN: pupils equal and reactive,  EOMI, face symmetric, tongue midline, normal sensation over face, Motor: 5/5 strength in all 4 extremities Reflexes: 2+ bilaterally over patella, biceps, plantars: flexor Coordination: normal Gait: not tested  Basic Metabolic Panel: Recent Labs  Lab 09/01/19 1524 09/02/19 2341 09/03/19 1425 09/03/19 2109  NA 135  --  137  --   K 5.0  --  5.5* 4.7  CL 106  --  113*  --   CO2 13*  --  20*  --   GLUCOSE 277*  --  144*  --   BUN 32*  --  19  --   CREATININE 2.39* 1.38* 1.17*  --   CALCIUM 9.3  --  8.6*  --     CBC: Recent Labs  Lab 09/01/19 1524 09/02/19 2341  WBC 10.9* 9.1  NEUTROABS 7.5  --   HGB 14.0 12.1  HCT 42.0 35.6*  MCV 95.2 94.9  PLT 397 306     Coagulation Studies: Recent Labs    09/01/19 2003-11-27  LABPROT 13.5  INR 1.0    Imaging Reviewed:  MRI brain was obtained on admission which was negative for acute infarct.   ASSESSMENT AND PLAN  Left-sided weakness due to recrudescence of old stroke symptoms Migraine  headache AKI due to dehydration  Recommendations Recommend migraine cocktail of Toradol, Compazine and Reglan Advised against analgesic overuse headache Reassured patient she has not had an acute stroke   Evadean Sproule [facility] Pager Number [phone] For questions after 7pm please refer to AMION to reach the Neurologist on call

## 2019-09-04 NOTE — Plan of Care (Signed)
Pt's care plan goals have been met. She is adequate for discharge.

## 2019-09-04 NOTE — Progress Notes (Signed)
  Inpatient Diabetes Program Recommendations  AACE/ADA: New Consensus Statement on Inpatient Glycemic Control (2015)  Target Ranges:  Prepandial:   less than 140 mg/dL      Peak postprandial:   less than 180 mg/dL (1-2 hours)      Critically ill patients:  140 - 180 mg/dL   Lab Results  Component Value Date   GLUCAP 133 (H) 09/04/2019   HGBA1C 8.4 (H) 09/02/2019    Review of Glycemic Control  Inpatient Diabetes Program Recommendations:   Fasting CBG 48 this am -Decrease Lantus to 40 units daily -Add Novolog 2 units tid meal coverage if eats 50% meals Amion message to Dr. Georgena Spurling.with recommendations.  Thank you, Nani Gasser. Taris Galindo, RN, MSN, CDE  Diabetes Coordinator Inpatient Glycemic Control Team Team Pager [phone] (8am-5pm) 09/04/2019 11:46 AM

## 2019-09-06 LAB — CULTURE, BLOOD (ROUTINE X 2)
Culture: NO GROWTH
Culture: NO GROWTH
Special Requests: ADEQUATE
Special Requests: ADEQUATE

## 2019-09-08 ENCOUNTER — Emergency Department (HOSPITAL_BASED_OUTPATIENT_CLINIC_OR_DEPARTMENT_OTHER): Payer: Medicare HMO

## 2019-09-08 ENCOUNTER — Other Ambulatory Visit: Payer: Self-pay

## 2019-09-08 ENCOUNTER — Encounter (HOSPITAL_BASED_OUTPATIENT_CLINIC_OR_DEPARTMENT_OTHER): Payer: Self-pay | Admitting: *Deleted

## 2019-09-08 ENCOUNTER — Emergency Department (HOSPITAL_BASED_OUTPATIENT_CLINIC_OR_DEPARTMENT_OTHER)
Admission: EM | Admit: 2019-09-08 | Discharge: 2019-09-09 | Disposition: A | Discharge status: Home / Self Care | Payer: Medicare HMO | Attending: Emergency Medicine | Admitting: Emergency Medicine

## 2019-09-08 DIAGNOSIS — R079 Chest pain, unspecified: Secondary | ICD-10-CM | POA: Insufficient documentation

## 2019-09-08 DIAGNOSIS — Z5321 Procedure and treatment not carried out due to patient leaving prior to being seen by health care provider: Secondary | ICD-10-CM | POA: Diagnosis not present

## 2019-09-08 DIAGNOSIS — R52 Pain, unspecified: Secondary | ICD-10-CM

## 2019-09-08 DIAGNOSIS — Z20822 Contact with and (suspected) exposure to covid-19: Secondary | ICD-10-CM | POA: Diagnosis not present

## 2019-09-08 LAB — CBC WITH DIFFERENTIAL/PLATELET
Abs Immature Granulocytes: 0.03 10*3/uL (ref 0.00–0.07)
Basophils Absolute: 0 10*3/uL (ref 0.0–0.1)
Basophils Relative: 0 %
Eosinophils Absolute: 0.2 10*3/uL (ref 0.0–0.5)
Eosinophils Relative: 2 %
HCT: 35.4 % — ABNORMAL LOW (ref 36.0–46.0)
Hemoglobin: 11.5 g/dL — ABNORMAL LOW (ref 12.0–15.0)
Immature Granulocytes: 0 %
Lymphocytes Relative: 29 %
Lymphs Abs: 2.8 10*3/uL (ref 0.7–4.0)
MCH: 31.7 pg (ref 26.0–34.0)
MCHC: 32.5 g/dL (ref 30.0–36.0)
MCV: 97.5 fL (ref 80.0–100.0)
Monocytes Absolute: 0.6 10*3/uL (ref 0.1–1.0)
Monocytes Relative: 7 %
Neutro Abs: 6 10*3/uL (ref 1.7–7.7)
Neutrophils Relative %: 62 %
Platelets: 315 10*3/uL (ref 150–400)
RBC: 3.63 MIL/uL — ABNORMAL LOW (ref 3.87–5.11)
RDW: 13.4 % (ref 11.5–15.5)
WBC: 9.7 10*3/uL (ref 4.0–10.5)
nRBC: 0 % (ref 0.0–0.2)

## 2019-09-08 LAB — URINALYSIS, ROUTINE W REFLEX MICROSCOPIC
Glucose, UA: NEGATIVE mg/dL
Hgb urine dipstick: NEGATIVE
Ketones, ur: 15 mg/dL — AB
Leukocytes,Ua: NEGATIVE
Nitrite: NEGATIVE
Protein, ur: NEGATIVE mg/dL
Specific Gravity, Urine: >1.03 — ABNORMAL HIGH (ref 1.005–1.030)
pH: 5.5 (ref 5.0–8.0)

## 2019-09-08 LAB — BASIC METABOLIC PANEL
Anion gap: 11 (ref 5–15)
BUN: 14 mg/dL (ref 6–20)
CO2: 21 mmol/L — ABNORMAL LOW (ref 22–32)
Calcium: 9.2 mg/dL (ref 8.9–10.3)
Chloride: 107 mmol/L (ref 98–111)
Creatinine, Ser: 1.28 mg/dL — ABNORMAL HIGH (ref 0.44–1.00)
GFR calc Af Amer: 53 mL/min — ABNORMAL LOW (ref >60)
GFR calc non Af Amer: 45 mL/min — ABNORMAL LOW (ref >60)
Glucose, Bld: 178 mg/dL — ABNORMAL HIGH (ref 70–99)
Potassium: 3.8 mmol/L (ref 3.5–5.1)
Sodium: 139 mmol/L (ref 135–145)

## 2019-09-08 LAB — SARS CORONAVIRUS 2 AG (30 MIN TAT): SARS Coronavirus 2 Ag: NEGATIVE

## 2019-09-08 LAB — TROPONIN I (HIGH SENSITIVITY): Troponin I (High Sensitivity): 3 ng/L (ref <18)

## 2019-09-08 NOTE — ED Triage Notes (Signed)
Chest pressure off and on x 2 weeks. The left side of her body hurts. Diarrhea. She is taking Zofran and Phenergan for vomiting. She was discharged from [facility] 1/7 with the same thing. No diagnosis was made. Her Covid was negative at that time.

## 2019-09-08 NOTE — ED Notes (Signed)
Reports pain from the top of her head to the bottom of her foot on the L side.  Pt. Said she is having pain in lower back and abd. Pian.    Pt. Reports she just got out of hospital for same.  Pt. Said she is fatigued and tired.  She reports not being around anyone with COVID  Pt. Said she is just tired.  She reports she lives with  Her daughter and her grandchildren.

## 2019-09-09 NOTE — ED Notes (Signed)
Pt called out and states she is leaving. Pt left prior signing out AMA.

## 2019-09-15 ENCOUNTER — Encounter (HOSPITAL_BASED_OUTPATIENT_CLINIC_OR_DEPARTMENT_OTHER): Payer: Self-pay | Admitting: *Deleted

## 2019-09-15 ENCOUNTER — Emergency Department (HOSPITAL_BASED_OUTPATIENT_CLINIC_OR_DEPARTMENT_OTHER)
Admission: EM | Admit: 2019-09-15 | Discharge: 2019-09-16 | Disposition: A | Discharge status: Home / Self Care | Payer: Medicare Other | Source: Home / Self Care | Attending: Emergency Medicine | Admitting: Emergency Medicine

## 2019-09-15 ENCOUNTER — Other Ambulatory Visit: Payer: Self-pay

## 2019-09-15 ENCOUNTER — Telehealth: Payer: Self-pay | Admitting: Gastroenterology

## 2019-09-15 DIAGNOSIS — K589 Irritable bowel syndrome without diarrhea: Secondary | ICD-10-CM | POA: Diagnosis not present

## 2019-09-15 DIAGNOSIS — G894 Chronic pain syndrome: Secondary | ICD-10-CM | POA: Diagnosis not present

## 2019-09-15 DIAGNOSIS — R109 Unspecified abdominal pain: Secondary | ICD-10-CM | POA: Insufficient documentation

## 2019-09-15 DIAGNOSIS — G8929 Other chronic pain: Secondary | ICD-10-CM | POA: Insufficient documentation

## 2019-09-15 DIAGNOSIS — Z905 Acquired absence of kidney: Secondary | ICD-10-CM | POA: Diagnosis not present

## 2019-09-15 DIAGNOSIS — E860 Dehydration: Secondary | ICD-10-CM | POA: Diagnosis not present

## 2019-09-15 DIAGNOSIS — N1831 Chronic kidney disease, stage 3a: Secondary | ICD-10-CM | POA: Diagnosis not present

## 2019-09-15 DIAGNOSIS — N179 Acute kidney failure, unspecified: Secondary | ICD-10-CM

## 2019-09-15 DIAGNOSIS — Z794 Long term (current) use of insulin: Secondary | ICD-10-CM | POA: Insufficient documentation

## 2019-09-15 DIAGNOSIS — I129 Hypertensive chronic kidney disease with stage 1 through stage 4 chronic kidney disease, or unspecified chronic kidney disease: Secondary | ICD-10-CM | POA: Diagnosis not present

## 2019-09-15 DIAGNOSIS — R7401 Elevation of levels of liver transaminase levels: Secondary | ICD-10-CM | POA: Diagnosis not present

## 2019-09-15 DIAGNOSIS — Z85528 Personal history of other malignant neoplasm of kidney: Secondary | ICD-10-CM | POA: Diagnosis not present

## 2019-09-15 DIAGNOSIS — Z7952 Long term (current) use of systemic steroids: Secondary | ICD-10-CM | POA: Diagnosis not present

## 2019-09-15 DIAGNOSIS — N281 Cyst of kidney, acquired: Secondary | ICD-10-CM | POA: Diagnosis not present

## 2019-09-15 DIAGNOSIS — E119 Type 2 diabetes mellitus without complications: Secondary | ICD-10-CM | POA: Insufficient documentation

## 2019-09-15 DIAGNOSIS — M545 Low back pain: Secondary | ICD-10-CM | POA: Diagnosis not present

## 2019-09-15 DIAGNOSIS — Z87891 Personal history of nicotine dependence: Secondary | ICD-10-CM | POA: Diagnosis not present

## 2019-09-15 DIAGNOSIS — I10 Essential (primary) hypertension: Secondary | ICD-10-CM | POA: Insufficient documentation

## 2019-09-15 DIAGNOSIS — Z20822 Contact with and (suspected) exposure to covid-19: Secondary | ICD-10-CM | POA: Diagnosis not present

## 2019-09-15 DIAGNOSIS — Z880 Allergy status to penicillin: Secondary | ICD-10-CM | POA: Diagnosis not present

## 2019-09-15 DIAGNOSIS — E1122 Type 2 diabetes mellitus with diabetic chronic kidney disease: Secondary | ICD-10-CM | POA: Diagnosis not present

## 2019-09-15 DIAGNOSIS — Z79899 Other long term (current) drug therapy: Secondary | ICD-10-CM | POA: Insufficient documentation

## 2019-09-15 LAB — COMPREHENSIVE METABOLIC PANEL
ALT: 24 U/L (ref 0–44)
AST: 24 U/L (ref 15–41)
Albumin: 4.5 g/dL (ref 3.5–5.0)
Alkaline Phosphatase: 100 U/L (ref 38–126)
Anion gap: 12 (ref 5–15)
BUN: 26 mg/dL — ABNORMAL HIGH (ref 6–20)
CO2: 22 mmol/L (ref 22–32)
Calcium: 9.5 mg/dL (ref 8.9–10.3)
Chloride: 104 mmol/L (ref 98–111)
Creatinine, Ser: 2.1 mg/dL — ABNORMAL HIGH (ref 0.44–1.00)
GFR calc Af Amer: 29 mL/min — ABNORMAL LOW (ref >60)
GFR calc non Af Amer: 25 mL/min — ABNORMAL LOW (ref >60)
Glucose, Bld: 177 mg/dL — ABNORMAL HIGH (ref 70–99)
Potassium: 4.4 mmol/L (ref 3.5–5.1)
Sodium: 138 mmol/L (ref 135–145)
Total Bilirubin: 0.5 mg/dL (ref 0.3–1.2)
Total Protein: 8.4 g/dL — ABNORMAL HIGH (ref 6.5–8.1)

## 2019-09-15 LAB — LIPASE, BLOOD: Lipase: 50 U/L (ref 11–51)

## 2019-09-15 LAB — CBC WITH DIFFERENTIAL/PLATELET
Abs Immature Granulocytes: 0.05 10*3/uL (ref 0.00–0.07)
Basophils Absolute: 0 10*3/uL (ref 0.0–0.1)
Basophils Relative: 0 %
Eosinophils Absolute: 0.2 10*3/uL (ref 0.0–0.5)
Eosinophils Relative: 2 %
HCT: 37.9 % (ref 36.0–46.0)
Hemoglobin: 12.5 g/dL (ref 12.0–15.0)
Immature Granulocytes: 0 %
Lymphocytes Relative: 24 %
Lymphs Abs: 2.9 10*3/uL (ref 0.7–4.0)
MCH: 31.9 pg (ref 26.0–34.0)
MCHC: 33 g/dL (ref 30.0–36.0)
MCV: 96.7 fL (ref 80.0–100.0)
Monocytes Absolute: 0.9 10*3/uL (ref 0.1–1.0)
Monocytes Relative: 7 %
Neutro Abs: 7.9 10*3/uL — ABNORMAL HIGH (ref 1.7–7.7)
Neutrophils Relative %: 67 %
Platelets: 371 10*3/uL (ref 150–400)
RBC: 3.92 MIL/uL (ref 3.87–5.11)
RDW: 13.3 % (ref 11.5–15.5)
WBC: 12 10*3/uL — ABNORMAL HIGH (ref 4.0–10.5)
nRBC: 0 % (ref 0.0–0.2)

## 2019-09-15 LAB — URINALYSIS, ROUTINE W REFLEX MICROSCOPIC
Bilirubin Urine: NEGATIVE
Glucose, UA: NEGATIVE mg/dL
Hgb urine dipstick: NEGATIVE
Ketones, ur: NEGATIVE mg/dL
Leukocytes,Ua: NEGATIVE
Nitrite: NEGATIVE
Protein, ur: NEGATIVE mg/dL
Specific Gravity, Urine: >1.03 — ABNORMAL HIGH (ref 1.005–1.030)
pH: 5.5 (ref 5.0–8.0)

## 2019-09-15 MED ORDER — ONDANSETRON HCL 4 MG/2ML IJ SOLN
4.0000 mg | Freq: Once | INTRAMUSCULAR | Status: AC
  Administered 2019-09-15: 4 mg via INTRAVENOUS
  Filled 2019-09-15: qty 2

## 2019-09-15 MED ORDER — SODIUM CHLORIDE 0.9 % IV BOLUS
1000.0000 mL | Freq: Once | INTRAVENOUS | Status: AC
  Administered 2019-09-15: 1000 mL via INTRAVENOUS

## 2019-09-15 MED ORDER — SODIUM CHLORIDE 0.9 % IV BOLUS
1000.0000 mL | Freq: Once | INTRAVENOUS | Status: AC
  Administered 2019-09-16: 1000 mL via INTRAVENOUS

## 2019-09-15 MED ORDER — KETAMINE HCL 10 MG/ML IJ SOLN
0.2000 mg/kg | Freq: Once | INTRAMUSCULAR | Status: AC
  Administered 2019-09-15: 19 mg via INTRAVENOUS
  Filled 2019-09-15: qty 1

## 2019-09-15 NOTE — Telephone Encounter (Signed)
Pt is requesting to transfer to [place] in [place] office because she now lives in [place]. Pt is requesting Dr.Gupta review records. Records will be placed on Dr.Gupta's desk for review.

## 2019-09-15 NOTE — ED Provider Notes (Signed)
[facility] EMERGENCY DEPARTMENT Provider Note   CSN: [id number] Arrival date & time: 09/15/19  1750     History Chief Complaint  Patient presents with  . Abdominal Pain    Amy Nunez is a 59 y.o. female.  Patient with history of kidney cancer status post right nephrectomy, diabetes, IBS --presents with ongoing nausea, vomiting, diarrhea, and abdominal pain.  Symptoms have been present for several months.  I saw the patient for this in 04/2019.  Patient has had CT imaging which does not demonstrate the cause of her pain.  She was last imaged 2 weeks ago prior to admission to the hospital for abdominal pain and weakness.  Patient is on morphine and hydrocodone chronically.  She denies any fevers or chills.  No chest pain or shortness of breath.  She had multiple episodes of diarrhea last night but this was improved with Imodium today.  She has generalized abdominal pain worse on the left side.  She states multiple episodes of vomiting today.  She has difficulty keeping down fluids or solids and states that when she does keep fluids down they "go right through her" referring to her diarrhea.  She states that her primary care doctor is working on trying to get her into a gastroenterologist.  Patient states that is been years since she has had a colonoscopy or endoscopy.  Onset of symptoms chronic.  Nothing makes symptoms worse.        Past Medical History:  Diagnosis Date  . Blood transfusion without reported diagnosis   . Cancer [facility])    renal cancer  . Diabetes mellitus without complication ([place])   . Hypertension   . IBS (irritable bowel syndrome)     Patient Active Problem List   Diagnosis Date Noted  . Abdominal pain 09/03/2019  . Diabetes ([place]) 09/03/2019  . Benign essential HTN 09/03/2019  . Irritable bowel syndrome (IBS) 09/03/2019  . Left-sided weakness 09/01/2019    Past Surgical History:  Procedure Laterality Date  . ABDOMINAL HYSTERECTOMY    . ANKLE  SURGERY    . NEPHRECTOMY Right      OB History   No obstetric history on file.     History reviewed. No pertinent family history.  Social History   Tobacco Use  . Smoking status: Former Research scientist (life sciences)  . Smokeless tobacco: Never Used  Substance Use Topics  . Alcohol use: Not Currently  . Drug use: Never    Home Medications Prior to Admission medications   Medication Sig Start Date End Date Taking? Authorizing Provider  amLODipine (NORVASC) 10 MG tablet Take 10 mg by mouth daily. 07/17/19   [provider]  aspirin-acetaminophen-caffeine (EXCEDRIN MIGRAINE) [phone] MG tablet Take 1 tablet by mouth every 6 (six) hours as needed for headache.    [provider]  atorvastatin (LIPITOR) 80 MG tablet Take 80 mg by mouth daily. 07/17/19   [provider]  dexamethasone (DECADRON) 4 MG tablet Take 1 tablet (4 mg total) by mouth 2 (two) times daily. 06/18/19   Virgel Manifold, MD  gabapentin (NEURONTIN) 800 MG tablet Take 1 tablet (800 mg total) by mouth 2 (two) times daily. 09/04/19   Mariel Aloe, MD  HYDROcodone-acetaminophen (NORCO) 10-325 MG tablet Take 1 tablet by mouth every 6 (six) hours as needed (pain).  08/28/19   [provider]  hydrOXYzine (ATARAX/VISTARIL) 25 MG tablet Take 25 mg by mouth every 6 (six) hours as needed for itching. 07/30/19   [provider]  insulin glulisine (APIDRA) 100 UNIT/ML injection Inject 10 Units into the skin 3 (three) times daily before meals.    [provider]  LANTUS SOLOSTAR 100 UNIT/ML Solostar Pen Inject 60 Units into the skin at bedtime. 08/19/19   [provider]  lisinopril (ZESTRIL) 40 MG tablet Take 40 mg by mouth daily. 07/17/19   [provider]  morphine (MS CONTIN) 30 MG 12 hr tablet Take 30 mg by mouth every 12 (twelve) hours. 08/28/19   [provider]  QUEtiapine (SEROQUEL) 200 MG tablet Take 1-2 tablets by mouth See admin instructions. Take 1 tablet in the  morning and 2 tablets at night. 07/17/19   [provider]  tiZANidine (ZANAFLEX) 4 MG tablet Take 4 mg by mouth every 8 (eight) hours. 08/25/19   [provider]  traMADol (ULTRAM) 50 MG tablet Take 50 mg by mouth 3 (three) times daily as needed for pain. 08/25/19   [provider]  traZODone (DESYREL) 50 MG tablet Take 50 mg by mouth at bedtime as needed for sleep. 07/01/19   [provider]    Allergies    Penicillins, Ciprofloxacin, and Red dye  Review of Systems   Review of Systems  Constitutional: Negative for fever.  HENT: Negative for rhinorrhea and sore throat.   Eyes: Negative for redness.  Respiratory: Negative for cough.   Cardiovascular: Negative for chest pain.  Gastrointestinal: Positive for abdominal pain, diarrhea, nausea and vomiting.  Genitourinary: Negative for dysuria.  Musculoskeletal: Negative for myalgias.  Skin: Negative for rash.  Neurological: Negative for headaches.    Physical Exam Updated Vital Signs BP 124/79 (BP Location: Right Arm)   Pulse 87   Temp 98.3 F (36.8 C)   Resp 19   Ht 5' 5.5" (1.664 m)   Wt 93 kg   SpO2 99%   BMI 33.60 kg/m   Physical Exam Vitals and nursing note reviewed.  Constitutional:      Appearance: She is well-developed.  HENT:     Head: Normocephalic and atraumatic.  Eyes:     General:        Right eye: No discharge.        Left eye: No discharge.     Conjunctiva/sclera: Conjunctivae normal.  Cardiovascular:     Rate and Rhythm: Normal rate and regular rhythm.     Heart sounds: Normal heart sounds.  Pulmonary:     Effort: Pulmonary effort is normal.     Breath sounds: Normal breath sounds.  Abdominal:     Palpations: Abdomen is soft.     Tenderness: There is generalized abdominal tenderness (Mild, patient does not wince with deep palpation in any area). There is no guarding or rebound.  Musculoskeletal:     Cervical back: Normal range of motion and neck supple.  Skin:     General: Skin is warm and dry.  Neurological:     Mental Status: She is alert.     ED Results / Procedures / Treatments   Labs (all labs ordered are listed, but only abnormal results are displayed) Labs Reviewed  CBC WITH DIFFERENTIAL/PLATELET - Abnormal; Notable for the following components:      Result Value   WBC 12.0 (*)    Neutro Abs 7.9 (*)    All other components within normal limits  COMPREHENSIVE METABOLIC PANEL - Abnormal; Notable for the following components:   Glucose, Bld 177 (*)    BUN 26 (*)    Creatinine, Ser 2.10 (*)  Total Protein 8.4 (*)    GFR calc non Af Amer 25 (*)    GFR calc Af Amer 29 (*)    All other components within normal limits  URINALYSIS, ROUTINE W REFLEX MICROSCOPIC - Abnormal; Notable for the following components:   APPearance CLOUDY (*)    Specific Gravity, Urine >1.030 (*)    All other components within normal limits  LIPASE, BLOOD    EKG None  Radiology No results found.  Procedures Procedures (including critical care time)  Medications Ordered in ED Medications  sodium chloride 0.9 % bolus 1,000 mL (has no administration in time range)  sodium chloride 0.9 % bolus 1,000 mL (1,000 mLs Intravenous New Bag/Given 09/15/19 2246)  ketamine (KETALAR) injection 19 mg (19 mg Intravenous Given 09/15/19 2246)  ondansetron (ZOFRAN) injection 4 mg (4 mg Intravenous Given 09/15/19 2246)    ED Course  I have reviewed the triage vital signs and the nursing notes.  Pertinent labs & imaging results that were available during my care of the patient were reviewed by me and considered in my medical decision making (see chart for details).  Patient seen and examined.reviewed recent negative CT imaging and hospital admission.  Patient with extensive history.  Given chronic narcotic use, will give dose of ketamine which she has received in the past with improvement.  Will give Zofran and IV fluids.  Will make sure that her electrolytes and blood cell  counts look okay.  I attempted to set expectations with patient regarding goals of today's visit.  Will reassess.  Vital signs reviewed and are as follows: BP 124/79 (BP Location: Right Arm)   Pulse 87   Temp 98.3 F (36.8 C)   Resp 19   Ht 5' 5.5" (1.664 m)   Wt 93 kg   SpO2 99%   BMI 33.60 kg/m   12:16 AM patient rechecked.  Overall she states that she is feeling better.  She is receiving IV fluids at 500 cc/h through a small IV in her left forearm.  She has been trying to drink some fluids but it causes stomach cramping and she has not had a great p.o. intake at this point.  We will continue to hydrate.  I would like to hydrate the patient 2 L given her creatinine and concentrated urine.  We will continue p.o. challenge.  Patient will need reassessed.  If she feels better and has good p.o. intake, she can likely be discharged home, otherwise I would have a low threshold to admit patient given her acute kidney injury and one kidney.  If she has any further vomiting she would likely require admission.  Signout to Dr. Dayna Barker at shift change she will reevaluate the patient and dispo as appropriate.    MDM Rules/Calculators/A&P                      Patient with chronic abdominal pain here with nausea, vomiting and dehydration.  She has acute kidney injury due to dehydration most likely.  No focal abdominal tenderness.  She has had several evaluations for abdominal pain without results.  She has not yet been able to follow-up with a GI doctor.  Continuing IV hydration here and p.o. challenge.  Disposition will be based on improvement/progression of symptoms.   Final Clinical Impression(s) / ED Diagnoses Final diagnoses:  Chronic abdominal pain  Acute kidney injury ([place])  Dehydration    Rx / DC Orders ED Discharge Orders    None  Carlisle Cater, PA-C 09/16/19 0019    Merrily Pew, MD 09/16/19 [phone]

## 2019-09-15 NOTE — ED Notes (Signed)
Fluid challenge successful  

## 2019-09-15 NOTE — ED Triage Notes (Signed)
C/o abd pain x " months"

## 2019-09-16 MED ORDER — ONDANSETRON HCL 4 MG PO TABS: 4.0000 mg | ORAL_TABLET | Freq: Three times a day (TID) | ORAL | 0 refills | Status: DC | PRN

## 2019-09-16 MED ORDER — PROMETHAZINE HCL 25 MG RE SUPP: 25.0000 mg | Freq: Four times a day (QID) | RECTAL | 0 refills | Status: DC | PRN

## 2019-09-16 MED ORDER — PROMETHAZINE HCL 25 MG/ML IJ SOLN
12.5000 mg | Freq: Once | INTRAMUSCULAR | Status: AC
  Administered 2019-09-16: 12.5 mg via INTRAVENOUS
  Filled 2019-09-16: qty 1

## 2019-09-16 NOTE — ED Provider Notes (Signed)
Medical screening examination/treatment/procedure(s) were conducted as a shared visit with non-physician practitioner(s) and myself.  I personally evaluated the patient during the encounter.  Here with acute exacerbation of chronic abdominal pain with AKI. Plan for hydration, treatments for nausea, repeat PO challenge/etc. Likely discharge for pcp follow up if tolerating PO with out vomiting.   reeval with no vomiting, has had some sips of fluids without difficulty. Requesting some phenergan. Fluids still infusing.   Abdomen persistently benign. Observed for multiple hours without repeat emesis. Making urine, suspect kidney function mproved. Will continue to hydrate at home. Rx's provided for emesis, will fu w/ pcp in a few days for recheck of kidney function and symptoms.    Anahi Belmar, Corene Cornea, MD 09/16/19 [phone]

## 2019-09-18 ENCOUNTER — Encounter (HOSPITAL_BASED_OUTPATIENT_CLINIC_OR_DEPARTMENT_OTHER): Payer: Self-pay

## 2019-09-18 ENCOUNTER — Inpatient Hospital Stay (HOSPITAL_BASED_OUTPATIENT_CLINIC_OR_DEPARTMENT_OTHER)
Admission: EM | Admit: 2019-09-18 | Discharge: 2019-09-20 | DRG: 684 | Disposition: A | Discharge status: Home / Self Care | Payer: Medicare Other | Attending: Internal Medicine | Admitting: Internal Medicine

## 2019-09-18 ENCOUNTER — Other Ambulatory Visit: Payer: Self-pay

## 2019-09-18 ENCOUNTER — Emergency Department (HOSPITAL_BASED_OUTPATIENT_CLINIC_OR_DEPARTMENT_OTHER): Payer: Medicare Other

## 2019-09-18 DIAGNOSIS — Z7952 Long term (current) use of systemic steroids: Secondary | ICD-10-CM

## 2019-09-18 DIAGNOSIS — E1122 Type 2 diabetes mellitus with diabetic chronic kidney disease: Secondary | ICD-10-CM | POA: Diagnosis present

## 2019-09-18 DIAGNOSIS — M545 Low back pain: Secondary | ICD-10-CM | POA: Diagnosis present

## 2019-09-18 DIAGNOSIS — R109 Unspecified abdominal pain: Secondary | ICD-10-CM | POA: Diagnosis present

## 2019-09-18 DIAGNOSIS — N189 Chronic kidney disease, unspecified: Secondary | ICD-10-CM | POA: Diagnosis present

## 2019-09-18 DIAGNOSIS — R7401 Elevation of levels of liver transaminase levels: Secondary | ICD-10-CM | POA: Diagnosis present

## 2019-09-18 DIAGNOSIS — I10 Essential (primary) hypertension: Secondary | ICD-10-CM | POA: Diagnosis present

## 2019-09-18 DIAGNOSIS — G894 Chronic pain syndrome: Secondary | ICD-10-CM | POA: Diagnosis present

## 2019-09-18 DIAGNOSIS — N281 Cyst of kidney, acquired: Secondary | ICD-10-CM | POA: Diagnosis present

## 2019-09-18 DIAGNOSIS — E119 Type 2 diabetes mellitus without complications: Secondary | ICD-10-CM

## 2019-09-18 DIAGNOSIS — Z880 Allergy status to penicillin: Secondary | ICD-10-CM

## 2019-09-18 DIAGNOSIS — E860 Dehydration: Secondary | ICD-10-CM | POA: Diagnosis present

## 2019-09-18 DIAGNOSIS — I129 Hypertensive chronic kidney disease with stage 1 through stage 4 chronic kidney disease, or unspecified chronic kidney disease: Secondary | ICD-10-CM | POA: Diagnosis present

## 2019-09-18 DIAGNOSIS — Z794 Long term (current) use of insulin: Secondary | ICD-10-CM

## 2019-09-18 DIAGNOSIS — Z20822 Contact with and (suspected) exposure to covid-19: Secondary | ICD-10-CM | POA: Diagnosis present

## 2019-09-18 DIAGNOSIS — Z87891 Personal history of nicotine dependence: Secondary | ICD-10-CM

## 2019-09-18 DIAGNOSIS — N179 Acute kidney failure, unspecified: Principal | ICD-10-CM | POA: Diagnosis present

## 2019-09-18 DIAGNOSIS — K589 Irritable bowel syndrome without diarrhea: Secondary | ICD-10-CM | POA: Diagnosis present

## 2019-09-18 DIAGNOSIS — N1831 Chronic kidney disease, stage 3a: Secondary | ICD-10-CM | POA: Diagnosis present

## 2019-09-18 DIAGNOSIS — Z905 Acquired absence of kidney: Secondary | ICD-10-CM

## 2019-09-18 DIAGNOSIS — Z85528 Personal history of other malignant neoplasm of kidney: Secondary | ICD-10-CM

## 2019-09-18 DIAGNOSIS — Z79899 Other long term (current) drug therapy: Secondary | ICD-10-CM

## 2019-09-18 LAB — CBC WITH DIFFERENTIAL/PLATELET
Abs Immature Granulocytes: 0.02 10*3/uL (ref 0.00–0.07)
Basophils Absolute: 0 10*3/uL (ref 0.0–0.1)
Basophils Relative: 0 %
Eosinophils Absolute: 0.4 10*3/uL (ref 0.0–0.5)
Eosinophils Relative: 4 %
HCT: 29.8 % — ABNORMAL LOW (ref 36.0–46.0)
Hemoglobin: 9.9 g/dL — ABNORMAL LOW (ref 12.0–15.0)
Immature Granulocytes: 0 %
Lymphocytes Relative: 18 %
Lymphs Abs: 1.6 10*3/uL (ref 0.7–4.0)
MCH: 32.2 pg (ref 26.0–34.0)
MCHC: 33.2 g/dL (ref 30.0–36.0)
MCV: 97.1 fL (ref 80.0–100.0)
Monocytes Absolute: 0.6 10*3/uL (ref 0.1–1.0)
Monocytes Relative: 7 %
Neutro Abs: 6.5 10*3/uL (ref 1.7–7.7)
Neutrophils Relative %: 71 %
Platelets: 321 10*3/uL (ref 150–400)
RBC: 3.07 MIL/uL — ABNORMAL LOW (ref 3.87–5.11)
RDW: 12.8 % (ref 11.5–15.5)
WBC: 9.1 10*3/uL (ref 4.0–10.5)
nRBC: 0 % (ref 0.0–0.2)

## 2019-09-18 LAB — COMPREHENSIVE METABOLIC PANEL
ALT: 62 U/L — ABNORMAL HIGH (ref 0–44)
AST: 208 U/L — ABNORMAL HIGH (ref 15–41)
Albumin: 3.8 g/dL (ref 3.5–5.0)
Alkaline Phosphatase: 82 U/L (ref 38–126)
Anion gap: 11 (ref 5–15)
BUN: 34 mg/dL — ABNORMAL HIGH (ref 6–20)
CO2: 21 mmol/L — ABNORMAL LOW (ref 22–32)
Calcium: 8.7 mg/dL — ABNORMAL LOW (ref 8.9–10.3)
Chloride: 102 mmol/L (ref 98–111)
Creatinine, Ser: 3.24 mg/dL — ABNORMAL HIGH (ref 0.44–1.00)
GFR calc Af Amer: 17 mL/min — ABNORMAL LOW (ref >60)
GFR calc non Af Amer: 15 mL/min — ABNORMAL LOW (ref >60)
Glucose, Bld: 147 mg/dL — ABNORMAL HIGH (ref 70–99)
Potassium: 3.9 mmol/L (ref 3.5–5.1)
Sodium: 134 mmol/L — ABNORMAL LOW (ref 135–145)
Total Bilirubin: 0.6 mg/dL (ref 0.3–1.2)
Total Protein: 7.1 g/dL (ref 6.5–8.1)

## 2019-09-18 LAB — SARS CORONAVIRUS 2 (TAT 6-24 HRS): SARS Coronavirus 2: NEGATIVE

## 2019-09-18 MED ORDER — PROMETHAZINE HCL 25 MG/ML IJ SOLN
12.5000 mg | Freq: Once | INTRAMUSCULAR | Status: AC
  Administered 2019-09-18: 12.5 mg via INTRAVENOUS
  Filled 2019-09-18: qty 1

## 2019-09-18 MED ORDER — LACTATED RINGERS IV BOLUS
1000.0000 mL | Freq: Once | INTRAVENOUS | Status: AC
  Administered 2019-09-18: 1000 mL via INTRAVENOUS

## 2019-09-18 MED ORDER — MORPHINE SULFATE (PF) 4 MG/ML IV SOLN
4.0000 mg | Freq: Once | INTRAVENOUS | Status: AC
  Administered 2019-09-18: 4 mg via INTRAVENOUS
  Filled 2019-09-18: qty 1

## 2019-09-18 NOTE — ED Provider Notes (Signed)
[facility] EMERGENCY DEPARTMENT Provider Note   CSN: [id number] Arrival date & time: 09/18/19  1610     History Chief Complaint  Patient presents with  . Dehydration    Amy Nunez is a 58 y.o. female.  HPI    59 year old female with left-sided abdominal and left lower back pain.  She reports that she has been having ongoing pain for the past several months.  Multiple recent ER evaluations of the same.  The pain is been worse in the past week.  She has not noticed any appreciable exacerbating relieving factors.  Nausea and vomiting.  No diarrhea.  Decreased urinary output.  No dysuria.  Feels generally fatigued. Past Medical History:  Diagnosis Date  . Blood transfusion without reported diagnosis   . Cancer [facility])    renal cancer  . Diabetes mellitus without complication ([place])   . Hypertension   . IBS (irritable bowel syndrome)     Patient Active Problem List   Diagnosis Date Noted  . Abdominal pain 09/03/2019  . Diabetes ([place]) 09/03/2019  . Benign essential HTN 09/03/2019  . Irritable bowel syndrome (IBS) 09/03/2019  . Left-sided weakness 09/01/2019    Past Surgical History:  Procedure Laterality Date  . ABDOMINAL HYSTERECTOMY    . ANKLE SURGERY    . NEPHRECTOMY Right      OB History   No obstetric history on file.     No family history on file.  Social History   Tobacco Use  . Smoking status: Former Research scientist (life sciences)  . Smokeless tobacco: Never Used  Substance Use Topics  . Alcohol use: Not Currently  . Drug use: Never    Home Medications Prior to Admission medications   Medication Sig Start Date End Date Taking? Authorizing Provider  amLODipine (NORVASC) 10 MG tablet Take 10 mg by mouth daily. 07/17/19   [provider]  aspirin-acetaminophen-caffeine (EXCEDRIN MIGRAINE) [phone] MG tablet Take 1 tablet by mouth every 6 (six) hours as needed for headache.    [provider]  atorvastatin (LIPITOR) 80 MG tablet Take 80 mg by  mouth daily. 07/17/19   [provider]  dexamethasone (DECADRON) 4 MG tablet Take 1 tablet (4 mg total) by mouth 2 (two) times daily. 06/18/19   Virgel Manifold, MD  gabapentin (NEURONTIN) 800 MG tablet Take 1 tablet (800 mg total) by mouth 2 (two) times daily. 09/04/19   Mariel Aloe, MD  HYDROcodone-acetaminophen (NORCO) 10-325 MG tablet Take 1 tablet by mouth every 6 (six) hours as needed (pain).  08/28/19   [provider]  hydrOXYzine (ATARAX/VISTARIL) 25 MG tablet Take 25 mg by mouth every 6 (six) hours as needed for itching. 07/30/19   [provider]  insulin glulisine (APIDRA) 100 UNIT/ML injection Inject 10 Units into the skin 3 (three) times daily before meals.    [provider]  LANTUS SOLOSTAR 100 UNIT/ML Solostar Pen Inject 60 Units into the skin at bedtime. 08/19/19   [provider]  lisinopril (ZESTRIL) 40 MG tablet Take 40 mg by mouth daily. 07/17/19   [provider]  morphine (MS CONTIN) 30 MG 12 hr tablet Take 30 mg by mouth every 12 (twelve) hours. 08/28/19   [provider]  ondansetron (ZOFRAN) 4 MG tablet Take 1 tablet (4 mg total) by mouth every 8 (eight) hours as needed for nausea or vomiting. 09/16/19   Mesner, Corene Cornea, MD  promethazine (PHENERGAN) 25 MG suppository Place 1 suppository (25 mg total) rectally every 6 (six)  hours as needed for nausea or vomiting. If oral medication not staying down. 09/16/19   Mesner, Corene Cornea, MD  QUEtiapine (SEROQUEL) 200 MG tablet Take 1-2 tablets by mouth See admin instructions. Take 1 tablet in the morning and 2 tablets at night. 07/17/19   [provider]  tiZANidine (ZANAFLEX) 4 MG tablet Take 4 mg by mouth every 8 (eight) hours. 08/25/19   [provider]  traMADol (ULTRAM) 50 MG tablet Take 50 mg by mouth 3 (three) times daily as needed for pain. 08/25/19   [provider]  traZODone (DESYREL) 50 MG tablet Take 50 mg by mouth at bedtime as needed for  sleep. 07/01/19   [provider]    Allergies    Penicillins, Ciprofloxacin, and Red dye  Review of Systems   Review of Systems All systems reviewed and negative, other than as noted in HPI.  Physical Exam Updated Vital Signs BP 97/67 (BP Location: Left Arm)   Pulse 98   Temp 98.5 F (36.9 C) (Oral)   Resp 16   Ht 5' 5.5" (1.664 m)   Wt 90.7 kg   SpO2 97%   BMI 32.78 kg/m   Physical Exam Vitals and nursing note reviewed.  Constitutional:      General: She is not in acute distress.    Appearance: She is well-developed.  HENT:     Head: Normocephalic and atraumatic.  Eyes:     General:        Right eye: No discharge.        Left eye: No discharge.     Conjunctiva/sclera: Conjunctivae normal.  Cardiovascular:     Rate and Rhythm: Normal rate and regular rhythm.     Heart sounds: Normal heart sounds. No murmur. No friction rub. No gallop.   Pulmonary:     Effort: Pulmonary effort is normal. No respiratory distress.     Breath sounds: Normal breath sounds.  Abdominal:     General: There is no distension.     Palpations: Abdomen is soft.     Tenderness: There is no abdominal tenderness.  Musculoskeletal:        General: No tenderness.     Cervical back: Neck supple.  Skin:    General: Skin is warm and dry.  Neurological:     Mental Status: She is alert.  Psychiatric:        Behavior: Behavior normal.        Thought Content: Thought content normal.     ED Results / Procedures / Treatments   Labs (all labs ordered are listed, but only abnormal results are displayed) Labs Reviewed  URINE CULTURE - Abnormal; Notable for the following components:      Result Value   Culture   (*)    Value: <10,000 COLONIES/mL INSIGNIFICANT GROWTH Performed at [facility], [address] [place], [place] [address]    All other components within normal limits  CBC WITH DIFFERENTIAL/PLATELET - Abnormal; Notable for the following components:   RBC 3.07 (*)     Hemoglobin 9.9 (*)    HCT 29.8 (*)    All other components within normal limits  COMPREHENSIVE METABOLIC PANEL - Abnormal; Notable for the following components:   Sodium 134 (*)    CO2 21 (*)    Glucose, Bld 147 (*)    BUN 34 (*)    Creatinine, Ser 3.24 (*)    Calcium 8.7 (*)    AST 208 (*)  ALT 62 (*)    GFR calc non Af Amer 15 (*)    GFR calc Af Amer 17 (*)    All other components within normal limits  BASIC METABOLIC PANEL - Abnormal; Notable for the following components:   CO2 21 (*)    Glucose, Bld 172 (*)    BUN 30 (*)    Creatinine, Ser 2.22 (*)    Calcium 8.7 (*)    GFR calc non Af Amer 23 (*)    GFR calc Af Amer 27 (*)    All other components within normal limits  CBC - Abnormal; Notable for the following components:   RBC 3.25 (*)    Hemoglobin 10.5 (*)    HCT 31.9 (*)    All other components within normal limits  PROTEIN / CREATININE RATIO, URINE - Abnormal; Notable for the following components:   Protein Creatinine Ratio 0.21 (*)    All other components within normal limits  GLUCOSE, CAPILLARY - Abnormal; Notable for the following components:   Glucose-Capillary 141 (*)    All other components within normal limits  URINALYSIS, ROUTINE W REFLEX MICROSCOPIC - Abnormal; Notable for the following components:   Hgb urine dipstick MODERATE (*)    All other components within normal limits  GLUCOSE, CAPILLARY - Abnormal; Notable for the following components:   Glucose-Capillary 167 (*)    All other components within normal limits  GLUCOSE, CAPILLARY - Abnormal; Notable for the following components:   Glucose-Capillary 156 (*)    All other components within normal limits  COMPREHENSIVE METABOLIC PANEL - Abnormal; Notable for the following components:   CO2 21 (*)    Glucose, Bld 127 (*)    BUN 24 (*)    Creatinine, Ser 1.42 (*)    Calcium 8.5 (*)    Total Protein 6.4 (*)    Albumin 3.3 (*)    AST 143 (*)    ALT 64 (*)    GFR calc non Af Amer 40 (*)    GFR  calc Af Amer 46 (*)    All other components within normal limits  CBC WITH DIFFERENTIAL/PLATELET - Abnormal; Notable for the following components:   RBC 3.18 (*)    Hemoglobin 10.1 (*)    HCT 30.8 (*)    All other components within normal limits  GLUCOSE, CAPILLARY - Abnormal; Notable for the following components:   Glucose-Capillary 123 (*)    All other components within normal limits  SARS CORONAVIRUS 2 (TAT 6-24 HRS)  MAGNESIUM  PHOSPHORUS  SODIUM, URINE, RANDOM  GLUCOSE, CAPILLARY  GLUCOSE, CAPILLARY  PROCALCITONIN  LACTIC ACID, PLASMA  GLUCOSE, CAPILLARY    EKG None  Radiology No results found.   CT ABDOMEN PELVIS WO CONTRAST  Result Date: 09/18/2019 CLINICAL DATA:  Left lower quadrant pain. EXAM: CT ABDOMEN AND PELVIS WITHOUT CONTRAST TECHNIQUE: Multidetector CT imaging of the abdomen and pelvis was performed following the standard protocol without IV contrast. COMPARISON:  September 01, 2019 FINDINGS: Lower chest: There is atelectasis versus scarring at the lung bases bilaterally.The heart size is normal. Hepatobiliary: The liver is normal. Normal gallbladder.There is no biliary ductal dilation. Pancreas: Normal contours without ductal dilatation. No peripancreatic fluid collection. Spleen: No splenic laceration or hematoma. Adrenals/Urinary Tract: --Adrenal glands: No adrenal hemorrhage. --Right kidney/ureter: The patient is status post right-sided nephrectomy. There is no obvious soft tissue density within the right nephrectomy bed, however evaluation is limited by lack of IV contrast. --Left kidney/ureter: There is no left-sided hydronephrosis.  A cyst is again noted in the lower pole. There is questionable mild they increase in perinephric stranding about the left kidney. There are no radiopaque stones. There is suggestion of mild interval increase in size of the left kidney is well. --Urinary bladder: Unremarkable. Stomach/Bowel: --Stomach/Duodenum: No hiatal hernia or other  gastric abnormality. Normal duodenal course and caliber. --Small bowel: No dilatation or inflammation. --Colon: No focal abnormality. --Appendix: Normal. Vascular/Lymphatic: Atherosclerotic calcification is present within the non-aneurysmal abdominal aorta, without hemodynamically significant stenosis. --No retroperitoneal lymphadenopathy. --No mesenteric lymphadenopathy. --No pelvic or inguinal lymphadenopathy. Reproductive: Unremarkable Other: No ascites or free air. The abdominal wall is normal. Musculoskeletal. No acute displaced fractures. IMPRESSION: 1. No definite acute intra-abdominal abnormality detected. 2. Subtle increase in left perinephric stranding and possible slight interval increase in size of the left kidney. Correlation with urinalysis is recommended to help exclude a process such as pyelonephritis. There is no hydronephrosis. There are no radiopaque obstructing kidney stones. 3. Status post right-sided nephrectomy. 4.  Aortic Atherosclerosis (ICD10-I70.0). Electronically Signed   By: Constance Holster M.D.   On: 09/18/2019 19:26   CT ABDOMEN PELVIS WO CONTRAST  Result Date: 09/01/2019 CLINICAL DATA:  Abdominal pain with nausea, vomiting, and diarrhea. Fever. EXAM: CT ABDOMEN AND PELVIS WITHOUT CONTRAST TECHNIQUE: Multidetector CT imaging of the abdomen and pelvis was performed following the standard protocol without IV contrast. COMPARISON:  None. FINDINGS: Lower chest: Minimal linear scarring or atelectasis at the lung bases. Heart size is normal. Aortic atherosclerosis. Hepatobiliary: No focal liver abnormality is seen. No gallstones, gallbladder wall thickening, or biliary dilatation. Pancreas: Unremarkable. No pancreatic ductal dilatation or surrounding inflammatory changes. Spleen: Normal in size without focal abnormality. Adrenals/Urinary Tract: Right nephrectomy. Adrenal glands are normal. 3.7 cm cyst on the lower pole of the hypertrophied left kidney. No hydronephrosis. The bladder is  empty. Stomach/Bowel: Stomach is within normal limits. Appendix appears normal. No evidence of bowel wall thickening, distention, or inflammatory changes. There are few diverticula in the ascending colon. The appendix is prominent but there are no inflammatory changes and there is no appendicolith. Vascular/Lymphatic: Aortic atherosclerosis. No enlarged abdominal or pelvic lymph nodes. Reproductive: Status post hysterectomy. No adnexal masses. Other: No abdominal wall hernia or abnormality. No abdominopelvic ascites. Musculoskeletal: No acute or significant osseous findings. IMPRESSION: Benign-appearing abdomen and pelvis. Electronically Signed   By: Lorriane Shire M.D.   On: 09/01/2019 17:50   CT Head Wo Contrast  Result Date: 09/01/2019 CLINICAL DATA:  Headache, fever, and left-sided weakness. EXAM: CT HEAD WITHOUT CONTRAST CT CERVICAL SPINE WITHOUT CONTRAST TECHNIQUE: Multidetector CT imaging of the head and cervical spine was performed following the standard protocol without intravenous contrast. Multiplanar CT image reconstructions of the cervical spine were also generated. COMPARISON:  None. FINDINGS: CT HEAD FINDINGS Brain: No evidence of acute infarction, hemorrhage, hydrocephalus, extra-axial collection or mass lesion/mass effect. Vascular: No hyperdense vessel or unexpected calcification. Skull: Normal. Negative for fracture or focal lesion. Sinuses/Orbits: Normal. Other: None CT CERVICAL SPINE FINDINGS Alignment: Straightening of the cervical lordosis. 2 mm anterolisthesis of C3 on C4. Skull base and vertebrae: No acute fracture. No primary bone lesion or focal pathologic process. Soft tissues and spinal canal: No prevertebral fluid or swelling. No visible canal hematoma. Disc levels:  C2-3: Negative. C3-4: Disc space narrowing. Minimal anterolisthesis. Small uncinate spurs extend into the left and right neural foraminal with slight left foraminal narrowing. No soft disc protrusions. C4-5: Disc space  narrowing. Small broad-based disc osteophyte complex narrowing both lateral recesses.  Moderate bilateral foraminal narrowing. C5-6: Disc space narrowing. Broad-based disc osteophyte complex with a central disc protrusion which narrows the AP diameter of the spinal canal. Moderate bilateral foraminal narrowing. C6-7: Disc space narrowing. Slight bilateral foraminal narrowing, right greater than left. C7-T1: Normal. Upper chest: Negative. Other: None IMPRESSION: 1. Normal CT scan of the head. 2. No acute abnormality of the cervical spine. Multilevel degenerative disc and joint disease. Electronically Signed   By: Lorriane Shire M.D.   On: 09/01/2019 17:43   CT Cervical Spine Wo Contrast  Result Date: 09/01/2019 CLINICAL DATA:  Headache, fever, and left-sided weakness. EXAM: CT HEAD WITHOUT CONTRAST CT CERVICAL SPINE WITHOUT CONTRAST TECHNIQUE: Multidetector CT imaging of the head and cervical spine was performed following the standard protocol without intravenous contrast. Multiplanar CT image reconstructions of the cervical spine were also generated. COMPARISON:  None. FINDINGS: CT HEAD FINDINGS Brain: No evidence of acute infarction, hemorrhage, hydrocephalus, extra-axial collection or mass lesion/mass effect. Vascular: No hyperdense vessel or unexpected calcification. Skull: Normal. Negative for fracture or focal lesion. Sinuses/Orbits: Normal. Other: None CT CERVICAL SPINE FINDINGS Alignment: Straightening of the cervical lordosis. 2 mm anterolisthesis of C3 on C4. Skull base and vertebrae: No acute fracture. No primary bone lesion or focal pathologic process. Soft tissues and spinal canal: No prevertebral fluid or swelling. No visible canal hematoma. Disc levels:  C2-3: Negative. C3-4: Disc space narrowing. Minimal anterolisthesis. Small uncinate spurs extend into the left and right neural foraminal with slight left foraminal narrowing. No soft disc protrusions. C4-5: Disc space narrowing. Small broad-based  disc osteophyte complex narrowing both lateral recesses. Moderate bilateral foraminal narrowing. C5-6: Disc space narrowing. Broad-based disc osteophyte complex with a central disc protrusion which narrows the AP diameter of the spinal canal. Moderate bilateral foraminal narrowing. C6-7: Disc space narrowing. Slight bilateral foraminal narrowing, right greater than left. C7-T1: Normal. Upper chest: Negative. Other: None IMPRESSION: 1. Normal CT scan of the head. 2. No acute abnormality of the cervical spine. Multilevel degenerative disc and joint disease. Electronically Signed   By: Lorriane Shire M.D.   On: 09/01/2019 17:43   MR BRAIN WO CONTRAST  Result Date: 09/03/2019 CLINICAL DATA:  Left-sided weakness EXAM: MRI HEAD WITHOUT CONTRAST TECHNIQUE: Multiplanar, multiecho pulse sequences of the brain and surrounding structures were obtained without intravenous contrast. COMPARISON:  None. FINDINGS: Brain: There is no acute infarction or intracranial hemorrhage. There is no intracranial mass, mass effect, or edema. There is no hydrocephalus or extra-axial fluid collection. Minimal patchy T2 hyperintensity in the supratentorial white matter is nonspecific but may reflect minor chronic microvascular ischemic changes. Vascular: Major vessel flow voids at the skull base are preserved. Skull and upper cervical spine: Normal marrow signal is preserved. Sinuses/Orbits: Paranasal sinuses are aerated. Orbits are unremarkable. Other: Sella is unremarkable.  Mastoid air cells are clear. IMPRESSION: No evidence of recent infarction, intracranial hemorrhage, or mass. Electronically Signed   By: Macy Mis M.D.   On: 09/03/2019 11:20   US RENAL  Result Date: 09/19/2019 CLINICAL DATA:  Left flank pain EXAM: RENAL / URINARY TRACT ULTRASOUND COMPLETE COMPARISON:  CT 09/17/2018 FINDINGS: Right Kidney: Status post nephrectomy Left Kidney: Renal measurements: 13.5 x 6.9 x 6.2 cm = volume: 3 L1 mL. Cortical echogenicity  normal. No hydronephrosis. Cyst at the lower pole measuring 3.3 x 3.1 x 3.6 cm. Bladder: Appears normal for degree of bladder distention. Other: None. IMPRESSION: 1. Negative for left hydronephrosis. Cyst in the lower pole left kidney. 2. Status post right nephrectomy  Electronically Signed   By: Donavan Foil M.D.   On: 09/19/2019 17:19   DG Chest Portable 1 View  Result Date: 09/08/2019 CLINICAL DATA:  59 year old female with intermittent chest pressure for 2 weeks. EXAM: PORTABLE CHEST 1 VIEW COMPARISON:  Portable chest 09/01/2019. FINDINGS: Portable AP upright view at 2326 hours. Lung volumes and mediastinal contours remain within normal limits. Visualized tracheal air column is within normal limits. Allowing for portable technique the lungs are clear. No pneumothorax. No acute osseous abnormality identified. IMPRESSION: Stable and negative portable chest. Electronically Signed   By: Genevie Ann M.D.   On: 09/08/2019 23:38   DG Chest Portable 1 View  Result Date: 09/01/2019 CLINICAL DATA:  Chest pressure. Nausea, vomiting, diarrhea. Fever and weakness. EXAM: PORTABLE CHEST 1 VIEW COMPARISON:  None. FINDINGS: The heart size and mediastinal contours are within normal limits. Both lungs are clear. The visualized skeletal structures are unremarkable. IMPRESSION: Normal exam. Electronically Signed   By: Lorriane Shire M.D.   On: 09/01/2019 17:50   ECHOCARDIOGRAM COMPLETE  Result Date: 09/03/2019   ECHOCARDIOGRAM REPORT   Patient Name:   Amy Nunez Date of Exam: 09/03/2019 Medical Rec #:  [id number]        Height:       65.5 in Accession #:    [id number]       Weight:       200.0 lb Date of Birth:  19-Aug-1961        BSA:          1.99 m Patient Age:    87 years         BP:           132/74 mmHg Patient Gender: F                HR:           77 bpm. Exam Location:  Inpatient Procedure: 2D Echo Indications:    TIA 435.9 / G45.9  History:        Patient has prior history of Echocardiogram examinations.                  Arrythmias:PAC; Risk Factors:Hypertension and Diabetes.  Sonographer:    Darlina Sicilian RDCS Referring Phys: [phone] [facility] GAR[facility]  Sonographer Comments: Unalbe to turn on left side due to hip pain IMPRESSIONS  1. Left ventricular ejection fraction, by visual estimation, is 60 to 65%. The left ventricle has normal function. There is mildly increased left ventricular hypertrophy.  2. Global right ventricle has normal systolic function.The right ventricular size is normal. No increase in right ventricular wall thickness.  3. Left atrial size was normal.  4. Right atrial size was normal.  5. The mitral valve is normal in structure. No evidence of mitral valve regurgitation.  6. The tricuspid valve is normal in structure.  7. The aortic valve is tricuspid. Aortic valve regurgitation is not visualized.  8. The pulmonic valve was not well visualized. Pulmonic valve regurgitation is not visualized.  9. The inferior vena cava is dilated in size with >50% respiratory variability, suggesting right atrial pressure of 8 mmHg. FINDINGS  Left Ventricle: Left ventricular ejection fraction, by visual estimation, is 60 to 65%. The left ventricle has normal function. The left ventricle has no regional wall motion abnormalities. The left ventricular internal cavity size was the left ventricle is normal in size. There is mildly increased left ventricular hypertrophy. Left ventricular diastolic parameters were normal. Right Ventricle: The  right ventricular size is normal. No increase in right ventricular wall thickness. Global RV systolic function is has normal systolic function. Left Atrium: Left atrial size was normal in size. Right Atrium: Right atrial size was normal in size Pericardium: Trivial pericardial effusion is present. Mitral Valve: The mitral valve is normal in structure. No evidence of mitral valve regurgitation. Tricuspid Valve: The tricuspid valve is normal in structure. Tricuspid valve regurgitation is not  demonstrated. Aortic Valve: The aortic valve is tricuspid. Aortic valve regurgitation is not visualized. Pulmonic Valve: The pulmonic valve was not well visualized. Pulmonic valve regurgitation is not visualized. Pulmonic regurgitation is not visualized. Aorta: The aortic root and ascending aorta are structurally normal, with no evidence of dilitation. Venous: The inferior vena cava is dilated in size with greater than 50% respiratory variability, suggesting right atrial pressure of 8 mmHg. IAS/Shunts: The interatrial septum was not well visualized.  LEFT VENTRICLE PLAX 2D LVIDd:         4.20 cm  Diastology LVIDs:         2.70 cm  LV e' lateral:   6.53 cm/s LV PW:         1.00 cm  LV E/e' lateral: 11.0 LV IVS:        1.40 cm  LV e' medial:    6.85 cm/s LVOT diam:     2.10 cm  LV E/e' medial:  10.5 LV SV:         52 ml LV SV Index:   24.78 LVOT Area:     3.46 cm  RIGHT VENTRICLE RV S prime:     26.10 cm/s LEFT ATRIUM             Index LA diam:        3.30 cm 1.66 cm/m LA Vol (A2C):   39.4 ml 19.80 ml/m LA Vol (A4C):   53.8 ml 27.04 ml/m LA Biplane Vol: 50.0 ml 25.13 ml/m  AORTIC VALVE LVOT Vmax:   70.90 cm/s LVOT Vmean:  45.900 cm/s LVOT VTI:    0.147 m  AORTA Ao Root diam: 3.00 cm Ao Asc diam:  3.40 cm MITRAL VALVE MV Area (PHT): 3.77 cm             SHUNTS MV PHT:        58.29 msec           Systemic VTI:  0.15 m MV Decel Time: 201 msec             Systemic Diam: 2.10 cm MV E velocity: 71.60 cm/s 103 cm/s MV A velocity: 75.00 cm/s 70.3 cm/s MV E/A ratio:  0.95       1.5  Oswaldo Milian MD Electronically signed by Oswaldo Milian MD Signature Date/Time: 09/03/2019/6:10:37 PM    Final    VAS US CAROTID (at [facility] and [facility] only)  Result Date: 09/03/2019 Carotid Arterial Duplex Study Indications:       TIA. Risk Factors:      Hypertension, hyperlipidemia, Diabetes. Limitations        Today's exam was limited due to the body habitus of the                    patient. Comparison Study:  No prior study.  Performing Technologist: Maudry Mayhew MHA, RDMS, RVT, RDCS  Examination Guidelines: A complete evaluation includes B-mode imaging, spectral Doppler, color Doppler, and power Doppler as needed of all accessible portions of each vessel. Bilateral testing is considered an integral  part of a complete examination. Limited examinations for reoccurring indications may be performed as noted.  Right Carotid Findings: +----------+--------+--------+--------+------------------+------------------+           PSV cm/sEDV cm/sStenosisPlaque DescriptionComments           +----------+--------+--------+--------+------------------+------------------+ CCA Prox  88      20                                intimal thickening +----------+--------+--------+--------+------------------+------------------+ CCA Distal86      25                                intimal thickening +----------+--------+--------+--------+------------------+------------------+ ICA Prox  101     22                                                   +----------+--------+--------+--------+------------------+------------------+ ICA Distal64      23                                                   +----------+--------+--------+--------+------------------+------------------+ ECA       77      17                                                   +----------+--------+--------+--------+------------------+------------------+ +----------+--------+-------+----------------+-------------------+           PSV cm/sEDV cmsDescribe        Arm Pressure (mmHG) +----------+--------+-------+----------------+-------------------+ Subclavian150            Multiphasic, WNL                    +----------+--------+-------+----------------+-------------------+ +---------+--------+--+--------+--+---------+ VertebralPSV cm/s58EDV cm/s12Antegrade +---------+--------+--+--------+--+---------+  Left Carotid Findings:  +----------+--------+-------+--------+--------------------------------+--------+           PSV cm/sEDV    StenosisPlaque Description              Comments                   cm/s                                                    +----------+--------+-------+--------+--------------------------------+--------+ CCA Prox  108     17             smooth, heterogenous and                                                  calcific                                 +----------+--------+-------+--------+--------------------------------+--------+ CCA Distal91  27                                                      +----------+--------+-------+--------+--------------------------------+--------+ ICA Prox  75      24             smooth and heterogenous                  +----------+--------+-------+--------+--------------------------------+--------+ ICA Distal67      22                                                      +----------+--------+-------+--------+--------------------------------+--------+ ECA       95      25                                                      +----------+--------+-------+--------+--------------------------------+--------+ +----------+--------+--------+----------------+-------------------+           PSV cm/sEDV cm/sDescribe        Arm Pressure (mmHG) +----------+--------+--------+----------------+-------------------+ [id number]             Multiphasic, WNL                    +----------+--------+--------+----------------+-------------------+ +---------+--------+--+--------+--+---------+ VertebralPSV cm/s62EDV cm/s14Antegrade +---------+--------+--+--------+--+---------+  Summary: Right Carotid: Velocities in the right ICA are consistent with a 1-39% stenosis. Left Carotid: Velocities in the left ICA are consistent with a 1-39% stenosis. Vertebrals:  Bilateral vertebral arteries demonstrate antegrade flow. Subclavians:  Normal flow hemodynamics were seen in bilateral subclavian              arteries. *See table(s) above for measurements and observations.  Electronically signed by Antony Contras MD on 09/03/2019 at 12:14:03 PM.    Final     Procedures Procedures (including critical care time)  Medications Ordered in ED Medications  lactated ringers bolus 1,000 mL (0 mLs Intravenous Stopped 09/18/19 1840)  promethazine (PHENERGAN) injection 12.5 mg (12.5 mg Intravenous Given 09/18/19 1717)  morphine 4 MG/ML injection 4 mg (4 mg Intravenous Given 09/18/19 1715)    ED Course  I have reviewed the triage vital signs and the nursing notes.  Pertinent labs & imaging results that were available during my care of the patient were reviewed by me and considered in my medical decision making (see chart for details).    MDM Rules/Calculators/A&P                      52-year-old female with left flank pain.  Worsening renal function.  She has 1 kidney.  We will admit for ongoing hydration.  Imaging noted.  Possible UTI.  Urinalysis still pending.  For ongoing management.   Final Clinical Impression(s) / ED Diagnoses Final diagnoses:  Dehydration  AKI (acute kidney injury) [facility])    Rx / DC Orders ED Discharge Orders    None       Virgel Manifold, MD 09/24/19 [phone]

## 2019-09-18 NOTE — ED Triage Notes (Signed)
Pt has been dealing with chronic abd pain and was evaluated 2 days ago for same. Pt was treated for dehydration. Today pt is concerned because she has decreased urine output and fatigue.

## 2019-09-18 NOTE — Progress Notes (Signed)
Patient is a 59 year old female with past medical history significant for right nephrectomy, hypertension, diabetes mellitus, recurrent AKI on CKD 3A at baseline.  Patient presents with poor p.o. intake and worsening renal function.  Systolic blood pressure is also on the low normal side.  Patient will be transferred to [facility] for further assessment and management.

## 2019-09-19 ENCOUNTER — Inpatient Hospital Stay (HOSPITAL_COMMUNITY): Payer: Medicare Other

## 2019-09-19 DIAGNOSIS — K589 Irritable bowel syndrome without diarrhea: Secondary | ICD-10-CM | POA: Diagnosis not present

## 2019-09-19 DIAGNOSIS — Z85528 Personal history of other malignant neoplasm of kidney: Secondary | ICD-10-CM | POA: Diagnosis not present

## 2019-09-19 DIAGNOSIS — Z20822 Contact with and (suspected) exposure to covid-19: Secondary | ICD-10-CM | POA: Diagnosis not present

## 2019-09-19 DIAGNOSIS — Z7952 Long term (current) use of systemic steroids: Secondary | ICD-10-CM | POA: Diagnosis not present

## 2019-09-19 DIAGNOSIS — Z794 Long term (current) use of insulin: Secondary | ICD-10-CM | POA: Diagnosis not present

## 2019-09-19 DIAGNOSIS — M545 Low back pain: Secondary | ICD-10-CM | POA: Diagnosis not present

## 2019-09-19 DIAGNOSIS — N1831 Chronic kidney disease, stage 3a: Secondary | ICD-10-CM | POA: Diagnosis not present

## 2019-09-19 DIAGNOSIS — Z880 Allergy status to penicillin: Secondary | ICD-10-CM | POA: Diagnosis not present

## 2019-09-19 DIAGNOSIS — Z79899 Other long term (current) drug therapy: Secondary | ICD-10-CM | POA: Diagnosis not present

## 2019-09-19 DIAGNOSIS — I129 Hypertensive chronic kidney disease with stage 1 through stage 4 chronic kidney disease, or unspecified chronic kidney disease: Secondary | ICD-10-CM | POA: Diagnosis not present

## 2019-09-19 DIAGNOSIS — N189 Chronic kidney disease, unspecified: Secondary | ICD-10-CM | POA: Diagnosis not present

## 2019-09-19 DIAGNOSIS — G894 Chronic pain syndrome: Secondary | ICD-10-CM | POA: Diagnosis not present

## 2019-09-19 DIAGNOSIS — Z87891 Personal history of nicotine dependence: Secondary | ICD-10-CM | POA: Diagnosis not present

## 2019-09-19 DIAGNOSIS — R7401 Elevation of levels of liver transaminase levels: Secondary | ICD-10-CM | POA: Diagnosis not present

## 2019-09-19 DIAGNOSIS — E1122 Type 2 diabetes mellitus with diabetic chronic kidney disease: Secondary | ICD-10-CM | POA: Diagnosis not present

## 2019-09-19 DIAGNOSIS — N281 Cyst of kidney, acquired: Secondary | ICD-10-CM | POA: Diagnosis not present

## 2019-09-19 DIAGNOSIS — E860 Dehydration: Secondary | ICD-10-CM | POA: Diagnosis not present

## 2019-09-19 DIAGNOSIS — Z905 Acquired absence of kidney: Secondary | ICD-10-CM | POA: Diagnosis not present

## 2019-09-19 DIAGNOSIS — N179 Acute kidney failure, unspecified: Secondary | ICD-10-CM | POA: Diagnosis present

## 2019-09-19 LAB — CBC
HCT: 31.9 % — ABNORMAL LOW (ref 36.0–46.0)
Hemoglobin: 10.5 g/dL — ABNORMAL LOW (ref 12.0–15.0)
MCH: 32.3 pg (ref 26.0–34.0)
MCHC: 32.9 g/dL (ref 30.0–36.0)
MCV: 98.2 fL (ref 80.0–100.0)
Platelets: 303 10*3/uL (ref 150–400)
RBC: 3.25 MIL/uL — ABNORMAL LOW (ref 3.87–5.11)
RDW: 13.1 % (ref 11.5–15.5)
WBC: 8.5 10*3/uL (ref 4.0–10.5)
nRBC: 0 % (ref 0.0–0.2)

## 2019-09-19 LAB — URINALYSIS, ROUTINE W REFLEX MICROSCOPIC
Bacteria, UA: NONE SEEN
Bilirubin Urine: NEGATIVE
Glucose, UA: NEGATIVE mg/dL
Ketones, ur: NEGATIVE mg/dL
Leukocytes,Ua: NEGATIVE
Nitrite: NEGATIVE
Protein, ur: NEGATIVE mg/dL
Specific Gravity, Urine: 1.01 (ref 1.005–1.030)
pH: 5 (ref 5.0–8.0)

## 2019-09-19 LAB — BASIC METABOLIC PANEL
Anion gap: 9 (ref 5–15)
BUN: 30 mg/dL — ABNORMAL HIGH (ref 6–20)
CO2: 21 mmol/L — ABNORMAL LOW (ref 22–32)
Calcium: 8.7 mg/dL — ABNORMAL LOW (ref 8.9–10.3)
Chloride: 108 mmol/L (ref 98–111)
Creatinine, Ser: 2.22 mg/dL — ABNORMAL HIGH (ref 0.44–1.00)
GFR calc Af Amer: 27 mL/min — ABNORMAL LOW (ref >60)
GFR calc non Af Amer: 23 mL/min — ABNORMAL LOW (ref >60)
Glucose, Bld: 172 mg/dL — ABNORMAL HIGH (ref 70–99)
Potassium: 4.3 mmol/L (ref 3.5–5.1)
Sodium: 138 mmol/L (ref 135–145)

## 2019-09-19 LAB — PROTEIN / CREATININE RATIO, URINE
Creatinine, Urine: 140.18 mg/dL
Protein Creatinine Ratio: 0.21 mg/mg{Cre} — ABNORMAL HIGH (ref 0.00–0.15)
Total Protein, Urine: 29 mg/dL

## 2019-09-19 LAB — GLUCOSE, CAPILLARY
Glucose-Capillary: 141 mg/dL — ABNORMAL HIGH (ref 70–99)
Glucose-Capillary: 167 mg/dL — ABNORMAL HIGH (ref 70–99)
Glucose-Capillary: 85 mg/dL (ref 70–99)
Glucose-Capillary: 156 mg/dL — ABNORMAL HIGH (ref 70–99)
Glucose-Capillary: 86 mg/dL (ref 70–99)

## 2019-09-19 LAB — MAGNESIUM: Magnesium: 1.7 mg/dL (ref 1.7–2.4)

## 2019-09-19 LAB — SODIUM, URINE, RANDOM: Sodium, Ur: 29 mmol/L

## 2019-09-19 LAB — PHOSPHORUS: Phosphorus: 4.5 mg/dL (ref 2.5–4.6)

## 2019-09-19 MED ORDER — ACETAMINOPHEN 650 MG RE SUPP: 650.0000 mg | Freq: Four times a day (QID) | RECTAL | Status: DC | PRN

## 2019-09-19 MED ORDER — QUETIAPINE FUMARATE 100 MG PO TABS
100.0000 mg | ORAL_TABLET | Freq: Every day | ORAL | Status: DC
  Administered 2019-09-19 – 2019-09-20 (×2): 100 mg via ORAL
  Filled 2019-09-19 (×2): qty 1

## 2019-09-19 MED ORDER — MORPHINE SULFATE ER 30 MG PO TBCR
30.0000 mg | EXTENDED_RELEASE_TABLET | Freq: Two times a day (BID) | ORAL | Status: DC
  Administered 2019-09-19: 30 mg via ORAL
  Filled 2019-09-19: qty 1

## 2019-09-19 MED ORDER — SODIUM CHLORIDE 0.9 % IV SOLN: INTRAVENOUS | Status: DC

## 2019-09-19 MED ORDER — OXYCODONE HCL 5 MG PO TABS
5.0000 mg | ORAL_TABLET | ORAL | Status: DC | PRN
  Administered 2019-09-19 (×3): 5 mg via ORAL
  Filled 2019-09-19 (×3): qty 1

## 2019-09-19 MED ORDER — ACETAMINOPHEN 325 MG PO TABS
650.0000 mg | ORAL_TABLET | Freq: Four times a day (QID) | ORAL | Status: DC | PRN
  Administered 2019-09-19 – 2019-09-20 (×2): 650 mg via ORAL
  Filled 2019-09-19 (×2): qty 2

## 2019-09-19 MED ORDER — INSULIN ASPART 100 UNIT/ML ~~LOC~~ SOLN: 0.0000 [IU] | Freq: Every day | SUBCUTANEOUS | Status: DC

## 2019-09-19 MED ORDER — ONDANSETRON HCL 4 MG/2ML IJ SOLN: 4.0000 mg | Freq: Four times a day (QID) | INTRAMUSCULAR | Status: DC | PRN

## 2019-09-19 MED ORDER — HYDROCODONE-ACETAMINOPHEN 10-325 MG PO TABS: 1.0000 | ORAL_TABLET | Freq: Four times a day (QID) | ORAL | Status: DC | PRN

## 2019-09-19 MED ORDER — HYDROMORPHONE HCL 1 MG/ML IJ SOLN: 0.5000 mg | INTRAMUSCULAR | Status: DC | PRN

## 2019-09-19 MED ORDER — SODIUM CHLORIDE 0.9 % IV SOLN
INTRAVENOUS | Status: DC
  Administered 2019-09-19: 75 mL/h via INTRAVENOUS

## 2019-09-19 MED ORDER — INSULIN ASPART 100 UNIT/ML ~~LOC~~ SOLN
0.0000 [IU] | Freq: Three times a day (TID) | SUBCUTANEOUS | Status: DC
  Administered 2019-09-19 (×2): 2 [IU] via SUBCUTANEOUS
  Administered 2019-09-20: 1 [IU] via SUBCUTANEOUS

## 2019-09-19 MED ORDER — ONDANSETRON HCL 4 MG PO TABS: 4.0000 mg | ORAL_TABLET | Freq: Four times a day (QID) | ORAL | Status: DC | PRN

## 2019-09-19 MED ORDER — TIZANIDINE HCL 4 MG PO TABS
4.0000 mg | ORAL_TABLET | Freq: Three times a day (TID) | ORAL | Status: DC | PRN
  Administered 2019-09-19 (×2): 4 mg via ORAL
  Filled 2019-09-19 (×2): qty 1

## 2019-09-19 MED ORDER — HEPARIN SODIUM (PORCINE) 5000 UNIT/ML IJ SOLN
5000.0000 [IU] | Freq: Three times a day (TID) | INTRAMUSCULAR | Status: DC
  Administered 2019-09-19 – 2019-09-20 (×6): 5000 [IU] via SUBCUTANEOUS
  Filled 2019-09-19 (×6): qty 1

## 2019-09-19 MED ORDER — QUETIAPINE FUMARATE 100 MG PO TABS
200.0000 mg | ORAL_TABLET | Freq: Every day | ORAL | Status: DC
  Administered 2019-09-19: 200 mg via ORAL
  Filled 2019-09-19: qty 2

## 2019-09-19 MED ORDER — LEVOFLOXACIN IN D5W 250 MG/50ML IV SOLN
250.0000 mg | INTRAVENOUS | Status: DC
  Administered 2019-09-19 – 2019-09-20 (×2): 250 mg via INTRAVENOUS
  Filled 2019-09-19 (×2): qty 50

## 2019-09-19 NOTE — ED Notes (Signed)
Pt's daughter updated on status and plans of transport. Family member appreciative.

## 2019-09-19 NOTE — H&P (Signed)
History and Physical    Amy Nunez [id number] DOB: 1960/09/25 DOA: 09/18/2019  PCP: Einar Pheasant, DO  Patient coming from: Home  Chief Complaint: Abdominal pain  HPI: Amy Nunez is a 59 y.o. female with medical history significant of right nephrectomy, diabetes, hypertension, irritable bowel syndrome comes in complaints of nausea and vomiting and diarrhea for the last 3 days associated with back pain and abdominal pain.  Patient has chronic pain issues and reports that this pain is different than normal has bilateral back pain wrapping around to the flank area and groin.  No radiation into the buttocks.  Pain is reproducible with palpation in the paraspinal area.  She denies any dysuria.  She reports no fevers.  Patient reports that her vomiting stopped yesterday and her diarrhea is better however she is not been able to eat anything in 3 days.  She is just starting to eat today.  Patient found to have acute kidney injury with a creatinine bump up to 3.25.  This is slowly risen over the last several weeks previous creatinine 1.28.  She reports no usage of NSAIDs over-the-counter ibuprofen Goody powders are BC powders.  Patient reports she was just discharged for call for the same exact reason last week however looking at records there is no mention of her having any nausea vomiting diarrhea she was mainly admitted for rule out CVA.  Patient is being referred for admission for acute kidney injury in the setting of a recent GI illness.  Review of Systems: As per HPI otherwise 10 point review of systems negative.   Past Medical History:  Diagnosis Date  . Blood transfusion without reported diagnosis   . Cancer [facility])    renal cancer  . Diabetes mellitus without complication ([place])   . Hypertension   . IBS (irritable bowel syndrome)     Past Surgical History:  Procedure Laterality Date  . ABDOMINAL HYSTERECTOMY    . ANKLE SURGERY    . NEPHRECTOMY Right      reports that  she has quit smoking. She has never used smokeless tobacco. She reports previous alcohol use. She reports that she does not use drugs.  Allergies  Allergen Reactions  . Penicillins Anaphylaxis and Rash    Other reaction(s): Unknown  . Ciprofloxacin Hives  . Red Dye Diarrhea    Other reaction(s): Unknown    No family history on file.  Prior to Admission medications   Medication Sig Start Date End Date Taking? Authorizing Provider  amLODipine (NORVASC) 10 MG tablet Take 10 mg by mouth daily. 07/17/19  Yes [provider]  aspirin-acetaminophen-caffeine (EXCEDRIN MIGRAINE) [phone] MG tablet Take 1 tablet by mouth every 6 (six) hours as needed for headache.   Yes [provider]  atorvastatin (LIPITOR) 80 MG tablet Take 80 mg by mouth daily. 07/17/19  Yes [provider]  dexamethasone (DECADRON) 4 MG tablet Take 1 tablet (4 mg total) by mouth 2 (two) times daily. 06/18/19  Yes Virgel Manifold, MD  gabapentin (NEURONTIN) 800 MG tablet Take 1 tablet (800 mg total) by mouth 2 (two) times daily. Patient taking differently: Take 800 mg by mouth 2 (two) times daily as needed (pain).  09/04/19  Yes Mariel Aloe, MD  HYDROcodone-acetaminophen (NORCO) 10-325 MG tablet Take 1 tablet by mouth every 6 (six) hours as needed (pain).  08/28/19  Yes [provider]  hydrOXYzine (ATARAX/VISTARIL) 25 MG tablet Take 25 mg by mouth every 6 (six) hours as needed for  itching. 07/30/19  Yes [provider]  insulin glulisine (APIDRA) 100 UNIT/ML injection Inject 10 Units into the skin 3 (three) times daily before meals.   Yes [provider]  LANTUS SOLOSTAR 100 UNIT/ML Solostar Pen Inject 60 Units into the skin at bedtime. 08/19/19  Yes [provider]  lisinopril (ZESTRIL) 40 MG tablet Take 40 mg by mouth daily. 07/17/19  Yes [provider]  morphine (MS CONTIN) 30 MG 12 hr tablet Take 30 mg by mouth every 12 (twelve) hours. 08/28/19  Yes  [provider]  ondansetron (ZOFRAN) 4 MG tablet Take 1 tablet (4 mg total) by mouth every 8 (eight) hours as needed for nausea or vomiting. 09/16/19  Yes Mesner, Corene Cornea, MD  promethazine (PHENERGAN) 25 MG suppository Place 1 suppository (25 mg total) rectally every 6 (six) hours as needed for nausea or vomiting. If oral medication not staying down. 09/16/19  Yes Mesner, Corene Cornea, MD  QUEtiapine (SEROQUEL) 200 MG tablet Take 0.5-1 tablets by mouth See admin instructions. Take 0.5 tablet in the morning and 1 tablets at night. 07/17/19  Yes [provider]  tiZANidine (ZANAFLEX) 4 MG tablet Take 4 mg by mouth every 8 (eight) hours as needed for muscle spasms.  08/25/19  Yes [provider]  traMADol (ULTRAM) 50 MG tablet Take 50 mg by mouth 3 (three) times daily as needed for pain. 08/25/19  Yes [provider]  traZODone (DESYREL) 50 MG tablet Take 50 mg by mouth at bedtime as needed for sleep. 07/01/19  Yes [provider]    Physical Exam: Vitals:   09/18/19 2010 09/18/19 2331 09/19/19 0029 09/19/19 0121  BP: (!) 105/54 107/66 101/69 129/78  Pulse: (!) 58 65 68 69  Resp: 16 19 17 17   Temp:    97.6 F (36.4 C)  TempSrc:    Oral  SpO2: 99% 97% 97% 99%  Weight:      Height:          Constitutional: NAD, calm, comfortable Vitals:   09/18/19 2010 09/18/19 2331 09/19/19 0029 09/19/19 0121  BP: (!) 105/54 107/66 101/69 129/78  Pulse: (!) 58 65 68 69  Resp: 16 19 17 17   Temp:    97.6 F (36.4 C)  TempSrc:    Oral  SpO2: 99% 97% 97% 99%  Weight:      Height:       Eyes: PERRL, lids and conjunctivae normal ENMT: Mucous membranes are moist. Posterior pharynx clear of any exudate or lesions.Normal dentition.  Neck: normal, supple, no masses, no thyromegaly Respiratory: clear to auscultation bilaterally, no wheezing, no crackles. Normal respiratory effort. No accessory muscle use.  Cardiovascular: Regular rate and rhythm, no murmurs / rubs /  gallops. No extremity edema. 2+ pedal pulses. No carotid bruits.  Abdomen: no tenderness, no masses palpated. No hepatosplenomegaly. Bowel sounds positive.  Musculoskeletal: no clubbing / cyanosis. No joint deformity upper and lower extremities. Good ROM, no contractures. Normal muscle tone.  Reproducible pair spinal pain with palpation mainly in the lumbar region Skin: no rashes, lesions, ulcers. No induration Neurologic: CN 2-12 grossly intact. Sensation intact, DTR normal. Strength 5/5 in all 4.  Psychiatric: Normal judgment and insight. Alert and oriented x 3. Normal mood.    Labs on Admission: I have personally reviewed following labs and imaging studies  CBC: Recent Labs  Lab 09/15/19 2139 09/18/19 1710  WBC 12.0* 9.1  NEUTROABS 7.9* 6.5  HGB 12.5 9.9*  HCT 37.9 29.8*  MCV 96.7 97.1  PLT 371 [id number]   Basic Metabolic Panel: Recent Labs  Lab 09/15/19 2139 09/18/19 1710  NA 138 134*  K 4.4 3.9  CL 104 102  CO2 22 21*  GLUCOSE 177* 147*  BUN 26* 34*  CREATININE 2.10* 3.24*  CALCIUM 9.5 8.7*   GFR: Estimated Creatinine Clearance: 20.8 mL/min (A) (by C-G formula based on SCr of 3.24 mg/dL (H)). Liver Function Tests: Recent Labs  Lab 09/15/19 2139 09/18/19 1710  AST 24 208*  ALT 24 62*  ALKPHOS 100 82  BILITOT 0.5 0.6  PROT 8.4* 7.1  ALBUMIN 4.5 3.8   Recent Labs  Lab 09/15/19 2139  LIPASE 50   No results for input(s): AMMONIA in the last 168 hours. Coagulation Profile: No results for input(s): INR, PROTIME in the last 168 hours. Cardiac Enzymes: No results for input(s): CKTOTAL, CKMB, CKMBINDEX, TROPONINI in the last 168 hours. BNP (last 3 results) No results for input(s): PROBNP in the last 8760 hours. HbA1C: No results for input(s): HGBA1C in the last 72 hours. CBG: Recent Labs  Lab 09/19/19 0118  GLUCAP 141*   Lipid Profile: No results for input(s): CHOL, HDL, LDLCALC, TRIG, CHOLHDL, LDLDIRECT in the last 72 hours. Thyroid Function Tests: No  results for input(s): TSH, T4TOTAL, FREET4, T3FREE, THYROIDAB in the last 72 hours. Anemia Panel: No results for input(s): VITAMINB12, FOLATE, FERRITIN, TIBC, IRON, RETICCTPCT in the last 72 hours. Urine analysis:    Component Value Date/Time   COLORURINE YELLOW 09/15/2019 2139   APPEARANCEUR CLOUDY (A) 09/15/2019 2139   LABSPEC >1.030 (H) 09/15/2019 2139   PHURINE 5.5 09/15/2019 2139   GLUCOSEU NEGATIVE 09/15/2019 2139   HGBUR NEGATIVE 09/15/2019 2139   BILIRUBINUR NEGATIVE 09/15/2019 2139   KETONESUR NEGATIVE 09/15/2019 2139   PROTEINUR NEGATIVE 09/15/2019 2139   NITRITE NEGATIVE 09/15/2019 2139   LEUKOCYTESUR NEGATIVE 09/15/2019 2139   Sepsis Labs: !!!!!!!!!!!!!!!!!!!!!!!!!!!!!!!!!!!!!!!!!!!! @LABRCNTIP (procalcitonin:4,lacticidven:4) ) Recent Results (from the past 240 hour(s))  SARS CORONAVIRUS 2 (TAT 6-24 HRS) Nasopharyngeal Nasopharyngeal Swab     Status: None   Collection Time: 09/18/19  4:57 PM   Specimen: Nasopharyngeal Swab  Result Value Ref Range Status   SARS Coronavirus 2 NEGATIVE NEGATIVE Final    Comment: (NOTE) SARS-CoV-2 target nucleic acids are NOT DETECTED. The SARS-CoV-2 RNA is generally detectable in upper and lower respiratory specimens during the acute phase of infection. Negative results do not preclude SARS-CoV-2 infection, do not rule out co-infections with other pathogens, and should not be used as the sole basis for treatment or other patient management decisions. Negative results must be combined with clinical observations, patient history, and epidemiological information. The expected result is Negative. Fact Sheet for Patients: SugarRoll.be Fact Sheet for Healthcare Providers: https://www.woods-mathews.com/ This test is not yet approved or cleared by the [place] FDA and  has been authorized for detection and/or diagnosis of SARS-CoV-2 by FDA under an Emergency Use Authorization (EUA). This EUA will  remain  in effect (meaning this test can be used) for the duration of the COVID-19 declaration under Section 56 4(b)(1) of the Act, 21 U.S.C. section 360bbb-3(b)(1), unless the authorization is terminated or revoked sooner. Performed at [facility], [place] [address], [place], [place] [address]      Radiological Exams on Admission: CT ABDOMEN PELVIS WO CONTRAST  Result Date: 09/18/2019 CLINICAL DATA:  Left lower quadrant pain. EXAM: CT ABDOMEN AND PELVIS WITHOUT CONTRAST TECHNIQUE: Multidetector CT imaging of the abdomen and pelvis was performed following the standard protocol without IV contrast.  COMPARISON:  September 01, 2019 FINDINGS: Lower chest: There is atelectasis versus scarring at the lung bases bilaterally.The heart size is normal. Hepatobiliary: The liver is normal. Normal gallbladder.There is no biliary ductal dilation. Pancreas: Normal contours without ductal dilatation. No peripancreatic fluid collection. Spleen: No splenic laceration or hematoma. Adrenals/Urinary Tract: --Adrenal glands: No adrenal hemorrhage. --Right kidney/ureter: The patient is status post right-sided nephrectomy. There is no obvious soft tissue density within the right nephrectomy bed, however evaluation is limited by lack of IV contrast. --Left kidney/ureter: There is no left-sided hydronephrosis. A cyst is again noted in the lower pole. There is questionable mild they increase in perinephric stranding about the left kidney. There are no radiopaque stones. There is suggestion of mild interval increase in size of the left kidney is well. --Urinary bladder: Unremarkable. Stomach/Bowel: --Stomach/Duodenum: No hiatal hernia or other gastric abnormality. Normal duodenal course and caliber. --Small bowel: No dilatation or inflammation. --Colon: No focal abnormality. --Appendix: Normal. Vascular/Lymphatic: Atherosclerotic calcification is present within the non-aneurysmal abdominal aorta, without hemodynamically  significant stenosis. --No retroperitoneal lymphadenopathy. --No mesenteric lymphadenopathy. --No pelvic or inguinal lymphadenopathy. Reproductive: Unremarkable Other: No ascites or free air. The abdominal wall is normal. Musculoskeletal. No acute displaced fractures. IMPRESSION: 1. No definite acute intra-abdominal abnormality detected. 2. Subtle increase in left perinephric stranding and possible slight interval increase in size of the left kidney. Correlation with urinalysis is recommended to help exclude a process such as pyelonephritis. There is no hydronephrosis. There are no radiopaque obstructing kidney stones. 3. Status post right-sided nephrectomy. 4.  Aortic Atherosclerosis (ICD10-I70.0). Electronically Signed   By: Constance Holster M.D.   On: 09/18/2019 19:26   Old chart reviewed Case discussed with EDP  Assessment/Plan 59 year old female with single kidney comes in with recent nausea vomiting diarrhea with acute kidney injury also with lumbar back pain with radiation into the groin area Principal Problem:   Acute kidney injury superimposed on chronic kidney disease (HCC)-patient has had recent GI illness nausea vomiting diarrhea which seems to be resolving.  Unclear if this is due to viral syndrome consideration could be made for possible withdrawal of her opiates.  Creatinine bumped up to 3.24 from 1.28.  Patient given IV fluids already will place on normal saline overnight and repeat renal function in the morning.  Monitor urinary output closely.  Consider nephrology consultation if worsens.  Hold all NSAIDs and nephrotoxic substances.  Hold ACE inhibitor.  Patient has Ultram on her medication list but reports she does not take this.  Active Problems:   Abdominal pain-unclear etiology.  Patient's pain is bilateral and radiates to groin area on both sides and is reproducible in the back area.  Abdominal exam is benign.  CT imaging benign today.  Unclear how much of this is chronic.   Continue home pain medicines.    Diabetes (HCC)-sliding scale insulin    Benign essential HTN-clarify and resume home meds     DVT prophylaxis: SCDs Code Status: Full Family Communication: None Disposition Plan: Days Consults called: None Admission status: Admission   Mathilda Maguire A MD [facility]  If 7PM-7AM, please contact night-coverage [email or web address] Password [facility]  09/19/2019, 2:59 AM

## 2019-09-19 NOTE — Progress Notes (Signed)
Amy Nunez is a 59 y.o. female with medical history significant of right nephrectomy, diabetes, hypertension, irritable bowel syndrome comes in complaints of nausea and vomiting and diarrhea for the last 3 days associated with back pain and abdominal pain.  Patient has chronic pain issues and reports that this pain is different than normal.  Elevated creatinine on presentation well above baseline.  Admitted for AKI in the setting of recent GI illness.  09/19/19: Seen and examined.  Reports persistent severe left flank pain.  Reports recently completed a 10-day course of oral antibiotics about a week ago.  Denies any dysuria at this time or diarrhea.  CT abdomen and pelvis done without contrast on 09/18/2019 showed subtle increase in left perinephric stranding, no hydronephrosis.  Need to rule out pyelonephritis.  Due to persistent symptomatology, will obtain a renal ultrasound and treat with IV antibiotics empirically until urine culture results.   Please refer to H&P dictated by my partner Dr. Shanon Brow on 09/19/2019 for further details of the assessment and plan.

## 2019-09-20 DIAGNOSIS — N179 Acute kidney failure, unspecified: Principal | ICD-10-CM

## 2019-09-20 DIAGNOSIS — N189 Chronic kidney disease, unspecified: Secondary | ICD-10-CM | POA: Diagnosis not present

## 2019-09-20 LAB — CBC WITH DIFFERENTIAL/PLATELET
Abs Immature Granulocytes: 0.02 10*3/uL (ref 0.00–0.07)
Basophils Absolute: 0 10*3/uL (ref 0.0–0.1)
Basophils Relative: 1 %
Eosinophils Absolute: 0.3 10*3/uL (ref 0.0–0.5)
Eosinophils Relative: 5 %
HCT: 30.8 % — ABNORMAL LOW (ref 36.0–46.0)
Hemoglobin: 10.1 g/dL — ABNORMAL LOW (ref 12.0–15.0)
Immature Granulocytes: 0 %
Lymphocytes Relative: 39 %
Lymphs Abs: 2.5 10*3/uL (ref 0.7–4.0)
MCH: 31.8 pg (ref 26.0–34.0)
MCHC: 32.8 g/dL (ref 30.0–36.0)
MCV: 96.9 fL (ref 80.0–100.0)
Monocytes Absolute: 0.5 10*3/uL (ref 0.1–1.0)
Monocytes Relative: 8 %
Neutro Abs: 3 10*3/uL (ref 1.7–7.7)
Neutrophils Relative %: 47 %
Platelets: 322 10*3/uL (ref 150–400)
RBC: 3.18 MIL/uL — ABNORMAL LOW (ref 3.87–5.11)
RDW: 13 % (ref 11.5–15.5)
WBC: 6.4 10*3/uL (ref 4.0–10.5)
nRBC: 0 % (ref 0.0–0.2)

## 2019-09-20 LAB — GLUCOSE, CAPILLARY
Glucose-Capillary: 93 mg/dL (ref 70–99)
Glucose-Capillary: 123 mg/dL — ABNORMAL HIGH (ref 70–99)

## 2019-09-20 LAB — COMPREHENSIVE METABOLIC PANEL
ALT: 64 U/L — ABNORMAL HIGH (ref 0–44)
AST: 143 U/L — ABNORMAL HIGH (ref 15–41)
Albumin: 3.3 g/dL — ABNORMAL LOW (ref 3.5–5.0)
Alkaline Phosphatase: 81 U/L (ref 38–126)
Anion gap: 8 (ref 5–15)
BUN: 24 mg/dL — ABNORMAL HIGH (ref 6–20)
CO2: 21 mmol/L — ABNORMAL LOW (ref 22–32)
Calcium: 8.5 mg/dL — ABNORMAL LOW (ref 8.9–10.3)
Chloride: 110 mmol/L (ref 98–111)
Creatinine, Ser: 1.42 mg/dL — ABNORMAL HIGH (ref 0.44–1.00)
GFR calc Af Amer: 46 mL/min — ABNORMAL LOW (ref >60)
GFR calc non Af Amer: 40 mL/min — ABNORMAL LOW (ref >60)
Glucose, Bld: 127 mg/dL — ABNORMAL HIGH (ref 70–99)
Potassium: 4.3 mmol/L (ref 3.5–5.1)
Sodium: 139 mmol/L (ref 135–145)
Total Bilirubin: 0.3 mg/dL (ref 0.3–1.2)
Total Protein: 6.4 g/dL — ABNORMAL LOW (ref 6.5–8.1)

## 2019-09-20 LAB — LACTIC ACID, PLASMA: Lactic Acid, Venous: 0.8 mmol/L (ref 0.5–1.9)

## 2019-09-20 LAB — PROCALCITONIN: Procalcitonin: <0.1 ng/mL

## 2019-09-20 MED ORDER — AMLODIPINE BESYLATE 10 MG PO TABS: 10.0000 mg | ORAL_TABLET | Freq: Every day | ORAL | Status: DC

## 2019-09-20 MED ORDER — AMLODIPINE BESYLATE 5 MG PO TABS
5.0000 mg | ORAL_TABLET | Freq: Every day | ORAL | Status: DC
  Administered 2019-09-20: 5 mg via ORAL
  Filled 2019-09-20: qty 1

## 2019-09-20 MED ORDER — AMLODIPINE BESYLATE 5 MG PO TABS: 5.0000 mg | ORAL_TABLET | Freq: Every day | ORAL | 0 refills | Status: DC

## 2019-09-20 MED ORDER — LANTUS SOLOSTAR 100 UNIT/ML ~~LOC~~ SOPN: 5.0000 [IU] | PEN_INJECTOR | Freq: Every day | SUBCUTANEOUS | 0 refills | Status: AC

## 2019-09-20 NOTE — Evaluation (Signed)
Physical Therapy Evaluation Patient Details Name: Amy Nunez MRN: [id number] DOB: 17-Dec-1960 Today's Date: 09/20/2019   History of Present Illness  Amy Nunez is a 59 y.o. female with medical history significant of R nephrectomy, DM, HTN, IBS. Admitted with abdominal pain, N/V/D and dx with AKI  Clinical Impression  Pt admitted as above and presenting with functional mobility limitations 2* generalized weakness and ambulatory balance deficits.  Pt admits to being slightly less steady than normal but willing to utilize RW at home temporarily.  Pt should progress to dc home with family assist.    Follow Up Recommendations Supervision for mobility/OOB    Equipment Recommendations  None recommended by PT    Recommendations for Other Services       Precautions / Restrictions Precautions Precautions: Fall Precaution Comments: Pt admits not as steady as her normal Restrictions Weight Bearing Restrictions: No      Mobility  Bed Mobility Overal bed mobility: Independent                Transfers Overall transfer level: Modified independent Equipment used: None Transfers: Sit to/from Stand Sit to Stand: Supervision         General transfer comment: supervised but could stand alone  Ambulation/Gait Ambulation/Gait assistance: Min guard;Supervision Gait Distance (Feet): 300 Feet Assistive device: None;Rolling walker (2 wheeled) Gait Pattern/deviations: Step-through pattern;Wide base of support;Shuffle Gait velocity: reduced   General Gait Details: general mild instability but with no LOB; improvement in stability with use of RW  Stairs            Wheelchair Mobility    Modified Rankin (Stroke Patients Only)       Balance Overall balance assessment: Needs assistance Sitting-balance support: Feet supported Sitting balance-Leahy Scale: Good     Standing balance support: No upper extremity supported Standing balance-Leahy Scale: Fair                                Pertinent Vitals/Pain Pain Assessment: Faces Faces Pain Scale: Hurts little more Pain Location: back Pain Descriptors / Indicators: Grimacing;Guarding Pain Intervention(s): Limited activity within patient's tolerance;Monitored during session    Home Living Family/patient expects to be discharged to:: Private residence Living Arrangements: Children Available Help at Discharge: Family;Available 24 hours/day Type of Home: House Home Access: Level entry     Home Layout: Two level;Able to live on main level with bedroom/bathroom Home Equipment: Gilford Rile - 2 wheels Additional Comments: pt lives with her daughter and 3 grandaughter (27, 1 in college), grandson (93)    Prior Function Level of Independence: Needs assistance   Gait / Transfers Assistance Needed: no AD   ADL's / Homemaking Assistance Needed: daughter assists with IADL (grocery shopping, driving, medication management);otherwise pt is independent         Hand Dominance   Dominant Hand: Right    Extremity/Trunk Assessment   Upper Extremity Assessment Upper Extremity Assessment: Generalized weakness    Lower Extremity Assessment Lower Extremity Assessment: Generalized weakness    Cervical / Trunk Assessment Cervical / Trunk Assessment: Normal  Communication   Communication: No difficulties  Cognition Arousal/Alertness: Awake/alert Behavior During Therapy: WFL for tasks assessed/performed Overall Cognitive Status: Within Functional Limits for tasks assessed  General Comments      Exercises     Assessment/Plan    PT Assessment Patient needs continued PT services  PT Problem List Decreased strength;Decreased activity tolerance;Decreased balance;Decreased mobility;Decreased knowledge of use of DME       PT Treatment Interventions DME instruction;Gait training;Functional mobility training;Therapeutic  activities;Therapeutic exercise;Balance training;Neuromuscular re-education;Patient/family education    PT Goals (Current goals can be found in the Care Plan section)  Acute Rehab PT Goals Patient Stated Goal: to go home  PT Goal Formulation: With patient Time For Goal Achievement: 09/10/19 Potential to Achieve Goals: Good    Frequency Min 3X/week   Barriers to discharge        Co-evaluation               AM-PAC PT "6 Clicks" Mobility  Outcome Measure Help needed turning from your back to your side while in a flat bed without using bedrails?: None Help needed moving from lying on your back to sitting on the side of a flat bed without using bedrails?: None Help needed moving to and from a bed to a chair (including a wheelchair)?: A Little Help needed standing up from a chair using your arms (e.g., wheelchair or bedside chair)?: A Little Help needed to walk in hospital room?: A Little Help needed climbing 3-5 steps with a railing? : A Little 6 Click Score: 20    End of Session Equipment Utilized During Treatment: Gait belt Activity Tolerance: Patient tolerated treatment well Patient left: in chair;with call bell/phone within reach Nurse Communication: Mobility status;Other (comment) PT Visit Diagnosis: Unsteadiness on feet (R26.81);Muscle weakness (generalized) (M62.81);Pain    Time: 1128-1150 PT Time Calculation (min) (ACUTE ONLY): 22 min   Charges:   PT Evaluation $PT Eval Low Complexity: 1 Low          [place] Pager [phone] Office [phone]   Luba Matzen 09/20/2019, 12:23 PM

## 2019-09-20 NOTE — Discharge Summary (Signed)
Discharge Summary  Amy Nunez [id number] DOB: 1961-07-27  PCP: Einar Pheasant, DO  Admit date: 09/18/2019 Discharge date: 09/20/2019  Time spent: 35 minutes.  Recommendations for Outpatient Follow-up:  1. Follow-up with your primary care provider 2. Take your medications as prescribed.  Discharge Diagnoses:  Active Hospital Problems   Diagnosis Date Noted  . Acute kidney injury superimposed on chronic kidney disease ([place]) 09/18/2019  . AKI (acute kidney injury) ([place]) 09/19/2019  . Diabetes ([place]) 09/03/2019  . Benign essential HTN 09/03/2019  . Abdominal pain 09/03/2019    Resolved Hospital Problems  No resolved problems to display.    Discharge Condition: Stable  Diet recommendation: Resume previous diet.  Vitals:   09/20/19 0748 09/20/19 0751  BP: 140/89 134/79  Pulse: 78 93  Resp:    Temp:    SpO2: 99% 98%    History of present illness:  Amy J Nanceis a 59 y.o.femalewith medical history significant ofright nephrectomy, insulin-dependent type 2 diabetes, hypertension, irritable bowel syndrome comes in complaints of nausea and vomiting and diarrhea for the last 3 days associated with back pain and abdominal pain. Patient has chronic pain syndrome and reports that this pain is different than normal.  Elevated creatinine on presentation well above baseline.  Admitted for AKI in the setting of recent GI illness. CT abdomen and pelvis done without contrast on 09/18/2019 showed subtle increase in left perinephric stranding, no hydronephrosis.    Received empiric IV antibiotics x1 day.  Ruled out pyelonephritis with negative UA.  Renal ultrasound showed no hydronephrosis in solitary left kidney.  Responded well to IV fluid with improvement of AKI.  GI symptomatology also improved and tolerating a regular consistency diet.  09/20/19: Seen and examined.  No acute events overnight.  No new complaints.  Left flank pain is persistent but improved.  Discussed,  curbsided with urology Dr. Anthony Sar.  Left kidney cyst likely cause of left flank pain, treated symptomatically.  Could take up to a week for the pain to resolve.  No need to follow-up with urology outpatient.  No evidence of systemic infection.  Afebrile, no tachycardia, no leukocytosis.    Hospital Course:  Principal Problem:   Acute kidney injury superimposed on chronic kidney disease ([place]) Active Problems:   Abdominal pain   Diabetes ([place])   Benign essential HTN   AKI (acute kidney injury) ([place])  Resolving AKI on CKD 3A in the setting of solitary left kidney.   Presented with creatinine of 3.24 Baseline creatinine appears to be 1.4 with GFR 46. Responded well to IV fluid Appears to be back to her baseline creatinine Avoid nephrotoxins and NSAIDs Avoid hypotension Continue to hold off home lisinopril Follow-up with your primary care provider.  Acutely elevated AST and ALT With acute bump in AST and ALT, unclear etiology Trending down Follow-up with your PCP Repeat CMP on Wednesday, 09/24/2019.  Left lower pole kidney cyst Seen on renal ultrasound Measuring approximately 3.3 cm No evidence of hydronephrosis Discussed with urology as stated above.  Essential hypertension Resume amlodipine 5 mg once Continue to hold off lisinopril due to AKI Blood pressure stable Follow-up with your PCP  Insulin-dependent type 2 diabetes Patient was on large doses of Lantus Patient has no required hemorrhage during this hospitalization Dose of Lantus reduced to 5 units nightly Continue NovoLog Hemoglobin A1c 8.4 on 09/02/2019. Advised to closely follow-up with her primary care provider  Chronic pain syndrome Patient advised to follow-up with her provider who prescribes her pain medications  Needs to be followed  Code Status: Full    Procedures:  None.  Consultations:  Curb sided with urology Dr. Anthony Sar on 09/20/19.  Discharge Exam: BP 134/79 (BP Location: Left Arm)   Pulse 93    Temp 98.3 F (36.8 C) (Oral)   Resp 16   Ht 5' 5.5" (1.664 m)   Wt 95.4 kg   SpO2 98%   BMI 34.46 kg/m  . General: 59 y.o. year-old female well developed well nourished in no acute distress.  Alert and oriented x3. . Cardiovascular: Regular rate and rhythm with no rubs or gallops.  No thyromegaly or JVD noted.   Marland Kitchen Respiratory: Clear to auscultation with no wheezes or rales. Good inspiratory effort. . Abdomen: Soft nontender nondistended with normal bowel sounds x4 quadrants.  Mild left flank pain, improving. . Musculoskeletal: No lower extremity edema. 2/4 pulses in all 4 extremities. Marland Kitchen Psychiatry: Mood is appropriate for condition and setting  Discharge Instructions You were cared for by a hospitalist during your hospital stay. If you have any questions about your discharge medications or the care you received while you were in the hospital after you are discharged, you can call the unit and asked to speak with the hospitalist on call if the hospitalist that took care of you is not available. Once you are discharged, your primary care physician will handle any further medical issues. Please note that NO REFILLS for any discharge medications will be authorized once you are discharged, as it is imperative that you return to your primary care physician (or establish a relationship with a primary care physician if you do not have one) for your aftercare needs so that they can reassess your need for medications and monitor your lab values.   Allergies as of 09/20/2019      Reactions   Penicillins Anaphylaxis, Rash   Other reaction(s): Unknown   Ciprofloxacin Hives   Red Dye Diarrhea   Other reaction(s): Unknown      Medication List    STOP taking these medications   Apidra 100 UNIT/ML injection Generic drug: insulin glulisine   aspirin-acetaminophen-caffeine 250-250-65 MG tablet Commonly known as: EXCEDRIN MIGRAINE   atorvastatin 80 MG tablet Commonly known as: LIPITOR     dexamethasone 4 MG tablet Commonly known as: DECADRON   lisinopril 40 MG tablet Commonly known as: ZESTRIL   ondansetron 4 MG tablet Commonly known as: ZOFRAN   traZODone 50 MG tablet Commonly known as: DESYREL     TAKE these medications   amLODipine 5 MG tablet Commonly known as: NORVASC Take 1 tablet (5 mg total) by mouth daily. What changed:   medication strength  how much to take   gabapentin 800 MG tablet Commonly known as: NEURONTIN Take 1 tablet (800 mg total) by mouth 2 (two) times daily. What changed:   when to take this  reasons to take this   HYDROcodone-acetaminophen 10-325 MG tablet Commonly known as: NORCO Take 1 tablet by mouth every 6 (six) hours as needed (pain).   hydrOXYzine 25 MG tablet Commonly known as: ATARAX/VISTARIL Take 25 mg by mouth every 6 (six) hours as needed for itching.   Lantus SoloStar 100 UNIT/ML Solostar Pen Generic drug: Insulin Glargine Inject 5 Units into the skin at bedtime. What changed: how much to take   morphine 30 MG 12 hr tablet Commonly known as: MS CONTIN Take 30 mg by mouth every 12 (twelve) hours.   promethazine 25 MG suppository Commonly known as: PHENERGAN Place 1  suppository (25 mg total) rectally every 6 (six) hours as needed for nausea or vomiting. If oral medication not staying down.   QUEtiapine 200 MG tablet Commonly known as: SEROQUEL Take 0.5-1 tablets by mouth See admin instructions. Take 0.5 tablet in the morning and 1 tablets at night.   tiZANidine 4 MG tablet Commonly known as: ZANAFLEX Take 4 mg by mouth every 8 (eight) hours as needed for muscle spasms.   traMADol 50 MG tablet Commonly known as: ULTRAM Take 50 mg by mouth 3 (three) times daily as needed for pain.      Allergies  Allergen Reactions  . Penicillins Anaphylaxis and Rash    Other reaction(s): Unknown  . Ciprofloxacin Hives  . Red Dye Diarrhea    Other reaction(s): Unknown   Follow-up Information    Einar Pheasant, DO Follow up.   Specialty: Family Medicine Contact information: [address] Kristeen Mans [address] [place] [address] [phone]            The results of significant diagnostics from this hospitalization (including imaging, microbiology, ancillary and laboratory) are listed below for reference.    Significant Diagnostic Studies: CT ABDOMEN PELVIS WO CONTRAST  Result Date: 09/18/2019 CLINICAL DATA:  Left lower quadrant pain. EXAM: CT ABDOMEN AND PELVIS WITHOUT CONTRAST TECHNIQUE: Multidetector CT imaging of the abdomen and pelvis was performed following the standard protocol without IV contrast. COMPARISON:  September 01, 2019 FINDINGS: Lower chest: There is atelectasis versus scarring at the lung bases bilaterally.The heart size is normal. Hepatobiliary: The liver is normal. Normal gallbladder.There is no biliary ductal dilation. Pancreas: Normal contours without ductal dilatation. No peripancreatic fluid collection. Spleen: No splenic laceration or hematoma. Adrenals/Urinary Tract: --Adrenal glands: No adrenal hemorrhage. --Right kidney/ureter: The patient is status post right-sided nephrectomy. There is no obvious soft tissue density within the right nephrectomy bed, however evaluation is limited by lack of IV contrast. --Left kidney/ureter: There is no left-sided hydronephrosis. A cyst is again noted in the lower pole. There is questionable mild they increase in perinephric stranding about the left kidney. There are no radiopaque stones. There is suggestion of mild interval increase in size of the left kidney is well. --Urinary bladder: Unremarkable. Stomach/Bowel: --Stomach/Duodenum: No hiatal hernia or other gastric abnormality. Normal duodenal course and caliber. --Small bowel: No dilatation or inflammation. --Colon: No focal abnormality. --Appendix: Normal. Vascular/Lymphatic: Atherosclerotic calcification is present within the non-aneurysmal abdominal aorta, without hemodynamically  significant stenosis. --No retroperitoneal lymphadenopathy. --No mesenteric lymphadenopathy. --No pelvic or inguinal lymphadenopathy. Reproductive: Unremarkable Other: No ascites or free air. The abdominal wall is normal. Musculoskeletal. No acute displaced fractures. IMPRESSION: 1. No definite acute intra-abdominal abnormality detected. 2. Subtle increase in left perinephric stranding and possible slight interval increase in size of the left kidney. Correlation with urinalysis is recommended to help exclude a process such as pyelonephritis. There is no hydronephrosis. There are no radiopaque obstructing kidney stones. 3. Status post right-sided nephrectomy. 4.  Aortic Atherosclerosis (ICD10-I70.0). Electronically Signed   By: Constance Holster M.D.   On: 09/18/2019 19:26   CT ABDOMEN PELVIS WO CONTRAST  Result Date: 09/01/2019 CLINICAL DATA:  Abdominal pain with nausea, vomiting, and diarrhea. Fever. EXAM: CT ABDOMEN AND PELVIS WITHOUT CONTRAST TECHNIQUE: Multidetector CT imaging of the abdomen and pelvis was performed following the standard protocol without IV contrast. COMPARISON:  None. FINDINGS: Lower chest: Minimal linear scarring or atelectasis at the lung bases. Heart size is normal. Aortic atherosclerosis. Hepatobiliary: No focal liver abnormality is seen. No  gallstones, gallbladder wall thickening, or biliary dilatation. Pancreas: Unremarkable. No pancreatic ductal dilatation or surrounding inflammatory changes. Spleen: Normal in size without focal abnormality. Adrenals/Urinary Tract: Right nephrectomy. Adrenal glands are normal. 3.7 cm cyst on the lower pole of the hypertrophied left kidney. No hydronephrosis. The bladder is empty. Stomach/Bowel: Stomach is within normal limits. Appendix appears normal. No evidence of bowel wall thickening, distention, or inflammatory changes. There are few diverticula in the ascending colon. The appendix is prominent but there are no inflammatory changes and there is  no appendicolith. Vascular/Lymphatic: Aortic atherosclerosis. No enlarged abdominal or pelvic lymph nodes. Reproductive: Status post hysterectomy. No adnexal masses. Other: No abdominal wall hernia or abnormality. No abdominopelvic ascites. Musculoskeletal: No acute or significant osseous findings. IMPRESSION: Benign-appearing abdomen and pelvis. Electronically Signed   By: Lorriane Shire M.D.   On: 09/01/2019 17:50   CT Head Wo Contrast  Result Date: 09/01/2019 CLINICAL DATA:  Headache, fever, and left-sided weakness. EXAM: CT HEAD WITHOUT CONTRAST CT CERVICAL SPINE WITHOUT CONTRAST TECHNIQUE: Multidetector CT imaging of the head and cervical spine was performed following the standard protocol without intravenous contrast. Multiplanar CT image reconstructions of the cervical spine were also generated. COMPARISON:  None. FINDINGS: CT HEAD FINDINGS Brain: No evidence of acute infarction, hemorrhage, hydrocephalus, extra-axial collection or mass lesion/mass effect. Vascular: No hyperdense vessel or unexpected calcification. Skull: Normal. Negative for fracture or focal lesion. Sinuses/Orbits: Normal. Other: None CT CERVICAL SPINE FINDINGS Alignment: Straightening of the cervical lordosis. 2 mm anterolisthesis of C3 on C4. Skull base and vertebrae: No acute fracture. No primary bone lesion or focal pathologic process. Soft tissues and spinal canal: No prevertebral fluid or swelling. No visible canal hematoma. Disc levels:  C2-3: Negative. C3-4: Disc space narrowing. Minimal anterolisthesis. Small uncinate spurs extend into the left and right neural foraminal with slight left foraminal narrowing. No soft disc protrusions. C4-5: Disc space narrowing. Small broad-based disc osteophyte complex narrowing both lateral recesses. Moderate bilateral foraminal narrowing. C5-6: Disc space narrowing. Broad-based disc osteophyte complex with a central disc protrusion which narrows the AP diameter of the spinal canal. Moderate  bilateral foraminal narrowing. C6-7: Disc space narrowing. Slight bilateral foraminal narrowing, right greater than left. C7-T1: Normal. Upper chest: Negative. Other: None IMPRESSION: 1. Normal CT scan of the head. 2. No acute abnormality of the cervical spine. Multilevel degenerative disc and joint disease. Electronically Signed   By: Lorriane Shire M.D.   On: 09/01/2019 17:43   CT Cervical Spine Wo Contrast  Result Date: 09/01/2019 CLINICAL DATA:  Headache, fever, and left-sided weakness. EXAM: CT HEAD WITHOUT CONTRAST CT CERVICAL SPINE WITHOUT CONTRAST TECHNIQUE: Multidetector CT imaging of the head and cervical spine was performed following the standard protocol without intravenous contrast. Multiplanar CT image reconstructions of the cervical spine were also generated. COMPARISON:  None. FINDINGS: CT HEAD FINDINGS Brain: No evidence of acute infarction, hemorrhage, hydrocephalus, extra-axial collection or mass lesion/mass effect. Vascular: No hyperdense vessel or unexpected calcification. Skull: Normal. Negative for fracture or focal lesion. Sinuses/Orbits: Normal. Other: None CT CERVICAL SPINE FINDINGS Alignment: Straightening of the cervical lordosis. 2 mm anterolisthesis of C3 on C4. Skull base and vertebrae: No acute fracture. No primary bone lesion or focal pathologic process. Soft tissues and spinal canal: No prevertebral fluid or swelling. No visible canal hematoma. Disc levels:  C2-3: Negative. C3-4: Disc space narrowing. Minimal anterolisthesis. Small uncinate spurs extend into the left and right neural foraminal with slight left foraminal narrowing. No soft disc protrusions. C4-5: Disc space narrowing.  Small broad-based disc osteophyte complex narrowing both lateral recesses. Moderate bilateral foraminal narrowing. C5-6: Disc space narrowing. Broad-based disc osteophyte complex with a central disc protrusion which narrows the AP diameter of the spinal canal. Moderate bilateral foraminal narrowing.  C6-7: Disc space narrowing. Slight bilateral foraminal narrowing, right greater than left. C7-T1: Normal. Upper chest: Negative. Other: None IMPRESSION: 1. Normal CT scan of the head. 2. No acute abnormality of the cervical spine. Multilevel degenerative disc and joint disease. Electronically Signed   By: Lorriane Shire M.D.   On: 09/01/2019 17:43   MR BRAIN WO CONTRAST  Result Date: 09/03/2019 CLINICAL DATA:  Left-sided weakness EXAM: MRI HEAD WITHOUT CONTRAST TECHNIQUE: Multiplanar, multiecho pulse sequences of the brain and surrounding structures were obtained without intravenous contrast. COMPARISON:  None. FINDINGS: Brain: There is no acute infarction or intracranial hemorrhage. There is no intracranial mass, mass effect, or edema. There is no hydrocephalus or extra-axial fluid collection. Minimal patchy T2 hyperintensity in the supratentorial white matter is nonspecific but may reflect minor chronic microvascular ischemic changes. Vascular: Major vessel flow voids at the skull base are preserved. Skull and upper cervical spine: Normal marrow signal is preserved. Sinuses/Orbits: Paranasal sinuses are aerated. Orbits are unremarkable. Other: Sella is unremarkable.  Mastoid air cells are clear. IMPRESSION: No evidence of recent infarction, intracranial hemorrhage, or mass. Electronically Signed   By: Macy Mis M.D.   On: 09/03/2019 11:20   US RENAL  Result Date: 09/19/2019 CLINICAL DATA:  Left flank pain EXAM: RENAL / URINARY TRACT ULTRASOUND COMPLETE COMPARISON:  CT 09/17/2018 FINDINGS: Right Kidney: Status post nephrectomy Left Kidney: Renal measurements: 13.5 x 6.9 x 6.2 cm = volume: 3 L1 mL. Cortical echogenicity normal. No hydronephrosis. Cyst at the lower pole measuring 3.3 x 3.1 x 3.6 cm. Bladder: Appears normal for degree of bladder distention. Other: None. IMPRESSION: 1. Negative for left hydronephrosis. Cyst in the lower pole left kidney. 2. Status post right nephrectomy Electronically  Signed   By: Donavan Foil M.D.   On: 09/19/2019 17:19   DG Chest Portable 1 View  Result Date: 09/08/2019 CLINICAL DATA:  59 year old female with intermittent chest pressure for 2 weeks. EXAM: PORTABLE CHEST 1 VIEW COMPARISON:  Portable chest 09/01/2019. FINDINGS: Portable AP upright view at 2326 hours. Lung volumes and mediastinal contours remain within normal limits. Visualized tracheal air column is within normal limits. Allowing for portable technique the lungs are clear. No pneumothorax. No acute osseous abnormality identified. IMPRESSION: Stable and negative portable chest. Electronically Signed   By: Genevie Ann M.D.   On: 09/08/2019 23:38   DG Chest Portable 1 View  Result Date: 09/01/2019 CLINICAL DATA:  Chest pressure. Nausea, vomiting, diarrhea. Fever and weakness. EXAM: PORTABLE CHEST 1 VIEW COMPARISON:  None. FINDINGS: The heart size and mediastinal contours are within normal limits. Both lungs are clear. The visualized skeletal structures are unremarkable. IMPRESSION: Normal exam. Electronically Signed   By: Lorriane Shire M.D.   On: 09/01/2019 17:50   ECHOCARDIOGRAM COMPLETE  Result Date: 09/03/2019   ECHOCARDIOGRAM REPORT   Patient Name:   JOBIE PETROSYAN Date of Exam: 09/03/2019 Medical Rec #:  [id number]        Height:       65.5 in Accession #:    [id number]       Weight:       200.0 lb Date of Birth:  [birth year]        BSA:          1.99  m Patient Age:    80 years         BP:           132/74 mmHg Patient Gender: F                HR:           77 bpm. Exam Location:  Inpatient Procedure: 2D Echo Indications:    TIA 435.9 / G45.9  History:        Patient has prior history of Echocardiogram examinations.                 Arrythmias:PAC; Risk Factors:Hypertension and Diabetes.  Sonographer:    Darlina Sicilian RDCS Referring Phys: [phone] [facility]  Sonographer Comments: Unalbe to turn on left side due to hip pain IMPRESSIONS  1. Left ventricular ejection fraction, by visual estimation, is 60  to 65%. The left ventricle has normal function. There is mildly increased left ventricular hypertrophy.  2. Global right ventricle has normal systolic function.The right ventricular size is normal. No increase in right ventricular wall thickness.  3. Left atrial size was normal.  4. Right atrial size was normal.  5. The mitral valve is normal in structure. No evidence of mitral valve regurgitation.  6. The tricuspid valve is normal in structure.  7. The aortic valve is tricuspid. Aortic valve regurgitation is not visualized.  8. The pulmonic valve was not well visualized. Pulmonic valve regurgitation is not visualized.  9. The inferior vena cava is dilated in size with >50% respiratory variability, suggesting right atrial pressure of 8 mmHg. FINDINGS  Left Ventricle: Left ventricular ejection fraction, by visual estimation, is 60 to 65%. The left ventricle has normal function. The left ventricle has no regional wall motion abnormalities. The left ventricular internal cavity size was the left ventricle is normal in size. There is mildly increased left ventricular hypertrophy. Left ventricular diastolic parameters were normal. Right Ventricle: The right ventricular size is normal. No increase in right ventricular wall thickness. Global RV systolic function is has normal systolic function. Left Atrium: Left atrial size was normal in size. Right Atrium: Right atrial size was normal in size Pericardium: Trivial pericardial effusion is present. Mitral Valve: The mitral valve is normal in structure. No evidence of mitral valve regurgitation. Tricuspid Valve: The tricuspid valve is normal in structure. Tricuspid valve regurgitation is not demonstrated. Aortic Valve: The aortic valve is tricuspid. Aortic valve regurgitation is not visualized. Pulmonic Valve: The pulmonic valve was not well visualized. Pulmonic valve regurgitation is not visualized. Pulmonic regurgitation is not visualized. Aorta: The aortic root and  ascending aorta are structurally normal, with no evidence of dilitation. Venous: The inferior vena cava is dilated in size with greater than 50% respiratory variability, suggesting right atrial pressure of 8 mmHg. IAS/Shunts: The interatrial septum was not well visualized.  LEFT VENTRICLE PLAX 2D LVIDd:         4.20 cm  Diastology LVIDs:         2.70 cm  LV e' lateral:   6.53 cm/s LV PW:         1.00 cm  LV E/e' lateral: 11.0 LV IVS:        1.40 cm  LV e' medial:    6.85 cm/s LVOT diam:     2.10 cm  LV E/e' medial:  10.5 LV SV:         52 ml LV SV Index:   24.78 LVOT Area:  3.46 cm  RIGHT VENTRICLE RV S prime:     26.10 cm/s LEFT ATRIUM             Index LA diam:        3.30 cm 1.66 cm/m LA Vol (A2C):   39.4 ml 19.80 ml/m LA Vol (A4C):   53.8 ml 27.04 ml/m LA Biplane Vol: 50.0 ml 25.13 ml/m  AORTIC VALVE LVOT Vmax:   70.90 cm/s LVOT Vmean:  45.900 cm/s LVOT VTI:    0.147 m  AORTA Ao Root diam: 3.00 cm Ao Asc diam:  3.40 cm MITRAL VALVE MV Area (PHT): 3.77 cm             SHUNTS MV PHT:        58.29 msec           Systemic VTI:  0.15 m MV Decel Time: 201 msec             Systemic Diam: 2.10 cm MV E velocity: 71.60 cm/s 103 cm/s MV A velocity: 75.00 cm/s 70.3 cm/s MV E/A ratio:  0.95       1.5  Oswaldo Milian MD Electronically signed by Oswaldo Milian MD Signature Date/Time: 09/03/2019/6:10:37 PM    Final    VAS US CAROTID (at [facility] and [facility] only)  Result Date: 09/03/2019 Carotid Arterial Duplex Study Indications:       TIA. Risk Factors:      Hypertension, hyperlipidemia, Diabetes. Limitations        Today's exam was limited due to the body habitus of the                    patient. Comparison Study:  No prior study. Performing Technologist: Maudry Mayhew MHA, RDMS, RVT, RDCS  Examination Guidelines: A complete evaluation includes B-mode imaging, spectral Doppler, color Doppler, and power Doppler as needed of all accessible portions of each vessel. Bilateral testing is considered an integral part  of a complete examination. Limited examinations for reoccurring indications may be performed as noted.  Right Carotid Findings: +----------+--------+--------+--------+------------------+------------------+           PSV cm/sEDV cm/sStenosisPlaque DescriptionComments           +----------+--------+--------+--------+------------------+------------------+ CCA Prox  88      20                                intimal thickening +----------+--------+--------+--------+------------------+------------------+ CCA Distal86      25                                intimal thickening +----------+--------+--------+--------+------------------+------------------+ ICA Prox  101     22                                                   +----------+--------+--------+--------+------------------+------------------+ ICA Distal64      23                                                   +----------+--------+--------+--------+------------------+------------------+ ECA       77      17                                                   +----------+--------+--------+--------+------------------+------------------+ +----------+--------+-------+----------------+-------------------+  PSV cm/sEDV cmsDescribe        Arm Pressure (mmHG) +----------+--------+-------+----------------+-------------------+ Subclavian150            Multiphasic, WNL                    +----------+--------+-------+----------------+-------------------+ +---------+--------+--+--------+--+---------+ VertebralPSV cm/s58EDV cm/s12Antegrade +---------+--------+--+--------+--+---------+  Left Carotid Findings: +----------+--------+-------+--------+--------------------------------+--------+           PSV cm/sEDV    StenosisPlaque Description              Comments                   cm/s                                                     +----------+--------+-------+--------+--------------------------------+--------+ CCA Prox  108     17             smooth, heterogenous and                                                  calcific                                 +----------+--------+-------+--------+--------------------------------+--------+ CCA Distal91      27                                                      +----------+--------+-------+--------+--------------------------------+--------+ ICA Prox  75      24             smooth and heterogenous                  +----------+--------+-------+--------+--------------------------------+--------+ ICA Distal67      22                                                      +----------+--------+-------+--------+--------------------------------+--------+ ECA       95      25                                                      +----------+--------+-------+--------+--------------------------------+--------+ +----------+--------+--------+----------------+-------------------+           PSV cm/sEDV cm/sDescribe        Arm Pressure (mmHG) +----------+--------+--------+----------------+-------------------+ [id number]             Multiphasic, WNL                    +----------+--------+--------+----------------+-------------------+ +---------+--------+--+--------+--+---------+ VertebralPSV cm/s62EDV cm/s14Antegrade +---------+--------+--+--------+--+---------+  Summary: Right Carotid: Velocities in the right ICA are consistent with a 1-39% stenosis. Left Carotid: Velocities in the left ICA are consistent with a 1-39% stenosis. Vertebrals:  Bilateral vertebral  arteries demonstrate antegrade flow. Subclavians: Normal flow hemodynamics were seen in bilateral subclavian              arteries. *See table(s) above for measurements and observations.  Electronically signed by Antony Contras MD on 09/03/2019 at 12:14:03 PM.    Final     Microbiology: Recent  Results (from the past 240 hour(s))  SARS CORONAVIRUS 2 (TAT 6-24 HRS) Nasopharyngeal Nasopharyngeal Swab     Status: None   Collection Time: 09/18/19  4:57 PM   Specimen: Nasopharyngeal Swab  Result Value Ref Range Status   SARS Coronavirus 2 NEGATIVE NEGATIVE Final    Comment: (NOTE) SARS-CoV-2 target nucleic acids are NOT DETECTED. The SARS-CoV-2 RNA is generally detectable in upper and lower respiratory specimens during the acute phase of infection. Negative results do not preclude SARS-CoV-2 infection, do not rule out co-infections with other pathogens, and should not be used as the sole basis for treatment or other patient management decisions. Negative results must be combined with clinical observations, patient history, and epidemiological information. The expected result is Negative. Fact Sheet for Patients: SugarRoll.be Fact Sheet for Healthcare Providers: https://www.woods-mathews.com/ This test is not yet approved or cleared by the [place] FDA and  has been authorized for detection and/or diagnosis of SARS-CoV-2 by FDA under an Emergency Use Authorization (EUA). This EUA will remain  in effect (meaning this test can be used) for the duration of the COVID-19 declaration under Section 56 4(b)(1) of the Act, 21 U.S.C. section 360bbb-3(b)(1), unless the authorization is terminated or revoked sooner. Performed at [facility], [place] [address], [place], [place] [address]      Labs: Basic Metabolic Panel: Recent Labs  Lab 09/15/19 2139 09/18/19 1710 09/19/19 0529 09/20/19 0551  NA 138 134* 138 139  K 4.4 3.9 4.3 4.3  CL 104 102 108 110  CO2 22 21* 21* 21*  GLUCOSE 177* 147* 172* 127*  BUN 26* 34* 30* 24*  CREATININE 2.10* 3.24* 2.22* 1.42*  CALCIUM 9.5 8.7* 8.7* 8.5*  MG  --   --  1.7  --   PHOS  --   --  4.5  --    Liver Function Tests: Recent Labs  Lab 09/15/19 2139 09/18/19 1710 09/20/19 0551  AST 24  208* 143*  ALT 24 62* 64*  ALKPHOS 100 82 81  BILITOT 0.5 0.6 0.3  PROT 8.4* 7.1 6.4*  ALBUMIN 4.5 3.8 3.3*   Recent Labs  Lab 09/15/19 2139  LIPASE 50   No results for input(s): AMMONIA in the last 168 hours. CBC: Recent Labs  Lab 09/15/19 2139 09/18/19 1710 09/19/19 0529 09/20/19 0551  WBC 12.0* 9.1 8.5 6.4  NEUTROABS 7.9* 6.5  --  3.0  HGB 12.5 9.9* 10.5* 10.1*  HCT 37.9 29.8* 31.9* 30.8*  MCV 96.7 97.1 98.2 96.9  PLT 371 321 303 322   Cardiac Enzymes: No results for input(s): CKTOTAL, CKMB, CKMBINDEX, TROPONINI in the last 168 hours. BNP: BNP (last 3 results) No results for input(s): BNP in the last 8760 hours.  ProBNP (last 3 results) No results for input(s): PROBNP in the last 8760 hours.  CBG: Recent Labs  Lab 09/19/19 1156 09/19/19 1606 09/19/19 2016 09/20/19 0730 09/20/19 1153  GLUCAP 85 156* 86 93 123*       Signed:  Kayleen Memos, MD [facility] 09/20/2019, 12:43 PM

## 2019-09-20 NOTE — Progress Notes (Signed)
Pt discharged home in stable condition. Discharge instructions given. Scripts sent to pharmacy of choice. No immediate questions or concerns. Pt verbalized understanding. Pt discharged from unit via wheelchair.

## 2019-09-21 LAB — URINE CULTURE
Culture: <10000 — AB
Special Requests: NORMAL

## 2019-09-29 NOTE — Telephone Encounter (Signed)
Records reviewed and okay to schedule OV with Dr. Lyndel Safe.  Records @ [facility] office

## 2019-10-23 ENCOUNTER — Telehealth (INDEPENDENT_AMBULATORY_CARE_PROVIDER_SITE_OTHER): Payer: Medicare HMO | Admitting: Gastroenterology

## 2019-10-23 ENCOUNTER — Other Ambulatory Visit: Payer: Self-pay

## 2019-10-23 DIAGNOSIS — R112 Nausea with vomiting, unspecified: Secondary | ICD-10-CM | POA: Diagnosis not present

## 2019-10-23 DIAGNOSIS — R197 Diarrhea, unspecified: Secondary | ICD-10-CM | POA: Diagnosis not present

## 2019-10-23 DIAGNOSIS — R634 Abnormal weight loss: Secondary | ICD-10-CM

## 2019-10-23 NOTE — Patient Instructions (Signed)
If you are age 61 or older, your body mass index should be between 23-30. Your There is no height or weight on file to calculate BMI. If this is out of the aforementioned range listed, please consider follow up with your Primary Care Provider.  If you are age 10 or younger, your body mass index should be between 19-25. Your There is no height or weight on file to calculate BMI. If this is out of the aformentioned range listed, please consider follow up with your Primary Care Provider.   Please go to the lab at [facility] ([place] You will need to go to level "B", you do not need an appointment for this. Hours available are 7:30 am - 4:30 pm.   Continue Phenergan and Zofran.   Follow up in 2-3 weeks.   Thank you,  Dr. Jackquline Denmark

## 2019-10-23 NOTE — Progress Notes (Signed)
Chief Complaint:   Referring Provider:  Einar Pheasant, DO      ASSESSMENT AND PLAN;   #1. Diarrhea. D/d includes IBD-D, infectious causes, d/t medications, microscopic colitis, IBD, malabsorption, celiac disease. H/O C. difficile colitis and Yersinia 08/2018. Neg EGD/EUS and colonoscopy February 2018 @[facility] . Neg CT A/P x 2 08/2019.  #2. N/V. Neg EGD/EUS and colonoscopy 09/2016.  Could have diabetic gastroparesis, d/t pain medicines.  #3. Wt loss 18-20lb over 6 months  #4. Generalized abdo pain. Neg CT A/P without contrast 08/2019.  #5. Multiple comorbid conditions including DM2 with CKD3, schizophrenia, chronic pain syndrome, RCC s/p R nephrectomy, HLD, HTN, obesity.   Plan: - Please obtain previous records. - Stool studies for GI Pathogen (includes C. Diff), WBCs, culture,O&P, fecal elastase, fat and Calprotectin. - Continue phenergan and zofran prn for now - FU in 2-3 weeks in person. Can be seen in [facility].    HPI:    Amy Nunez is a 59 y.o. female  No records available at this visit.  Also patient opted for televisit as opposed to in person visit  Multiple GI complaints  -Has intermittent N/V x years, getting worse over the last 1 to 2 years.  Could not identify any definite triggering factors. She was admitted to [facility] 1/21 to 1/23 with acute on chronic renal insufficiency and uncontrolled diabetes associated with generalized abdominal pain.  Noncontrast CT Abdo/pelvis 08/2019 was unremarkable.  She was told that she has diabetic gastroparesis in the past.  She was started on Reglan previously. ?  Stopped d/t diarrhea.  -Generalized abdominal pain with abdominal bloating.  Negative extensive GI work-up.  Attributed to IBS.  -Diarrhea watery, loose stools, 4-5 BMs/day, mostly after eating.  Does have some postprandial symptoms as well.  Very disabling.  Has been admitted previously due to dehydration as well.  Occasional but not frequent nocturnal  symptoms.    H/O C. difficile colitis and yersenia enterolytica infection Jan 2020, treated with vancomycin.  Had normal CBC, CMP, lipase, CT A/P.  From Care [facility]where-underwent EGD/EUS and colonoscopy February 2018 which demonstrated erosive gastritis and internal hemorrhoids.   No dentures/teeth Can't chew food well  No heartburn, regurgitation, odynophagia or dysphagia.  No constipation. No melena or hematochezia. No unintentional weight loss.    Additional past medical history:  She has a past medical history of schizophrenia, diabetes, history of renal cell carcinoma status post nephrectomy, history of ovarian cancer status post right hysterectomy with chronic arthralgias and pain    . Enteritis, Yersinia enterocolitica 09/25/2018  . Gross hematuria 09/19/2018  . C. difficile colitis 09/16/2018  . Intractable ophthalmoplegic migraine 09/14/2018  . Type 2 diabetes mellitus with ketoacidosis without coma, with long-term current use of insulin (*) 09/14/2018  . Dysuria 09/03/2018  . UTI (urinary tract infection) - possible 09/03/2018  . Intractable nausea and vomiting 09/03/2018  . Class 2 severe obesity due to excess calories with serious comorbidity and body mass index (BMI) of 36.0 to 36.9 in adult (*) 05/07/2018  . Combined form of age-related cataract, both eyes 12/24/2017  . Myopia with presbyopia of both eyes 12/24/2017  . Hypomagnesemia 05/17/2017  . Hyponatremia 05/17/2017  . Hyperglycemia 05/17/2017  . Leukocytosis 05/17/2017  . Diabetic nephropathy associated with type 2 diabetes mellitus (*) 01/25/2017  . Hyperlipidemia associated with type 2 diabetes mellitus (*) 01/25/2017  . Morbid obesity with BMI of 40.0-44.9, adult (*) 01/25/2017  . Gastric bezoar felt to be due to diabetic  gastroparesis, presenting as L sided chest wall/abdominl pain 10/20/2016  . Pulmonary nodules (2 RUL 28mm in size; 35mm nodule LUL, 51mm nodule LUL) - needs repeat CT chest 12 months  10/17/2016  . Tobacco dependence 10/17/2016  . Essential hypertension 06/19/2016  . Acute cystitis without hematuria 06/19/2016  . DVT (deep venous thrombosis), dx 03/2016 at [facility] 04/17/2016  . Overdose of analgesic 04/16/2016  . Chronic pain syndrome 04/16/2016  . Acute on chronic renal insufficiency 04/16/2016  . History of migraine headaches 11/21/2015  . Status post right nephrectomy 1999 11/21/2015  . Schizophrenia (*) 11/16/2015  . Type 2 diabetes mellitus with hyperglycemia, with long-term current use of insulin (*) 11/15/2015   Past Medical History:  Diagnosis Date  . Blood transfusion without reported diagnosis   . Cancer [facility])    renal cancer  . Diabetes mellitus without complication ([place])   . Hypertension   . IBS (irritable bowel syndrome)     Past Surgical History:  Procedure Laterality Date  . ABDOMINAL HYSTERECTOMY    . ANKLE SURGERY    . COLONOSCOPY  2018   [facility]  . ESOPHAGOGASTRODUODENOSCOPY  10/15/2016   [facility]  . NEPHRECTOMY Right     Family History  Problem Relation Age of Onset  . Colon cancer Neg Hx   . Esophageal cancer Neg Hx     Social History   Tobacco Use  . Smoking status: Former Research scientist (life sciences)  . Smokeless tobacco: Never Used  Substance Use Topics  . Alcohol use: Not Currently  . Drug use: Never    Current Outpatient Medications  Medication Sig Dispense Refill  . amLODipine (NORVASC) 5 MG tablet Take 1 tablet (5 mg total) by mouth daily. 30 tablet 0  . atorvastatin (LIPITOR) 80 MG tablet Take 80 mg by mouth daily.    Marland Kitchen gabapentin (NEURONTIN) 800 MG tablet Take 1 tablet (800 mg total) by mouth 2 (two) times daily. (Patient taking differently: Take 800 mg by mouth 2 (two) times daily as needed (pain). )    . HYDROcodone-acetaminophen (NORCO) 10-325 MG tablet Take 1 tablet by mouth every 6 (six) hours as needed (pain).     Marland Kitchen LANTUS SOLOSTAR 100 UNIT/ML Solostar Pen Inject 5 Units into the skin at bedtime. 15 mL 0  . lisinopril (ZESTRIL)  40 MG tablet Take 40 mg by mouth daily.    Marland Kitchen morphine (MS CONTIN) 30 MG 12 hr tablet Take 30 mg by mouth every 12 (twelve) hours.    . promethazine (PHENERGAN) 25 MG tablet Take 25 mg by mouth daily.    . QUEtiapine (SEROQUEL) 200 MG tablet Take 1-2 tablets by mouth See admin instructions. Take 1 tablet in the morning and 2 tablets at night.    Marland Kitchen spironolactone (ALDACTONE) 50 MG tablet     . tiZANidine (ZANAFLEX) 4 MG tablet Take 4 mg by mouth every 8 (eight) hours as needed for muscle spasms.      No current facility-administered medications for this visit.    Allergies  Allergen Reactions  . Penicillins Anaphylaxis and Rash    Other reaction(s): Unknown  . Ciprofloxacin Hives  . Red Dye Diarrhea    Other reaction(s): Unknown    Review of Systems:  Constitutional: Denies fever, chills, diaphoresis, appetite change and fatigue.  HEENT: Denies photophobia, eye pain, redness, hearing loss, ear pain, congestion, sore throat, rhinorrhea, sneezing, mouth sores, neck pain, neck stiffness and tinnitus.   Respiratory: Denies SOB, DOE, cough, chest tightness,  and wheezing.  Cardiovascular: Denies chest pain, palpitations and leg swelling.  Genitourinary: Denies dysuria, urgency, frequency, hematuria, flank pain and difficulty urinating.  Musculoskeletal: has  myalgias, back pain, joint swelling, arthralgias and gait problem.  Skin: No rash.  Neurological: Denies dizziness, seizures, syncope, weakness, light-headedness, numbness and headaches.  Hematological: Denies adenopathy. Easy bruising, personal or family bleeding history  Psychiatric/Behavioral: Has anxiety or depression     Physical Exam:    There were no vitals taken for this visit. Wt Readings from Last 3 Encounters:  09/19/19 210 lb 4.8 oz (95.4 kg)  09/15/19 205 lb 0.4 oz (93 kg)  09/08/19 207 lb (93.9 kg)   Televisit  Data Reviewed: I have personally reviewed following labs and imaging studies  CBC: CBC Latest Ref  Rng & Units 09/20/2019 09/19/2019 09/18/2019  WBC 4.0 - 10.5 K/uL 6.4 8.5 9.1  Hemoglobin 12.0 - 15.0 g/dL 10.1(L) 10.5(L) 9.9(L)  Hematocrit 36.0 - 46.0 % 30.8(L) 31.9(L) 29.8(L)  Platelets 150 - 400 K/uL 322 303 321    CMP: CMP Latest Ref Rng & Units 09/20/2019 09/19/2019 09/18/2019  Glucose 70 - 99 mg/dL 127(H) 172(H) 147(H)  BUN 6 - 20 mg/dL 24(H) 30(H) 34(H)  Creatinine 0.44 - 1.00 mg/dL 1.42(H) 2.22(H) 3.24(H)  Sodium 135 - 145 mmol/L 139 138 134(L)  Potassium 3.5 - 5.1 mmol/L 4.3 4.3 3.9  Chloride 98 - 111 mmol/L 110 108 102  CO2 22 - 32 mmol/L 21(L) 21(L) 21(L)  Calcium 8.9 - 10.3 mg/dL 8.5(L) 8.7(L) 8.7(L)  Total Protein 6.5 - 8.1 g/dL 6.4(L) - 7.1  Total Bilirubin 0.3 - 1.2 mg/dL 0.3 - 0.6  Alkaline Phos 38 - 126 U/L 81 - 82  AST 15 - 41 U/L 143(H) - 208(H)  ALT 0 - 44 U/L 64(H) - 62(H)  I connected with  Amy Nunez on 10/23/19 by a video enabled telemedicine application and verified that I am speaking with the correct person using two identifiers.   I discussed the limitations of evaluation and management by telemedicine. The patient expressed understanding and agreed to proceed.  Extensive data reviewed.   Carmell Austria, MD 10/23/2019, 10:29 AM  Cc: Einar Pheasant, DO

## 2019-11-03 ENCOUNTER — Ambulatory Visit: Payer: Self-pay | Admitting: General Surgery

## 2019-11-03 ENCOUNTER — Encounter (HOSPITAL_BASED_OUTPATIENT_CLINIC_OR_DEPARTMENT_OTHER): Payer: Self-pay | Admitting: *Deleted

## 2019-11-03 ENCOUNTER — Emergency Department (HOSPITAL_BASED_OUTPATIENT_CLINIC_OR_DEPARTMENT_OTHER): Payer: Medicare HMO

## 2019-11-03 ENCOUNTER — Inpatient Hospital Stay (HOSPITAL_BASED_OUTPATIENT_CLINIC_OR_DEPARTMENT_OTHER)
Admission: EM | Admit: 2019-11-03 | Discharge: 2019-11-08 | DRG: 342 | Disposition: A | Discharge status: Home / Self Care | Payer: Medicare HMO | Attending: Internal Medicine | Admitting: Internal Medicine

## 2019-11-03 ENCOUNTER — Other Ambulatory Visit: Payer: Self-pay

## 2019-11-03 DIAGNOSIS — N1832 Chronic kidney disease, stage 3b: Secondary | ICD-10-CM | POA: Diagnosis present

## 2019-11-03 DIAGNOSIS — E1122 Type 2 diabetes mellitus with diabetic chronic kidney disease: Secondary | ICD-10-CM | POA: Diagnosis present

## 2019-11-03 DIAGNOSIS — Z79899 Other long term (current) drug therapy: Secondary | ICD-10-CM | POA: Diagnosis not present

## 2019-11-03 DIAGNOSIS — Z905 Acquired absence of kidney: Secondary | ICD-10-CM

## 2019-11-03 DIAGNOSIS — F209 Schizophrenia, unspecified: Secondary | ICD-10-CM | POA: Diagnosis present

## 2019-11-03 DIAGNOSIS — G8929 Other chronic pain: Secondary | ICD-10-CM | POA: Diagnosis present

## 2019-11-03 DIAGNOSIS — Z880 Allergy status to penicillin: Secondary | ICD-10-CM | POA: Diagnosis not present

## 2019-11-03 DIAGNOSIS — Z85528 Personal history of other malignant neoplasm of kidney: Secondary | ICD-10-CM

## 2019-11-03 DIAGNOSIS — N179 Acute kidney failure, unspecified: Secondary | ICD-10-CM | POA: Diagnosis not present

## 2019-11-03 DIAGNOSIS — K37 Unspecified appendicitis: Secondary | ICD-10-CM | POA: Diagnosis present

## 2019-11-03 DIAGNOSIS — R112 Nausea with vomiting, unspecified: Secondary | ICD-10-CM | POA: Diagnosis not present

## 2019-11-03 DIAGNOSIS — E1165 Type 2 diabetes mellitus with hyperglycemia: Secondary | ICD-10-CM | POA: Diagnosis present

## 2019-11-03 DIAGNOSIS — I10 Essential (primary) hypertension: Secondary | ICD-10-CM | POA: Diagnosis present

## 2019-11-03 DIAGNOSIS — Z87891 Personal history of nicotine dependence: Secondary | ICD-10-CM | POA: Diagnosis not present

## 2019-11-03 DIAGNOSIS — I959 Hypotension, unspecified: Secondary | ICD-10-CM | POA: Diagnosis not present

## 2019-11-03 DIAGNOSIS — K660 Peritoneal adhesions (postprocedural) (postinfection): Secondary | ICD-10-CM | POA: Diagnosis present

## 2019-11-03 DIAGNOSIS — K589 Irritable bowel syndrome without diarrhea: Secondary | ICD-10-CM | POA: Diagnosis present

## 2019-11-03 DIAGNOSIS — R7989 Other specified abnormal findings of blood chemistry: Secondary | ICD-10-CM

## 2019-11-03 DIAGNOSIS — R109 Unspecified abdominal pain: Secondary | ICD-10-CM | POA: Diagnosis not present

## 2019-11-03 DIAGNOSIS — Z20822 Contact with and (suspected) exposure to covid-19: Secondary | ICD-10-CM | POA: Diagnosis present

## 2019-11-03 DIAGNOSIS — I129 Hypertensive chronic kidney disease with stage 1 through stage 4 chronic kidney disease, or unspecified chronic kidney disease: Secondary | ICD-10-CM | POA: Diagnosis not present

## 2019-11-03 DIAGNOSIS — K3580 Unspecified acute appendicitis: Secondary | ICD-10-CM | POA: Diagnosis present

## 2019-11-03 DIAGNOSIS — E119 Type 2 diabetes mellitus without complications: Secondary | ICD-10-CM

## 2019-11-03 DIAGNOSIS — N189 Chronic kidney disease, unspecified: Secondary | ICD-10-CM | POA: Diagnosis present

## 2019-11-03 LAB — DIFFERENTIAL
Abs Immature Granulocytes: 0.07 10*3/uL (ref 0.00–0.07)
Basophils Absolute: 0 10*3/uL (ref 0.0–0.1)
Basophils Relative: 0 %
Eosinophils Absolute: 0 10*3/uL (ref 0.0–0.5)
Eosinophils Relative: 0 %
Immature Granulocytes: 1 %
Lymphocytes Relative: 8 %
Lymphs Abs: 1.1 10*3/uL (ref 0.7–4.0)
Monocytes Absolute: 0.4 10*3/uL (ref 0.1–1.0)
Monocytes Relative: 3 %
Neutro Abs: 12.5 10*3/uL — ABNORMAL HIGH (ref 1.7–7.7)
Neutrophils Relative %: 88 %

## 2019-11-03 LAB — COMPREHENSIVE METABOLIC PANEL
ALT: 40 U/L (ref 0–44)
AST: 32 U/L (ref 15–41)
Albumin: 4.3 g/dL (ref 3.5–5.0)
Alkaline Phosphatase: 102 U/L (ref 38–126)
Anion gap: 14 (ref 5–15)
BUN: 24 mg/dL — ABNORMAL HIGH (ref 6–20)
CO2: 18 mmol/L — ABNORMAL LOW (ref 22–32)
Calcium: 9.5 mg/dL (ref 8.9–10.3)
Chloride: 100 mmol/L (ref 98–111)
Creatinine, Ser: 1.53 mg/dL — ABNORMAL HIGH (ref 0.44–1.00)
GFR calc Af Amer: 42 mL/min — ABNORMAL LOW (ref >60)
GFR calc non Af Amer: 37 mL/min — ABNORMAL LOW (ref >60)
Glucose, Bld: 306 mg/dL — ABNORMAL HIGH (ref 70–99)
Potassium: 4.1 mmol/L (ref 3.5–5.1)
Sodium: 132 mmol/L — ABNORMAL LOW (ref 135–145)
Total Bilirubin: 0.9 mg/dL (ref 0.3–1.2)
Total Protein: 8.1 g/dL (ref 6.5–8.1)

## 2019-11-03 LAB — URINALYSIS, ROUTINE W REFLEX MICROSCOPIC
Bilirubin Urine: NEGATIVE
Glucose, UA: 250 mg/dL — AB
Ketones, ur: 15 mg/dL — AB
Leukocytes,Ua: NEGATIVE
Nitrite: NEGATIVE
Protein, ur: NEGATIVE mg/dL
Specific Gravity, Urine: 1.015 (ref 1.005–1.030)
pH: 6 (ref 5.0–8.0)

## 2019-11-03 LAB — CBC
HCT: 34.9 % — ABNORMAL LOW (ref 36.0–46.0)
Hemoglobin: 12 g/dL (ref 12.0–15.0)
MCH: 31.3 pg (ref 26.0–34.0)
MCHC: 34.4 g/dL (ref 30.0–36.0)
MCV: 90.9 fL (ref 80.0–100.0)
Platelets: 430 10*3/uL — ABNORMAL HIGH (ref 150–400)
RBC: 3.84 MIL/uL — ABNORMAL LOW (ref 3.87–5.11)
RDW: 12.3 % (ref 11.5–15.5)
WBC: 14 10*3/uL — ABNORMAL HIGH (ref 4.0–10.5)
nRBC: 0 % (ref 0.0–0.2)

## 2019-11-03 LAB — RAPID URINE DRUG SCREEN, HOSP PERFORMED
Amphetamines: NOT DETECTED
Barbiturates: NOT DETECTED
Benzodiazepines: NOT DETECTED
Cocaine: NOT DETECTED
Opiates: POSITIVE — AB
Tetrahydrocannabinol: NOT DETECTED

## 2019-11-03 LAB — RESPIRATORY PANEL BY RT PCR (FLU A&B, COVID)
Influenza A by PCR: NEGATIVE
Influenza B by PCR: NEGATIVE
SARS Coronavirus 2 by RT PCR: NEGATIVE

## 2019-11-03 LAB — URINALYSIS, MICROSCOPIC (REFLEX)

## 2019-11-03 LAB — LIPASE, BLOOD: Lipase: 63 U/L — ABNORMAL HIGH (ref 11–51)

## 2019-11-03 LAB — CBG MONITORING, ED: Glucose-Capillary: 275 mg/dL — ABNORMAL HIGH (ref 70–99)

## 2019-11-03 MED ORDER — SODIUM CHLORIDE 0.9 % IV BOLUS
1000.0000 mL | Freq: Once | INTRAVENOUS | Status: AC
  Administered 2019-11-03: 1000 mL via INTRAVENOUS

## 2019-11-03 MED ORDER — ONDANSETRON 4 MG PO TBDP
4.0000 mg | ORAL_TABLET | Freq: Once | ORAL | Status: AC | PRN
  Administered 2019-11-03: 4 mg via ORAL
  Filled 2019-11-03: qty 1

## 2019-11-03 MED ORDER — HYDROMORPHONE HCL 1 MG/ML IJ SOLN: 0.5000 mg | INTRAMUSCULAR | Status: DC | PRN

## 2019-11-03 MED ORDER — IOHEXOL 300 MG/ML  SOLN
100.0000 mL | Freq: Once | INTRAMUSCULAR | Status: AC
  Administered 2019-11-03: 18:00:00 50 mL via INTRAVENOUS

## 2019-11-03 MED ORDER — ONDANSETRON HCL 4 MG/2ML IJ SOLN
4.0000 mg | Freq: Four times a day (QID) | INTRAMUSCULAR | Status: DC | PRN
  Administered 2019-11-03: 4 mg via INTRAVENOUS
  Filled 2019-11-03 (×3): qty 2

## 2019-11-03 MED ORDER — DIPHENHYDRAMINE HCL 50 MG/ML IJ SOLN: 12.5000 mg | Freq: Four times a day (QID) | INTRAMUSCULAR | Status: DC | PRN

## 2019-11-03 MED ORDER — SODIUM CHLORIDE 0.9 % IV SOLN
2.0000 g | Freq: Once | INTRAVENOUS | Status: AC
  Administered 2019-11-03: 2 g via INTRAVENOUS
  Filled 2019-11-03: qty 2

## 2019-11-03 MED ORDER — DIPHENHYDRAMINE HCL 12.5 MG/5ML PO ELIX: 12.5000 mg | ORAL_SOLUTION | Freq: Four times a day (QID) | ORAL | Status: DC | PRN

## 2019-11-03 MED ORDER — METOPROLOL TARTRATE 5 MG/5ML IV SOLN
5.0000 mg | Freq: Four times a day (QID) | INTRAVENOUS | Status: DC | PRN
  Administered 2019-11-03: 5 mg via INTRAVENOUS
  Filled 2019-11-03 (×2): qty 5

## 2019-11-03 MED ORDER — ONDANSETRON 4 MG PO TBDP: 4.0000 mg | ORAL_TABLET | Freq: Four times a day (QID) | ORAL | Status: DC | PRN

## 2019-11-03 MED ORDER — ENOXAPARIN SODIUM 30 MG/0.3ML ~~LOC~~ SOLN
30.0000 mg | Freq: Every day | SUBCUTANEOUS | Status: DC
  Administered 2019-11-03: 30 mg via SUBCUTANEOUS
  Filled 2019-11-03: qty 0.3

## 2019-11-03 MED ORDER — HYDROMORPHONE HCL 1 MG/ML IJ SOLN
1.0000 mg | Freq: Once | INTRAMUSCULAR | Status: AC
  Administered 2019-11-03: 17:00:00 1 mg via INTRAVENOUS
  Filled 2019-11-03: qty 1

## 2019-11-03 MED ORDER — METRONIDAZOLE IN NACL 5-0.79 MG/ML-% IV SOLN
500.0000 mg | Freq: Three times a day (TID) | INTRAVENOUS | Status: DC
  Administered 2019-11-04: 500 mg via INTRAVENOUS
  Filled 2019-11-03 (×2): qty 100

## 2019-11-03 MED ORDER — LORAZEPAM 2 MG/ML IJ SOLN
1.0000 mg | Freq: Once | INTRAMUSCULAR | Status: AC
  Administered 2019-11-03: 17:00:00 1 mg via INTRAVENOUS
  Filled 2019-11-03: qty 1

## 2019-11-03 MED ORDER — SODIUM CHLORIDE 0.9 % IV SOLN
2.0000 g | Freq: Two times a day (BID) | INTRAVENOUS | Status: DC
  Administered 2019-11-04: 2 g via INTRAVENOUS
  Filled 2019-11-03 (×3): qty 2

## 2019-11-03 MED ORDER — PROMETHAZINE HCL 25 MG/ML IJ SOLN
25.0000 mg | Freq: Once | INTRAMUSCULAR | Status: AC
  Administered 2019-11-03: 16:00:00 12.5 mg via INTRAVENOUS
  Filled 2019-11-03: qty 1

## 2019-11-03 MED ORDER — METRONIDAZOLE IN NACL 5-0.79 MG/ML-% IV SOLN
500.0000 mg | Freq: Once | INTRAVENOUS | Status: AC
  Administered 2019-11-03: 500 mg via INTRAVENOUS
  Filled 2019-11-03: qty 100

## 2019-11-03 MED ORDER — DROPERIDOL 2.5 MG/ML IJ SOLN
1.2500 mg | Freq: Once | INTRAMUSCULAR | Status: AC
  Administered 2019-11-03: 1.25 mg via INTRAVENOUS
  Filled 2019-11-03: qty 2

## 2019-11-03 MED ORDER — HYDROMORPHONE HCL 1 MG/ML IJ SOLN
0.5000 mg | Freq: Once | INTRAMUSCULAR | Status: AC
  Administered 2019-11-03: 0.5 mg via INTRAVENOUS
  Filled 2019-11-03: qty 1

## 2019-11-03 MED ORDER — SODIUM CHLORIDE 0.9 % IV SOLN: INTRAVENOUS | Status: AC

## 2019-11-03 MED ORDER — MORPHINE SULFATE (PF) 4 MG/ML IV SOLN
4.0000 mg | Freq: Once | INTRAVENOUS | Status: AC
  Administered 2019-11-03: 4 mg via INTRAVENOUS
  Filled 2019-11-03: qty 1

## 2019-11-03 NOTE — ED Triage Notes (Signed)
Chronic abdominal pain and vomiting. She had an appointment with her MD but states she could not get there. Gagging and dry heaves at triage.

## 2019-11-03 NOTE — ED Provider Notes (Signed)
[facility] EMERGENCY DEPARTMENT Provider Note   CSN: [id number] Arrival date & time: 11/03/19  1344     History Chief Complaint  Patient presents with   Abdominal Pain   Emesis    Amy Nunez is a 59 y.o. female.  HPI      Amy Nunez is a 59 y.o. female, with a history of DM, HTN, IBS, presenting to the ED with nausea and vomiting beginning this morning.  Accompanied by abdominal pain, gives vague description, generalized, will not answer any further questions about the abdominal pain other than telling me "it hurts."  She has had intermittent diarrhea today as well.  She tells me she has had this constellation of symptoms in the past. Last food was around 9 AM this morning. Denies fever/chills, chest pain, shortness of breath, cough, urinary symptoms, hematochezia/melena, or any other complaints.   Past Medical History:  Diagnosis Date   Blood transfusion without reported diagnosis    Cancer ([place])    renal cancer   Diabetes mellitus without complication ([place])    Hypertension    IBS (irritable bowel syndrome)     Patient Active Problem List   Diagnosis Date Noted   AKI (acute kidney injury) ([place]) 09/19/2019   Acute kidney injury superimposed on chronic kidney disease ([place]) 09/18/2019   Abdominal pain 09/03/2019   Diabetes ([place]) 09/03/2019   Benign essential HTN 09/03/2019   Irritable bowel syndrome (IBS) 09/03/2019   Left-sided weakness 09/01/2019    Past Surgical History:  Procedure Laterality Date   ABDOMINAL HYSTERECTOMY     ANKLE SURGERY     COLONOSCOPY  2018   [facility]   ESOPHAGOGASTRODUODENOSCOPY  10/15/2016   [facility]   NEPHRECTOMY Right      OB History   No obstetric history on file.     Family History  Problem Relation Age of Onset   Colon cancer Neg Hx    Esophageal cancer Neg Hx     Social History   Tobacco Use   Smoking status: Former Smoker   Smokeless tobacco: Never Used  Substance Use  Topics   Alcohol use: Not Currently   Drug use: Never    Home Medications Prior to Admission medications   Medication Sig Start Date End Date Taking? Authorizing Provider  amLODipine (NORVASC) 5 MG tablet Take 1 tablet (5 mg total) by mouth daily. 09/20/19 10/23/19  Kayleen Memos, DO  atorvastatin (LIPITOR) 80 MG tablet Take 80 mg by mouth daily.    [provider]  gabapentin (NEURONTIN) 800 MG tablet Take 1 tablet (800 mg total) by mouth 2 (two) times daily. Patient taking differently: Take 800 mg by mouth 2 (two) times daily as needed (pain).  09/04/19   Mariel Aloe, MD  HYDROcodone-acetaminophen (NORCO) 10-325 MG tablet Take 1 tablet by mouth every 6 (six) hours as needed (pain).  08/28/19   [provider]  LANTUS SOLOSTAR 100 UNIT/ML Solostar Pen Inject 5 Units into the skin at bedtime. 09/20/19   Kayleen Memos, DO  lisinopril (ZESTRIL) 40 MG tablet Take 40 mg by mouth daily.    [provider]  morphine (MS CONTIN) 30 MG 12 hr tablet Take 30 mg by mouth every 12 (twelve) hours. 08/28/19   [provider]  promethazine (PHENERGAN) 25 MG tablet Take 25 mg by mouth daily.    [provider]  QUEtiapine (SEROQUEL) 200 MG tablet Take 1-2 tablets by mouth See admin instructions. Take 1 tablet in  the morning and 2 tablets at night. 07/17/19   [provider]  spironolactone (ALDACTONE) 50 MG tablet  10/07/19   [provider]  tiZANidine (ZANAFLEX) 4 MG tablet Take 4 mg by mouth every 8 (eight) hours as needed for muscle spasms.  08/25/19   [provider]    Allergies    Penicillins, Ciprofloxacin, and Red dye  Review of Systems   Review of Systems  Constitutional: Negative for chills, diaphoresis and fever.  Respiratory: Negative for cough and shortness of breath.   Cardiovascular: Negative for chest pain and leg swelling.  Gastrointestinal: Positive for abdominal pain, diarrhea, nausea and vomiting. Negative for  blood in stool.  Neurological: Negative for dizziness, syncope and weakness.  All other systems reviewed and are negative.   Physical Exam Updated Vital Signs BP (!) 142/101    Pulse (!) 108    Temp 98.3 F (36.8 C) (Oral)    Resp 20    Ht 5' 5.5" (1.664 m)    Wt 95.4 kg    SpO2 94%    BMI 34.47 kg/m   Physical Exam Vitals and nursing note reviewed.  Constitutional:      General: She is not in acute distress.    Appearance: She is well-developed. She is obese. She is not diaphoretic.  HENT:     Head: Normocephalic and atraumatic.     Mouth/Throat:     Mouth: Mucous membranes are moist.     Pharynx: Oropharynx is clear.  Eyes:     Conjunctiva/sclera: Conjunctivae normal.  Cardiovascular:     Rate and Rhythm: Normal rate and regular rhythm.     Pulses: Normal pulses.          Radial pulses are 2+ on the right side and 2+ on the left side.       Posterior tibial pulses are 2+ on the right side and 2+ on the left side.     Heart sounds: Normal heart sounds.     Comments: Tactile temperature in the extremities appropriate and equal bilaterally. Pulmonary:     Effort: Pulmonary effort is normal. No respiratory distress.     Breath sounds: Normal breath sounds.  Abdominal:     Palpations: Abdomen is soft.     Tenderness: There is generalized abdominal tenderness. There is no guarding.  Musculoskeletal:     Cervical back: Neck supple.     Right lower leg: No edema.     Left lower leg: No edema.  Lymphadenopathy:     Cervical: No cervical adenopathy.  Skin:    General: Skin is warm and dry.  Neurological:     Mental Status: She is alert.  Psychiatric:        Mood and Affect: Mood and affect normal.        Speech: Speech normal.        Behavior: Behavior normal.     ED Results / Procedures / Treatments   Labs (all labs ordered are listed, but only abnormal results are displayed) Labs Reviewed  CBC - Abnormal; Notable for the following components:      Result Value    WBC 14.0 (*)    RBC 3.84 (*)    HCT 34.9 (*)    Platelets 430 (*)    All other components within normal limits  COMPREHENSIVE METABOLIC PANEL - Abnormal; Notable for the following components:   Sodium 132 (*)    CO2 18 (*)    Glucose, Bld 306 (*)  BUN 24 (*)    Creatinine, Ser 1.53 (*)    GFR calc non Af Amer 37 (*)    GFR calc Af Amer 42 (*)    All other components within normal limits  LIPASE, BLOOD - Abnormal; Notable for the following components:   Lipase 63 (*)    All other components within normal limits  URINALYSIS, ROUTINE W REFLEX MICROSCOPIC - Abnormal; Notable for the following components:   Glucose, UA 250 (*)    Hgb urine dipstick SMALL (*)    Ketones, ur 15 (*)    All other components within normal limits  RAPID URINE DRUG SCREEN, HOSP PERFORMED - Abnormal; Notable for the following components:   Opiates POSITIVE (*)    All other components within normal limits  DIFFERENTIAL - Abnormal; Notable for the following components:   Neutro Abs 12.5 (*)    All other components within normal limits  URINALYSIS, MICROSCOPIC (REFLEX) - Abnormal; Notable for the following components:   Bacteria, UA RARE (*)    All other components within normal limits  CBG MONITORING, ED - Abnormal; Notable for the following components:   Glucose-Capillary 275 (*)    All other components within normal limits  RESPIRATORY PANEL BY RT PCR (FLU A&B, COVID)  URINE CULTURE    EKG EKG Interpretation  Date/Time:  Monday November 03 2019 14:18:44 EST Ventricular Rate:  105 PR Interval:  162 QRS Duration: 66 QT Interval:  338 QTC Calculation: 446 R Axis:     Text Interpretation: Sinus tachycardia with Premature atrial complexes Possible Anterior infarct , age undetermined Abnormal ECG similar to Jan 2021 Confirmed by Sherwood Gambler [id number]) on 11/03/2019 3:10:00 PM   Radiology CT ABDOMEN PELVIS W CONTRAST  Result Date: 11/03/2019 CLINICAL DATA:  Abdominal pain and neutropenia. EXAM: CT  ABDOMEN AND PELVIS WITH CONTRAST TECHNIQUE: Multidetector CT imaging of the abdomen and pelvis was performed using the standard protocol following bolus administration of intravenous contrast. Automatic exposure control utilized. CONTRAST:  81mL OMNIPAQUE IOHEXOL 300 MG/ML  SOLN COMPARISON:  September 18, 2019. FINDINGS: Lower chest: Mild cardiomegaly with mild right main and left circumflex coronary calcification. Central pulmonary vascular congestion without pulmonary edema or pleural effusion. Thin linear atelectasis in the lung bases. Moderately sized hiatal hernia. Hepatobiliary: Fatty infiltration of the liver. Normal hepatic size and normal smooth hepatic contours. Decompressed and otherwise normal gallbladder. Normal biliary tree. Pancreas: Mild atrophy. Patient motion degrades image quality in this region and subtle inflammatory changes not excluded. There is no apparent dilatation of the main pancreatic duct or sequela of chronic pancreatitis. Spleen: Small spleen measuring 7.6 x 4.6 cm in the axial plane. Normal splenic contours. Adrenals/Urinary Tract: Normal bilateral adrenal glands. Right nephrectomy with nonspecific asymmetrically increased size of the right psoas along the course of the right ureter and surgical clips that could be metastatic invasion of the musculature versus a benign etiology. Normal size and cortical thickness of the left kidney with a complicated 2.5 cm lower pole cortical cyst with focal aches shell type calcification on the lateral periphery and 13 Hounsfield unit density internally. Stomach/Bowel: Abnormally dilated fluid field retrocecal appendix measuring 10 mm diameter with adjacent mild inflammatory change medial to the right iliac vessels on axial series 2, image 58 and sagittal series 6, image 49. Nonobstructed. Diverticulosis in the proximal and distal colon with an acute mild right lower quadrant diverticulitis involving the ascending colon and cecum. No associated  perforation or abscess. Adhesion formation of the duodenal C loop  to the adjacent small bowel loops and right psoas in the nephrectomy bed. Vascular/Lymphatic: Small right lower quadrant mesenteric lymph nodes, likely reactive. Thoracoabdominal aorta and bilateral iliac calcified atherosclerosis without an abdominal aortic aneurysm. 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 Reproductive: Hysterectomy. No adnexal cyst or mass. Other: No free intraperitoneal fluid or air. Musculoskeletal: Atrophy and diastasis of the abdominal rectus musculature. Moderate skeletal degenerative change and mild bone demineralization. Critical Value/emergent results were called by telephone at the time of interpretation on 11/03/2019 at 7:22 pm to provider [facility] , who verbally acknowledged these results. IMPRESSION: 1. Acute uncomplicated appendicitis and mild uncomplicated diverticulitis of the ascending colon. 2. Right nephrectomy with nonspecific asymmetrically increased size of the right psoas along the course of the right ureter and surgical clips that could be metastatic invasion of the musculature versus a benign etiology. Additional nonemergent outpatient MR abdomen pelvis with intravenous contrast recommended to assess for any suspicious muscle enhancement. Correlation with surgical history may be useful. 3. Adhesion formation of the duodenal C loop to the adjacent small bowel loops and right psoas in the nephrectomy bed without bowel obstruction. 4. A complicated left renal midpole cyst. 5. Fatty infiltration of the liver. 6. Moderately sized hiatal hernia. 7. Mild cardiomegaly with mild right main and left circumflex coronary artery disease. Aortic Atherosclerosis (ICD10-I70.0). Electronically Signed   By: Revonda Humphrey   On: 11/03/2019 19:23    Procedures Procedures (including critical care time)  Medications Ordered in ED Medications  ceFEPIme (MAXIPIME) 2 g in  sodium chloride 0.9 % 100 mL IVPB (0 g Intravenous Stopped 11/03/19 2048)    And  metroNIDAZOLE (FLAGYL) IVPB 500 mg (500 mg Intravenous New Bag/Given 11/03/19 2049)  ondansetron (ZOFRAN-ODT) disintegrating tablet 4 mg (4 mg Oral Given 11/03/19 1406)  promethazine (PHENERGAN) injection 25 mg (12.5 mg Intravenous Given 11/03/19 1538)  sodium chloride 0.9 % bolus 1,000 mL (0 mLs Intravenous Stopped 11/03/19 1656)  morphine 4 MG/ML injection 4 mg (4 mg Intravenous Given 11/03/19 1533)  LORazepam (ATIVAN) injection 1 mg (1 mg Intravenous Given 11/03/19 1712)  HYDROmorphone (DILAUDID) injection 1 mg (1 mg Intravenous Given 11/03/19 1713)  droperidol (INAPSINE) 2.5 MG/ML injection 1.25 mg (1.25 mg Intravenous Given 11/03/19 1832)  iohexol (OMNIPAQUE) 300 MG/ML solution 100 mL (50 mLs Intravenous Contrast Given 11/03/19 1821)  sodium chloride 0.9 % bolus 1,000 mL (0 mLs Intravenous Stopped 11/03/19 2010)  HYDROmorphone (DILAUDID) injection 0.5 mg (0.5 mg Intravenous Given 11/03/19 2000)    ED Course  I have reviewed the triage vital signs and the nursing notes.  Pertinent labs & imaging results that were available during my care of the patient were reviewed by me and considered in my medical decision making (see chart for details).  Clinical Course as of Nov 03 2054  Mon Nov 03, 2019  1807 Patient states her pain is 7/10. Nausea still present. Said she has had a rest from vomiting, but then vomited while I was in the room.   [SJ]  1932 Spoke with Corene Cornea, pharmacist, to discuss antibiotic options.  He states even with patient's listed allergy to penicillin, she should do fine with cefepime.   [SJ]  1935 Discussed CT findings, appendicitis as well as incidental findings, with the patient at the bedside.  Her pain and nausea are now well controlled.   [SJ]  1943 Spoke with Dr. Marcello Moores, general surgeon.  Requests after Covid test results return, that we put  in a bed request for MedSurg bed at [facility] under Dr. Manon Hilding  name.   [SJ]    Clinical Course User Index [SJ] Machaela Caterino, Helane Gunther, PA-C   MDM Rules/Calculators/A&P                      Patient presents with abdominal pain, nausea, and vomiting. Patient is nontoxic appearing, afebrile, not tachypneic, not hypotensive, maintains excellent SPO2 on room air, and is in no apparent distress.  Mildly tachycardic.  I have reviewed the patient's chart to obtain more information.   I reviewed and interpreted the patient's labs and radiological studies.  Acute appendicitis noted on CT.  Patient admitted to [facility] for management by general surgery.   Findings and plan of care discussed with Sherwood Gambler, MD. Dr. Regenia Skeeter personally evaluated and examined this patient.   Vitals:   11/03/19 1716 11/03/19 1856 11/03/19 1922 11/03/19 1931  BP: (!) 177/99 (!) 181/110 (!) 175/118   Pulse: 100 94 (!) 112   Resp: 19 20 20    Temp:    99.9 F (37.7 C)  TempSrc:    Oral  SpO2: 100% 100% 100%   Weight:      Height:       Vitals:   11/03/19 1922 11/03/19 1931 11/03/19 2000 11/03/19 2054  BP: (!) 175/118  (!) 174/108 (!) 156/99  Pulse: (!) 112  (!) 109 (!) 108  Resp: 20  19 20   Temp:  99.9 F (37.7 C)  99.4 F (37.4 C)  TempSrc:  Oral  Oral  SpO2: 100%  95% 100%  Weight:      Height:         Final Clinical Impression(s) / ED Diagnoses Final diagnoses:  Acute appendicitis, unspecified acute appendicitis type    Rx / DC Orders ED Discharge Orders    None       Layla Maw 11/03/19 2056    Sherwood Gambler, MD 11/06/19 1655

## 2019-11-03 NOTE — ED Notes (Signed)
This RN looked for placed to draw labs, unable to visualize, Edison Nasuti EMT stuck 2X in attempt for blood, unable to obtain. Pt reports history of [place] IV.

## 2019-11-03 NOTE — ED Notes (Signed)
States has abd pain with nausea and vomiting since early am today, rates pain an 8 on 0-10 scale, facial grimacing noted. States feels full, bloated.

## 2019-11-03 NOTE — ED Notes (Signed)
Attempted to call [facility] 3rd floor @ [phone] for report with no answer x 2

## 2019-11-03 NOTE — H&P (Signed)
CC: abd pain  Requesting provider: Dr Hilma Favors  HPI: Amy Nunez is an 59 y.o. female who is here for abd pain.  Poor historian.  States pain is generalized.  Had nausea and vomiting yesterdat.  No fevers.  H/o R nephrectomy.  Past Medical History:  Diagnosis Date  . Blood transfusion without reported diagnosis   . Cancer [facility])    renal cancer  . Diabetes mellitus without complication ([place])   . Hypertension   . IBS (irritable bowel syndrome)     Past Surgical History:  Procedure Laterality Date  . ABDOMINAL HYSTERECTOMY    . ANKLE SURGERY    . COLONOSCOPY  2018   [facility]  . ESOPHAGOGASTRODUODENOSCOPY  10/15/2016   [facility]  . NEPHRECTOMY Right     Family History  Problem Relation Age of Onset  . Colon cancer Neg Hx   . Esophageal cancer Neg Hx     Social:  reports that she has quit smoking. She has never used smokeless tobacco. She reports previous alcohol use. She reports that she does not use drugs.  Allergies:  Allergies  Allergen Reactions  . Penicillins Anaphylaxis and Rash    Other reaction(s): Unknown  . Ciprofloxacin Hives  . Red Dye Diarrhea    Other reaction(s): Unknown    Medications: I have reviewed the patient's current medications.  Results for orders placed or performed during the hospital encounter of 11/03/19 (from the past 48 hour(s))  POC CBG, ED     Status: Abnormal   Collection Time: 11/03/19  1:58 PM  Result Value Ref Range   Glucose-Capillary 275 (H) 70 - 99 mg/dL    Comment: Glucose reference range applies only to samples taken after fasting for at least 8 hours.  CBC     Status: Abnormal   Collection Time: 11/03/19  3:13 PM  Result Value Ref Range   WBC 14.0 (H) 4.0 - 10.5 K/uL   RBC 3.84 (L) 3.87 - 5.11 MIL/uL   Hemoglobin 12.0 12.0 - 15.0 g/dL   HCT 34.9 (L) 36.0 - 46.0 %   MCV 90.9 80.0 - 100.0 fL   MCH 31.3 26.0 - 34.0 pg   MCHC 34.4 30.0 - 36.0 g/dL   RDW 12.3 11.5 - 15.5 %   Platelets 430 (H) 150 - 400 K/uL   nRBC 0.0  0.0 - 0.2 %    Comment: Performed at [facility], [address] [place], [place] [address]  Comprehensive metabolic panel     Status: Abnormal   Collection Time: 11/03/19  3:13 PM  Result Value Ref Range   Sodium 132 (L) 135 - 145 mmol/L   Potassium 4.1 3.5 - 5.1 mmol/L   Chloride 100 98 - 111 mmol/L   CO2 18 (L) 22 - 32 mmol/L   Glucose, Bld 306 (H) 70 - 99 mg/dL    Comment: Glucose reference range applies only to samples taken after fasting for at least 8 hours.   BUN 24 (H) 6 - 20 mg/dL   Creatinine, Ser 1.53 (H) 0.44 - 1.00 mg/dL   Calcium 9.5 8.9 - 10.3 mg/dL   Total Protein 8.1 6.5 - 8.1 g/dL   Albumin 4.3 3.5 - 5.0 g/dL   AST 32 15 - 41 U/L   ALT 40 0 - 44 U/L   Alkaline Phosphatase 102 38 - 126 U/L   Total Bilirubin 0.9 0.3 - 1.2 mg/dL   GFR calc non Af Amer 37 (L) >60 mL/min  GFR calc Af Amer 42 (L) >60 mL/min   Anion gap 14 5 - 15    Comment: Performed at [facility], [address] [place], [place] [address]  Lipase, blood     Status: Abnormal   Collection Time: 11/03/19  3:13 PM  Result Value Ref Range   Lipase 63 (H) 11 - 51 U/L    Comment: Performed at [facility], [address] [place], [place] [address]  Differential     Status: Abnormal   Collection Time: 11/03/19  3:13 PM  Result Value Ref Range   Neutrophils Relative % 88 %   Neutro Abs 12.5 (H) 1.7 - 7.7 K/uL   Lymphocytes Relative 8 %   Lymphs Abs 1.1 0.7 - 4.0 K/uL   Monocytes Relative 3 %   Monocytes Absolute 0.4 0.1 - 1.0 K/uL   Eosinophils Relative 0 %   Eosinophils Absolute 0.0 0.0 - 0.5 K/uL   Basophils Relative 0 %   Basophils Absolute 0.0 0.0 - 0.1 K/uL   Immature Granulocytes 1 %   Abs Immature Granulocytes 0.07 0.00 - 0.07 K/uL    Comment: Performed at [facility], [address] [place], [place] [address]  Urinalysis, Routine w reflex microscopic     Status: Abnormal   Collection Time: 11/03/19  6:44 PM  Result Value Ref Range    Color, Urine YELLOW YELLOW   APPearance CLEAR CLEAR   Specific Gravity, Urine 1.015 1.005 - 1.030   pH 6.0 5.0 - 8.0   Glucose, UA 250 (A) NEGATIVE mg/dL   Hgb urine dipstick SMALL (A) NEGATIVE   Bilirubin Urine NEGATIVE NEGATIVE   Ketones, ur 15 (A) NEGATIVE mg/dL   Protein, ur NEGATIVE NEGATIVE mg/dL   Nitrite NEGATIVE NEGATIVE   Leukocytes,Ua NEGATIVE NEGATIVE    Comment: Performed at [facility], [address] [place], [place] [address]  Urine rapid drug screen (hosp performed)     Status: Abnormal   Collection Time: 11/03/19  6:44 PM  Result Value Ref Range   Opiates POSITIVE (A) NONE DETECTED   Cocaine NONE DETECTED NONE DETECTED   Benzodiazepines NONE DETECTED NONE DETECTED   Amphetamines NONE DETECTED NONE DETECTED   Tetrahydrocannabinol NONE DETECTED NONE DETECTED   Barbiturates NONE DETECTED NONE DETECTED    Comment: (NOTE) DRUG SCREEN FOR MEDICAL PURPOSES ONLY.  IF CONFIRMATION IS NEEDED FOR ANY PURPOSE, NOTIFY LAB WITHIN 5 DAYS. LOWEST DETECTABLE LIMITS FOR URINE DRUG SCREEN Drug Class                     Cutoff (ng/mL) Amphetamine and metabolites    1000 Barbiturate and metabolites    200 Benzodiazepine                 [id number] Tricyclics and metabolites     300 Opiates and metabolites        300 Cocaine and metabolites        300 THC                            50 Performed at [facility], [address] [place], [place] [address]   Urinalysis, Microscopic (reflex)     Status: Abnormal   Collection Time: 11/03/19  6:44 PM  Result Value Ref Range   RBC / HPF 0-5 0 - 5 RBC/hpf   WBC, UA 0-5  0 - 5 WBC/hpf   Bacteria, UA RARE (A) NONE SEEN   Squamous Epithelial / LPF 0-5 0 - 5    Comment: Performed at [facility], [address] [place], [place] [address]    CT ABDOMEN PELVIS W CONTRAST  Result Date: 11/03/2019 CLINICAL DATA:  Abdominal pain and neutropenia. EXAM: CT ABDOMEN AND PELVIS WITH CONTRAST TECHNIQUE: Multidetector  CT imaging of the abdomen and pelvis was performed using the standard protocol following bolus administration of intravenous contrast. Automatic exposure control utilized. CONTRAST:  52mL OMNIPAQUE IOHEXOL 300 MG/ML  SOLN COMPARISON:  September 18, 2019. FINDINGS: Lower chest: Mild cardiomegaly with mild right main and left circumflex coronary calcification. Central pulmonary vascular congestion without pulmonary edema or pleural effusion. Thin linear atelectasis in the lung bases. Moderately sized hiatal hernia. Hepatobiliary: Fatty infiltration of the liver. Normal hepatic size and normal smooth hepatic contours. Decompressed and otherwise normal gallbladder. Normal biliary tree. Pancreas: Mild atrophy. Patient motion degrades image quality in this region and subtle inflammatory changes not excluded. There is no apparent dilatation of the main pancreatic duct or sequela of chronic pancreatitis. Spleen: Small spleen measuring 7.6 x 4.6 cm in the axial plane. Normal splenic contours. Adrenals/Urinary Tract: Normal bilateral adrenal glands. Right nephrectomy with nonspecific asymmetrically increased size of the right psoas along the course of the right ureter and surgical clips that could be metastatic invasion of the musculature versus a benign etiology. Normal size and cortical thickness of the left kidney with a complicated 2.5 cm lower pole cortical cyst with focal aches shell type calcification on the lateral periphery and 13 Hounsfield unit density internally. Stomach/Bowel: Abnormally dilated fluid field retrocecal appendix measuring 10 mm diameter with adjacent mild inflammatory change medial to the right iliac vessels on axial series 2, image 58 and sagittal series 6, image 49. Nonobstructed. Diverticulosis in the proximal and distal colon with an acute mild right lower quadrant diverticulitis involving the ascending colon and cecum. No associated perforation or abscess. Adhesion formation of the duodenal C  loop to the adjacent small bowel loops and right psoas in the nephrectomy bed. Vascular/Lymphatic: Small right lower quadrant mesenteric lymph nodes, likely reactive. Thoracoabdominal aorta and bilateral iliac calcified atherosclerosis without an abdominal aortic aneurysm. 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 Reproductive: Hysterectomy. No adnexal cyst or mass. Other: No free intraperitoneal fluid or air. Musculoskeletal: Atrophy and diastasis of the abdominal rectus musculature. Moderate skeletal degenerative change and mild bone demineralization. Critical Value/emergent results were called by telephone at the time of interpretation on 11/03/2019 at 7:22 pm to provider [facility] , who verbally acknowledged these results. IMPRESSION: 1. Acute uncomplicated appendicitis and mild uncomplicated diverticulitis of the ascending colon. 2. Right nephrectomy with nonspecific asymmetrically increased size of the right psoas along the course of the right ureter and surgical clips that could be metastatic invasion of the musculature versus a benign etiology. Additional nonemergent outpatient MR abdomen pelvis with intravenous contrast recommended to assess for any suspicious muscle enhancement. Correlation with surgical history may be useful. 3. Adhesion formation of the duodenal C loop to the adjacent small bowel loops and right psoas in the nephrectomy bed without bowel obstruction. 4. A complicated left renal midpole cyst. 5. Fatty infiltration of the liver. 6. Moderately sized hiatal hernia. 7. Mild cardiomegaly with mild right main and left circumflex coronary artery disease. Aortic Atherosclerosis (ICD10-I70.0). Electronically Signed   By: Revonda Humphrey   On:  11/03/2019 19:23    ROS - all of the below systems have been reviewed with the patient and positives are indicated with bold text General: chills, fever or night sweats Eyes: blurry vision or double  vision ENT: epistaxis or sore throat Allergy/Immunology: itchy/watery eyes or nasal congestion Hematologic/Lymphatic: bleeding problems, blood clots or swollen lymph nodes Endocrine: temperature intolerance or unexpected weight changes Breast: new or changing breast lumps or nipple discharge Resp: cough, shortness of breath, or wheezing CV: chest pain or dyspnea on exertion GI: as per HPI GU: dysuria, trouble voiding, or hematuria MSK: joint pain or joint stiffness Neuro: TIA or stroke symptoms Derm: pruritus and skin lesion changes Psych: anxiety and depression  PE Blood pressure (!) 175/118, pulse (!) 112, temperature 99.9 F (37.7 C), temperature source Oral, resp. rate 20, height 5' 5.5" (1.664 m), weight 95.4 kg, SpO2 100 %. Constitutional: NAD; conversant; no deformities Eyes: Moist conjunctiva; no lid lag; anicteric; PERRL Neck: Trachea midline; no thyromegaly Lungs: Normal respiratory effort; no tactile fremitus CV: RRR; no palpable thrills; no pitting edema GI: Abd TTP suprapubic area; no palpable hepatosplenomegaly MSK: Normal range of motion of extremities; no clubbing/cyanosis Psychiatric: Appropriate affect; alert and oriented x3 Lymphatic: No palpable cervical or axillary lymphadenopathy  Results for orders placed or performed during the hospital encounter of 11/03/19 (from the past 48 hour(s))  POC CBG, ED     Status: Abnormal   Collection Time: 11/03/19  1:58 PM  Result Value Ref Range   Glucose-Capillary 275 (H) 70 - 99 mg/dL    Comment: Glucose reference range applies only to samples taken after fasting for at least 8 hours.  CBC     Status: Abnormal   Collection Time: 11/03/19  3:13 PM  Result Value Ref Range   WBC 14.0 (H) 4.0 - 10.5 K/uL   RBC 3.84 (L) 3.87 - 5.11 MIL/uL   Hemoglobin 12.0 12.0 - 15.0 g/dL   HCT 34.9 (L) 36.0 - 46.0 %   MCV 90.9 80.0 - 100.0 fL   MCH 31.3 26.0 - 34.0 pg   MCHC 34.4 30.0 - 36.0 g/dL   RDW 12.3 11.5 - 15.5 %   Platelets  430 (H) 150 - 400 K/uL   nRBC 0.0 0.0 - 0.2 %    Comment: Performed at [facility], [address] [place], [place] [address]  Comprehensive metabolic panel     Status: Abnormal   Collection Time: 11/03/19  3:13 PM  Result Value Ref Range   Sodium 132 (L) 135 - 145 mmol/L   Potassium 4.1 3.5 - 5.1 mmol/L   Chloride 100 98 - 111 mmol/L   CO2 18 (L) 22 - 32 mmol/L   Glucose, Bld 306 (H) 70 - 99 mg/dL    Comment: Glucose reference range applies only to samples taken after fasting for at least 8 hours.   BUN 24 (H) 6 - 20 mg/dL   Creatinine, Ser 1.53 (H) 0.44 - 1.00 mg/dL   Calcium 9.5 8.9 - 10.3 mg/dL   Total Protein 8.1 6.5 - 8.1 g/dL   Albumin 4.3 3.5 - 5.0 g/dL   AST 32 15 - 41 U/L   ALT 40 0 - 44 U/L   Alkaline Phosphatase 102 38 - 126 U/L   Total Bilirubin 0.9 0.3 - 1.2 mg/dL   GFR calc non Af Amer 37 (L) >60 mL/min   GFR calc Af Amer 42 (L) >60 mL/min   Anion gap 14 5 - 15  Comment: Performed at [facility], [address] [place], [place] [address]  Lipase, blood     Status: Abnormal   Collection Time: 11/03/19  3:13 PM  Result Value Ref Range   Lipase 63 (H) 11 - 51 U/L    Comment: Performed at [facility], [address] [place], [place] [address]  Differential     Status: Abnormal   Collection Time: 11/03/19  3:13 PM  Result Value Ref Range   Neutrophils Relative % 88 %   Neutro Abs 12.5 (H) 1.7 - 7.7 K/uL   Lymphocytes Relative 8 %   Lymphs Abs 1.1 0.7 - 4.0 K/uL   Monocytes Relative 3 %   Monocytes Absolute 0.4 0.1 - 1.0 K/uL   Eosinophils Relative 0 %   Eosinophils Absolute 0.0 0.0 - 0.5 K/uL   Basophils Relative 0 %   Basophils Absolute 0.0 0.0 - 0.1 K/uL   Immature Granulocytes 1 %   Abs Immature Granulocytes 0.07 0.00 - 0.07 K/uL    Comment: Performed at [facility], [address] [place], [place] [address]  Urinalysis, Routine w reflex microscopic     Status: Abnormal   Collection Time: 11/03/19  6:44  PM  Result Value Ref Range   Color, Urine YELLOW YELLOW   APPearance CLEAR CLEAR   Specific Gravity, Urine 1.015 1.005 - 1.030   pH 6.0 5.0 - 8.0   Glucose, UA 250 (A) NEGATIVE mg/dL   Hgb urine dipstick SMALL (A) NEGATIVE   Bilirubin Urine NEGATIVE NEGATIVE   Ketones, ur 15 (A) NEGATIVE mg/dL   Protein, ur NEGATIVE NEGATIVE mg/dL   Nitrite NEGATIVE NEGATIVE   Leukocytes,Ua NEGATIVE NEGATIVE    Comment: Performed at [facility], [address] [place], [place] [address]  Urine rapid drug screen (hosp performed)     Status: Abnormal   Collection Time: 11/03/19  6:44 PM  Result Value Ref Range   Opiates POSITIVE (A) NONE DETECTED   Cocaine NONE DETECTED NONE DETECTED   Benzodiazepines NONE DETECTED NONE DETECTED   Amphetamines NONE DETECTED NONE DETECTED   Tetrahydrocannabinol NONE DETECTED NONE DETECTED   Barbiturates NONE DETECTED NONE DETECTED    Comment: (NOTE) DRUG SCREEN FOR MEDICAL PURPOSES ONLY.  IF CONFIRMATION IS NEEDED FOR ANY PURPOSE, NOTIFY LAB WITHIN 5 DAYS. LOWEST DETECTABLE LIMITS FOR URINE DRUG SCREEN Drug Class                     Cutoff (ng/mL) Amphetamine and metabolites    1000 Barbiturate and metabolites    200 Benzodiazepine                 [id number] Tricyclics and metabolites     300 Opiates and metabolites        300 Cocaine and metabolites        300 THC                            50 Performed at [facility], [address] [place], [place] [address]   Urinalysis, Microscopic (reflex)     Status: Abnormal   Collection Time: 11/03/19  6:44 PM  Result Value Ref Range   RBC / HPF 0-5 0 - 5 RBC/hpf   WBC, UA 0-5 0 - 5 WBC/hpf   Bacteria, UA RARE (A) NONE SEEN   Squamous Epithelial / LPF 0-5  0 - 5    Comment: Performed at [facility], [address] [place], [place] [address]    CT ABDOMEN PELVIS W CONTRAST  Result Date: 11/03/2019 CLINICAL DATA:  Abdominal pain and neutropenia. EXAM: CT ABDOMEN AND PELVIS WITH  CONTRAST TECHNIQUE: Multidetector CT imaging of the abdomen and pelvis was performed using the standard protocol following bolus administration of intravenous contrast. Automatic exposure control utilized. CONTRAST:  52mL OMNIPAQUE IOHEXOL 300 MG/ML  SOLN COMPARISON:  September 18, 2019. FINDINGS: Lower chest: Mild cardiomegaly with mild right main and left circumflex coronary calcification. Central pulmonary vascular congestion without pulmonary edema or pleural effusion. Thin linear atelectasis in the lung bases. Moderately sized hiatal hernia. Hepatobiliary: Fatty infiltration of the liver. Normal hepatic size and normal smooth hepatic contours. Decompressed and otherwise normal gallbladder. Normal biliary tree. Pancreas: Mild atrophy. Patient motion degrades image quality in this region and subtle inflammatory changes not excluded. There is no apparent dilatation of the main pancreatic duct or sequela of chronic pancreatitis. Spleen: Small spleen measuring 7.6 x 4.6 cm in the axial plane. Normal splenic contours. Adrenals/Urinary Tract: Normal bilateral adrenal glands. Right nephrectomy with nonspecific asymmetrically increased size of the right psoas along the course of the right ureter and surgical clips that could be metastatic invasion of the musculature versus a benign etiology. Normal size and cortical thickness of the left kidney with a complicated 2.5 cm lower pole cortical cyst with focal aches shell type calcification on the lateral periphery and 13 Hounsfield unit density internally. Stomach/Bowel: Abnormally dilated fluid field retrocecal appendix measuring 10 mm diameter with adjacent mild inflammatory change medial to the right iliac vessels on axial series 2, image 58 and sagittal series 6, image 49. Nonobstructed. Diverticulosis in the proximal and distal colon with an acute mild right lower quadrant diverticulitis involving the ascending colon and cecum. No associated perforation or abscess.  Adhesion formation of the duodenal C loop to the adjacent small bowel loops and right psoas in the nephrectomy bed. Vascular/Lymphatic: Small right lower quadrant mesenteric lymph nodes, likely reactive. Thoracoabdominal aorta and bilateral iliac calcified atherosclerosis without an abdominal aortic aneurysm. 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 Reproductive: Hysterectomy. No adnexal cyst or mass. Other: No free intraperitoneal fluid or air. Musculoskeletal: Atrophy and diastasis of the abdominal rectus musculature. Moderate skeletal degenerative change and mild bone demineralization. Critical Value/emergent results were called by telephone at the time of interpretation on 11/03/2019 at 7:22 pm to provider [facility] , who verbally acknowledged these results. IMPRESSION: 1. Acute uncomplicated appendicitis and mild uncomplicated diverticulitis of the ascending colon. 2. Right nephrectomy with nonspecific asymmetrically increased size of the right psoas along the course of the right ureter and surgical clips that could be metastatic invasion of the musculature versus a benign etiology. Additional nonemergent outpatient MR abdomen pelvis with intravenous contrast recommended to assess for any suspicious muscle enhancement. Correlation with surgical history may be useful. 3. Adhesion formation of the duodenal C loop to the adjacent small bowel loops and right psoas in the nephrectomy bed without bowel obstruction. 4. A complicated left renal midpole cyst. 5. Fatty infiltration of the liver. 6. Moderately sized hiatal hernia. 7. Mild cardiomegaly with mild right main and left circumflex coronary artery disease. Aortic Atherosclerosis (ICD10-I70.0). Electronically Signed   By: Revonda Humphrey   On: 11/03/2019 19:23     A/P: Amy Nunez is an 59 y.o. female with acute appendicitis.  IV abx started in ED.  Pt has been transferred from [facility] for  surgery. NPO IVF's  Rosario Adie, MD  Colorectal and General Surgery [facility]  Use [email or web address] to contact on call provider

## 2019-11-03 NOTE — ED Notes (Signed)
Pt is sticking her fingers down her throat to induce vomiting. Dry heaves.

## 2019-11-03 NOTE — ED Notes (Signed)
Pt ambulated to bathroom without difficulty.

## 2019-11-03 NOTE — ED Notes (Signed)
Pt states she is unable to urinate at this time.

## 2019-11-03 NOTE — ED Provider Notes (Signed)
Angiocath insertion Performed by: Ephraim Hamburger  Consent: Verbal consent obtained. Risks and benefits: risks, benefits and alternatives were discussed Time out: Immediately prior to procedure a "time out" was called to verify the correct patient, procedure, equipment, support staff and site/side marked as required.  Preparation: Patient was prepped and draped in the usual sterile fashion.  Vein Location: right basilic  Ultrasound Guided  Gauge: 20  Normal blood return and flush without difficulty Patient tolerance: Patient tolerated the procedure well with no immediate complications.      Sherwood Gambler, MD 11/03/19 [phone]

## 2019-11-04 ENCOUNTER — Observation Stay (HOSPITAL_COMMUNITY): Payer: Medicare HMO | Admitting: Anesthesiology

## 2019-11-04 ENCOUNTER — Encounter (HOSPITAL_COMMUNITY): Admission: EM | Disposition: A | Discharge status: Home / Self Care | Payer: Self-pay | Source: Home / Self Care

## 2019-11-04 ENCOUNTER — Encounter (HOSPITAL_COMMUNITY): Payer: Self-pay

## 2019-11-04 HISTORY — PX: LAPAROSCOPIC APPENDECTOMY: SHX408

## 2019-11-04 LAB — BASIC METABOLIC PANEL
Anion gap: 15 (ref 5–15)
BUN: 20 mg/dL (ref 6–20)
CO2: 17 mmol/L — ABNORMAL LOW (ref 22–32)
Calcium: 9 mg/dL (ref 8.9–10.3)
Chloride: 104 mmol/L (ref 98–111)
Creatinine, Ser: 1.35 mg/dL — ABNORMAL HIGH (ref 0.44–1.00)
GFR calc Af Amer: 49 mL/min — ABNORMAL LOW (ref >60)
GFR calc non Af Amer: 43 mL/min — ABNORMAL LOW (ref >60)
Glucose, Bld: 236 mg/dL — ABNORMAL HIGH (ref 70–99)
Potassium: 4 mmol/L (ref 3.5–5.1)
Sodium: 136 mmol/L (ref 135–145)

## 2019-11-04 LAB — SURGICAL PCR SCREEN
MRSA, PCR: NEGATIVE
Staphylococcus aureus: NEGATIVE

## 2019-11-04 LAB — CBC
HCT: 34.6 % — ABNORMAL LOW (ref 36.0–46.0)
Hemoglobin: 12.3 g/dL (ref 12.0–15.0)
MCH: 31.5 pg (ref 26.0–34.0)
MCHC: 35.5 g/dL (ref 30.0–36.0)
MCV: 88.7 fL (ref 80.0–100.0)
Platelets: 352 10*3/uL (ref 150–400)
RBC: 3.9 MIL/uL (ref 3.87–5.11)
RDW: 12.2 % (ref 11.5–15.5)
WBC: 18.1 10*3/uL — ABNORMAL HIGH (ref 4.0–10.5)
nRBC: 0 % (ref 0.0–0.2)

## 2019-11-04 LAB — GLUCOSE, CAPILLARY
Glucose-Capillary: 216 mg/dL — ABNORMAL HIGH (ref 70–99)
Glucose-Capillary: 206 mg/dL — ABNORMAL HIGH (ref 70–99)
Glucose-Capillary: 257 mg/dL — ABNORMAL HIGH (ref 70–99)

## 2019-11-04 LAB — URINE CULTURE: Culture: NO GROWTH

## 2019-11-04 SURGERY — APPENDECTOMY, LAPAROSCOPIC
Anesthesia: General | Site: Abdomen

## 2019-11-04 MED ORDER — ACETAMINOPHEN 500 MG PO TABS
1000.0000 mg | ORAL_TABLET | Freq: Once | ORAL | Status: AC
  Administered 2019-11-04: 1000 mg via ORAL
  Filled 2019-11-04: qty 2

## 2019-11-04 MED ORDER — HYDROMORPHONE HCL 1 MG/ML IJ SOLN
0.2500 mg | INTRAMUSCULAR | Status: DC | PRN
  Administered 2019-11-04: 15:00:00 0.5 mg via INTRAVENOUS
  Administered 2019-11-04: 0.25 mg via INTRAVENOUS
  Administered 2019-11-04: 0.5 mg via INTRAVENOUS
  Administered 2019-11-04: 0.25 mg via INTRAVENOUS

## 2019-11-04 MED ORDER — INSULIN ASPART 100 UNIT/ML ~~LOC~~ SOLN
5.0000 [IU] | Freq: Once | SUBCUTANEOUS | Status: AC
  Administered 2019-11-04: 5 [IU] via SUBCUTANEOUS

## 2019-11-04 MED ORDER — HYDRALAZINE HCL 20 MG/ML IJ SOLN
INTRAMUSCULAR | Status: AC
  Filled 2019-11-04: qty 1

## 2019-11-04 MED ORDER — PROPOFOL 10 MG/ML IV BOLUS
INTRAVENOUS | Status: DC | PRN
  Administered 2019-11-04: 150 mg via INTRAVENOUS

## 2019-11-04 MED ORDER — ESMOLOL HCL 100 MG/10ML IV SOLN
INTRAVENOUS | Status: DC | PRN
  Administered 2019-11-04: 10 mg via INTRAVENOUS
  Administered 2019-11-04: 30 mg via INTRAVENOUS
  Administered 2019-11-04 (×2): 20 mg via INTRAVENOUS
  Administered 2019-11-04: 10 mg via INTRAVENOUS

## 2019-11-04 MED ORDER — ONDANSETRON HCL 4 MG/2ML IJ SOLN
4.0000 mg | Freq: Once | INTRAMUSCULAR | Status: AC | PRN
  Administered 2019-11-04: 4 mg via INTRAVENOUS

## 2019-11-04 MED ORDER — ESMOLOL HCL 100 MG/10ML IV SOLN
INTRAVENOUS | Status: AC
  Filled 2019-11-04: qty 10

## 2019-11-04 MED ORDER — INSULIN ASPART 100 UNIT/ML ~~LOC~~ SOLN
SUBCUTANEOUS | Status: AC
  Filled 2019-11-04: qty 1

## 2019-11-04 MED ORDER — OXYCODONE HCL 5 MG PO TABS: 5.0000 mg | ORAL_TABLET | Freq: Once | ORAL | Status: DC | PRN

## 2019-11-04 MED ORDER — LIDOCAINE 2% (20 MG/ML) 5 ML SYRINGE
INTRAMUSCULAR | Status: DC | PRN
  Administered 2019-11-04: 80 mg via INTRAVENOUS

## 2019-11-04 MED ORDER — LABETALOL HCL 5 MG/ML IV SOLN
INTRAVENOUS | Status: DC | PRN
  Administered 2019-11-04: 5 mg via INTRAVENOUS

## 2019-11-04 MED ORDER — MIDAZOLAM HCL 5 MG/5ML IJ SOLN
INTRAMUSCULAR | Status: DC | PRN
  Administered 2019-11-04 (×2): 1 mg via INTRAVENOUS

## 2019-11-04 MED ORDER — HYDROMORPHONE HCL 1 MG/ML IJ SOLN
INTRAMUSCULAR | Status: AC
  Filled 2019-11-04: qty 1

## 2019-11-04 MED ORDER — ROCURONIUM BROMIDE 10 MG/ML (PF) SYRINGE
PREFILLED_SYRINGE | INTRAVENOUS | Status: AC
  Filled 2019-11-04: qty 10

## 2019-11-04 MED ORDER — HYDRALAZINE HCL 20 MG/ML IJ SOLN
5.0000 mg | Freq: Once | INTRAMUSCULAR | Status: AC
  Administered 2019-11-04: 5 mg via INTRAVENOUS

## 2019-11-04 MED ORDER — DEXAMETHASONE SODIUM PHOSPHATE 10 MG/ML IJ SOLN
INTRAMUSCULAR | Status: AC
  Filled 2019-11-04: qty 1

## 2019-11-04 MED ORDER — PROPOFOL 10 MG/ML IV BOLUS
INTRAVENOUS | Status: AC
  Filled 2019-11-04: qty 20

## 2019-11-04 MED ORDER — LABETALOL HCL 5 MG/ML IV SOLN
INTRAVENOUS | Status: AC
  Filled 2019-11-04: qty 4

## 2019-11-04 MED ORDER — HYDROMORPHONE HCL 1 MG/ML IJ SOLN: 1.0000 mg | INTRAMUSCULAR | Status: DC | PRN

## 2019-11-04 MED ORDER — ONDANSETRON HCL 4 MG/2ML IJ SOLN
INTRAMUSCULAR | Status: DC | PRN
  Administered 2019-11-04: 4 mg via INTRAVENOUS

## 2019-11-04 MED ORDER — LABETALOL HCL 5 MG/ML IV SOLN
5.0000 mg | INTRAVENOUS | Status: AC | PRN
  Administered 2019-11-04 (×4): 5 mg via INTRAVENOUS

## 2019-11-04 MED ORDER — FENTANYL CITRATE (PF) 100 MCG/2ML IJ SOLN
INTRAMUSCULAR | Status: DC | PRN
  Administered 2019-11-04: 100 ug via INTRAVENOUS

## 2019-11-04 MED ORDER — ONDANSETRON HCL 4 MG/2ML IJ SOLN
INTRAMUSCULAR | Status: AC
  Filled 2019-11-04: qty 2

## 2019-11-04 MED ORDER — SUCCINYLCHOLINE CHLORIDE 200 MG/10ML IV SOSY
PREFILLED_SYRINGE | INTRAVENOUS | Status: DC | PRN
  Administered 2019-11-04: 100 mg via INTRAVENOUS

## 2019-11-04 MED ORDER — FENTANYL CITRATE (PF) 100 MCG/2ML IJ SOLN
INTRAMUSCULAR | Status: AC
  Filled 2019-11-04: qty 2

## 2019-11-04 MED ORDER — OXYCODONE HCL 5 MG/5ML PO SOLN: 5.0000 mg | Freq: Once | ORAL | Status: DC | PRN

## 2019-11-04 MED ORDER — LABETALOL HCL 5 MG/ML IV SOLN
INTRAVENOUS | Status: DC | PRN
  Administered 2019-11-04 (×2): 5 mg via INTRAVENOUS

## 2019-11-04 MED ORDER — DEXAMETHASONE SODIUM PHOSPHATE 10 MG/ML IJ SOLN
INTRAMUSCULAR | Status: DC | PRN
  Administered 2019-11-04: 5 mg via INTRAVENOUS

## 2019-11-04 MED ORDER — OXYCODONE HCL 5 MG PO TABS
5.0000 mg | ORAL_TABLET | ORAL | Status: DC | PRN
  Administered 2019-11-04 – 2019-11-08 (×10): 5 mg via ORAL
  Filled 2019-11-04 (×10): qty 1

## 2019-11-04 MED ORDER — SCOPOLAMINE 1 MG/3DAYS TD PT72
1.0000 | MEDICATED_PATCH | Freq: Once | TRANSDERMAL | Status: DC
  Administered 2019-11-04: 09:00:00 1.5 mg via TRANSDERMAL
  Filled 2019-11-04: qty 1

## 2019-11-04 MED ORDER — SUGAMMADEX SODIUM 200 MG/2ML IV SOLN
INTRAVENOUS | Status: DC | PRN
  Administered 2019-11-04: 200 mg via INTRAVENOUS

## 2019-11-04 MED ORDER — ONDANSETRON HCL 4 MG/2ML IJ SOLN
4.0000 mg | Freq: Three times a day (TID) | INTRAMUSCULAR | Status: DC | PRN
  Administered 2019-11-04 – 2019-11-08 (×3): 4 mg via INTRAVENOUS
  Filled 2019-11-04 (×3): qty 2

## 2019-11-04 MED ORDER — LACTATED RINGERS IR SOLN
Status: DC | PRN
  Administered 2019-11-04: 1000 mL

## 2019-11-04 MED ORDER — BUPIVACAINE HCL (PF) 0.5 % IJ SOLN
INTRAMUSCULAR | Status: DC | PRN
  Administered 2019-11-04: 20 mL

## 2019-11-04 MED ORDER — 0.9 % SODIUM CHLORIDE (POUR BTL) OPTIME
TOPICAL | Status: DC | PRN
  Administered 2019-11-04: 1000 mL

## 2019-11-04 MED ORDER — LACTATED RINGERS IV SOLN: INTRAVENOUS | Status: DC

## 2019-11-04 MED ORDER — ROCURONIUM BROMIDE 50 MG/5ML IV SOSY
PREFILLED_SYRINGE | INTRAVENOUS | Status: DC | PRN
  Administered 2019-11-04: 40 mg via INTRAVENOUS

## 2019-11-04 MED ORDER — MIDAZOLAM HCL 2 MG/2ML IJ SOLN
INTRAMUSCULAR | Status: AC
  Filled 2019-11-04: qty 2

## 2019-11-04 MED ORDER — SODIUM CHLORIDE 0.9 % IV SOLN: INTRAVENOUS | Status: DC

## 2019-11-04 MED ORDER — LIDOCAINE 2% (20 MG/ML) 5 ML SYRINGE
INTRAMUSCULAR | Status: AC
  Filled 2019-11-04: qty 5

## 2019-11-04 MED ORDER — SUCCINYLCHOLINE CHLORIDE 200 MG/10ML IV SOSY
PREFILLED_SYRINGE | INTRAVENOUS | Status: AC
  Filled 2019-11-04: qty 10

## 2019-11-04 MED ORDER — BUPIVACAINE HCL (PF) 0.5 % IJ SOLN
INTRAMUSCULAR | Status: AC
  Filled 2019-11-04: qty 30

## 2019-11-04 MED ORDER — HYDROMORPHONE HCL 2 MG/ML IJ SOLN
INTRAMUSCULAR | Status: AC
  Filled 2019-11-04: qty 1

## 2019-11-04 MED ORDER — HYDROMORPHONE HCL 1 MG/ML IJ SOLN
INTRAMUSCULAR | Status: DC | PRN
  Administered 2019-11-04 (×4): .4 mg via INTRAVENOUS

## 2019-11-04 SURGICAL SUPPLY — 35 items
ADH SKN CLS APL DERMABOND .7 (GAUZE/BANDAGES/DRESSINGS) ×1
APL PRP STRL LF DISP 70% ISPRP (MISCELLANEOUS) ×1
APPLIER CLIP 5 13 M/L LIGAMAX5 (MISCELLANEOUS)
APR CLP MED LRG 5 ANG JAW (MISCELLANEOUS)
BAG SPEC RTRVL LRG 6X4 10 (ENDOMECHANICALS) ×1
CHLORAPREP W/TINT 26 (MISCELLANEOUS) ×2 IMPLANT
CLIP APPLIE 5 13 M/L LIGAMAX5 (MISCELLANEOUS) IMPLANT
COVER SURGICAL LIGHT HANDLE (MISCELLANEOUS) ×2 IMPLANT
COVER WAND RF STERILE (DRAPES) IMPLANT
CUTTER FLEX LINEAR 45M (STAPLE) ×1 IMPLANT
DECANTER SPIKE VIAL GLASS SM (MISCELLANEOUS) ×2 IMPLANT
DERMABOND ADVANCED (GAUZE/BANDAGES/DRESSINGS) ×1
DERMABOND ADVANCED .7 DNX12 (GAUZE/BANDAGES/DRESSINGS) ×1 IMPLANT
DRAPE LAPAROSCOPIC ABDOMINAL (DRAPES) ×2 IMPLANT
ELECT REM PT RETURN 15FT ADLT (MISCELLANEOUS) ×2 IMPLANT
GOWN STRL REUS W/TWL XL LVL3 (GOWN DISPOSABLE) ×4 IMPLANT
KIT BASIN OR (CUSTOM PROCEDURE TRAY) ×2 IMPLANT
KIT TURNOVER KIT A (KITS) IMPLANT
PENCIL SMOKE EVACUATOR (MISCELLANEOUS) IMPLANT
POUCH SPECIMEN RETRIEVAL 10MM (ENDOMECHANICALS) ×2 IMPLANT
RELOAD 45 VASCULAR/THIN (ENDOMECHANICALS) IMPLANT
RELOAD STAPLE 45 2.5 WHT GRN (ENDOMECHANICALS) IMPLANT
RELOAD STAPLE 45 3.5 BLU ETS (ENDOMECHANICALS) IMPLANT
RELOAD STAPLE TA45 3.5 REG BLU (ENDOMECHANICALS) ×2 IMPLANT
SCISSORS LAP 5X35 DISP (ENDOMECHANICALS) ×1 IMPLANT
SET IRRIG TUBING LAPAROSCOPIC (IRRIGATION / IRRIGATOR) ×2 IMPLANT
SET TUBE SMOKE EVAC HIGH FLOW (TUBING) ×2 IMPLANT
SHEARS HARMONIC ACE PLUS 36CM (ENDOMECHANICALS) ×1 IMPLANT
SLEEVE XCEL OPT CAN 5 100 (ENDOMECHANICALS) ×2 IMPLANT
SUT MNCRL AB 4-0 PS2 18 (SUTURE) ×2 IMPLANT
TOWEL OR 17X26 10 PK STRL BLUE (TOWEL DISPOSABLE) ×2 IMPLANT
TOWEL OR NON WOVEN STRL DISP B (DISPOSABLE) ×2 IMPLANT
TRAY LAPAROSCOPIC (CUSTOM PROCEDURE TRAY) ×2 IMPLANT
TROCAR BLADELESS OPT 5 100 (ENDOMECHANICALS) ×2 IMPLANT
TROCAR XCEL BLUNT TIP 100MML (ENDOMECHANICALS) ×2 IMPLANT

## 2019-11-04 NOTE — Anesthesia Procedure Notes (Signed)
Procedure Name: Intubation Date/Time: 11/04/2019 1:21 PM Performed by: Montel Clock, CRNA Pre-anesthesia Checklist: Patient identified, Emergency Drugs available, Suction available, Patient being monitored and Timeout performed Patient Re-evaluated:Patient Re-evaluated prior to induction Oxygen Delivery Method: Circle system utilized Preoxygenation: Pre-oxygenation with 100% oxygen Induction Type: IV induction, Rapid sequence and Cricoid Pressure applied Laryngoscope Size: Mac and 3 Grade View: Grade I Tube type: Oral Tube size: 7.0 mm Number of attempts: 1 Airway Equipment and Method: Stylet Placement Confirmation: ETT inserted through vocal cords under direct vision,  positive ETCO2 and breath sounds checked- equal and bilateral Secured at: 19 cm Tube secured with: Tape Dental Injury: Teeth and Oropharynx as per pre-operative assessment

## 2019-11-04 NOTE — Transfer of Care (Signed)
Immediate Anesthesia Transfer of Care Note  Patient: Amy Nunez  Procedure(s) Performed: APPENDECTOMY LAPAROSCOPIC (N/A Abdomen)  Patient Location: PACU  Anesthesia Type:General  Level of Consciousness: drowsy  Airway & Oxygen Therapy: Patient Spontanous Breathing and Patient connected to face mask oxygen  Post-op Assessment: Report given to RN and Post -op Vital signs reviewed and stable  Post vital signs: Reviewed and stable  Last Vitals:  Vitals Value Taken Time  BP 142/100 11/04/19 1416  Temp    Pulse 81 11/04/19 1422  Resp 15 11/04/19 1422  SpO2 100 % 11/04/19 1422  Vitals shown include unvalidated device data.  Last Pain:  Vitals:   11/04/19 1217  TempSrc: Oral  PainSc:          Complications: No apparent anesthesia complications

## 2019-11-04 NOTE — Anesthesia Preprocedure Evaluation (Addendum)
Anesthesia Evaluation  Patient identified by MRN, date of birth, ID band Patient awake    Reviewed: Allergy & Precautions, NPO status , Patient's Chart, lab work & pertinent test results  Airway Mallampati: II  TM Distance: >3 FB Neck ROM: Full    Dental  (+) Poor Dentition, Dental Advisory Given   Pulmonary neg pulmonary ROS, former smoker,    Pulmonary exam normal breath sounds clear to auscultation       Cardiovascular hypertension, Pt. on medications Normal cardiovascular exam Rhythm:Regular Rate:Normal  HLD  Last echo 08/2019: 1. Left ventricular ejection fraction, by visual estimation, is 60 to 65%. The left ventricle has normal function. There is mildly increased left ventricular hypertrophy.  2. Global right ventricle has normal systolic function.The right ventricular size is normal. No increase in right ventricular wall thickness.  3. Left atrial size was normal.  4. Right atrial size was normal.  5. The mitral valve is normal in structure. No evidence of mitral valve regurgitation.  6. The tricuspid valve is normal in structure.  7. The aortic valve is tricuspid. Aortic valve regurgitation is not visualized.  8. The pulmonic valve was not well visualized. Pulmonic valve regurgitation is not visualized.  9. The inferior vena cava is dilated in size with >50% respiratory variability, suggesting right atrial pressure of 8 mmHg.    Neuro/Psych negative neurological ROS  negative psych ROS   GI/Hepatic Neg liver ROS, Appendicitis IBS   Endo/Other  diabetes, Type 2, Insulin DependentObesity BMI 35  Renal/GU Renal InsufficiencyRenal diseaseCr 1.35 Hx renal ca s/p nephrectomy   negative genitourinary   Musculoskeletal negative musculoskeletal ROS (+)   Abdominal Normal abdominal exam  (+)   Peds  Hematology negative hematology ROS (+)   Anesthesia Other Findings Day of surgery medications reviewed with  the patient.  Reproductive/Obstetrics negative OB ROS                            Anesthesia Physical Anesthesia Plan  ASA: III  Anesthesia Plan: General   Post-op Pain Management:    Induction: Intravenous  PONV Risk Score and Plan: 4 or greater and Ondansetron, Dexamethasone, Midazolam, Scopolamine patch - Pre-op and Treatment may vary due to age or medical condition  Airway Management Planned: Oral ETT  Additional Equipment: None  Intra-op Plan:   Post-operative Plan: Extubation in OR  Informed Consent: I have reviewed the patients History and Physical, chart, labs and discussed the procedure including the risks, benefits and alternatives for the proposed anesthesia with the patient or authorized representative who has indicated his/her understanding and acceptance.     Dental advisory given  Plan Discussed with: CRNA  Anesthesia Plan Comments:         Anesthesia Quick Evaluation

## 2019-11-04 NOTE — Op Note (Signed)
APPENDECTOMY LAPAROSCOPIC  Procedure Note  Amy Nunez 11/04/2019   Pre-op Diagnosis: Appendicitis     Post-op Diagnosis: same  Procedure(s): APPENDECTOMY LAPAROSCOPIC  Surgeon(s): Coralie Keens, MD  Anesthesia: General  Staff:  Circulator: Anderson Malta, RN Scrub Person: Caprice Beaver, April C, CST; Clydene Laming, [facility]  Estimated Blood Loss: Minimal               Specimens: sent to path  Indications: This is a 59 year old female with multiple medical problems and chronic abdominal pain who was found on CT scan to have findings consistent with either acute appendicitis or a sending diverticulitis.  The decision was made to proceed to the operating room.         Findings: The patient appeared to have acute appendicitis.  There were a lot of adhesions from the ascending colon to the abdominal wall and from her prior right upper quadrant incision as well as small bowel fixated to the midline at several locations at the umbilicus.  Procedure: The patient was brought to the operating room identifies correct patient.  She is placed upon the operating table and general anesthesia was induced.  Her abdomen was prepped and draped in usual sterile fashion.  I made a small vertical incision above the umbilicus with a scalpel.  I carried this down to the fascia which was then opened the scalpel.  A hemostat was used to pass the peritoneal cavity under direct vision.  A 0 Vicryl pursestring suture was placed around the fascial opening.  The [facility] port was placed the opening and insufflation of the abdomen was begun.  I placed another 5 mm trocar in the patient's right upper quadrant.  I examined the abdomen and the patient is found to have multiple loops of small bowel fixated to the lower midline just below the umbilicus at the previous scar.  I was able to place a 5 mm trocar in direct vision the patient's left lower quadrant.  I did lyse a few adhesions with small bowel so adherent I decided to  leave this in place.  I examined the right colon and cecum.  There was a lot of adhesions of the right colon to the abdominal wall.  The appendix was coming off of the base of the cecum and going down to the retroperitoneum.  I was able to transect the base of the appendix with the laparoscopic GIA stapler.  I then took down the mesoappendix with the other my scalpel and pulled the appendix out of the retroperitoneum.  It appeared to be inflamed at the distal third.  Once the appendix was removed I placed in an Endosac and removed through the incision at the umbilicus.  I again evaluated the right lower quadrant and hemostasis peer to be achieved.  I irrigated the abdomen with normal saline.  Again hemostasis appeared to be achieved.  I reevaluated the small bowel below the umbilicus and there did not appear to be any evidence of bowel injury.  At this point all trochars were removed under direct vision and the abdomen was deflated.  I tied off the 0 Vicryl the umbilicus closing the fascial defect.  All incisions were anesthetized Marcaine and closed with 4-0 Monocryl sutures.  Dermabond was applied.  Patient tolerated procedure well.  All the counts were correct at the end the procedure.  Patient was then extubated in the operating room and taken in stable addition to the recovery room. Coralie Keens   Date: 11/04/2019  Time:  2:07 PM

## 2019-11-04 NOTE — Progress Notes (Signed)
VAST consulted to assess IV X 2; RN reports one IV is difficult to flush and one IV will not flush at all. Assessed RA AC IV which was leaking and burning; discontinued IV. Assessed LUA IV which was kinked; discontinued IV.  Assessed both upper extremities with ultrasound after attempting IV placement x2. There were no vessels large enough or shallow enough to place USGIV. This RN untrained to place midlines/extended dwells. Navreet, pt's RN notified; she will contact physician for alternate access.

## 2019-11-04 NOTE — Discharge Instructions (Signed)
[facility]  Please arrive at least 30 min before your appointment to complete your check in paperwork.  If you are unable to arrive 30 min prior to your appointment time we may have to cancel or reschedule you. LAPAROSCOPIC SURGERY: POST OP INSTRUCTIONS Always review your discharge instruction sheet given to you by the facility where your surgery was performed. IF YOU HAVE DISABILITY OR FAMILY LEAVE FORMS, YOU MUST BRING THEM TO THE OFFICE FOR PROCESSING.   DO NOT GIVE THEM TO YOUR DOCTOR.  PAIN CONTROL  1. First take acetaminophen (Tylenol) AND/or ibuprofen (Advil) to control your pain after surgery.  Follow directions on package.  Taking acetaminophen (Tylenol) and/or ibuprofen (Advil) regularly after surgery will help to control your pain and lower the amount of prescription pain medication you may need.  You should not take more than 4,000 mg (4 grams) of acetaminophen (Tylenol) in 24 hours.  You should not take ibuprofen (Advil), aleve, motrin, naprosyn or other NSAIDS if you have a history of stomach ulcers or chronic kidney disease.  2. A prescription for pain medication may be given to you upon discharge.  Take your pain medication as prescribed, if you still have uncontrolled pain after taking acetaminophen (Tylenol) or ibuprofen (Advil). 3. Use ice packs to help control pain. 4. If you need a refill on your pain medication, please contact your pharmacy.  They will contact our office to request authorization. Prescriptions will not be filled after 5pm or on week-ends.  HOME MEDICATIONS 5. Take your usually prescribed medications unless otherwise directed.  DIET 6. You should follow a light diet the first few days after arrival home.  Be sure to include lots of fluids daily. Avoid fatty, fried foods.   CONSTIPATION 7. It is common to experience some constipation after surgery and if you are taking pain medication.  Increasing fluid intake and taking a stool  softener (such as Colace) will usually help or prevent this problem from occurring.  A mild laxative (Milk of Magnesia or Miralax) should be taken according to package instructions if there are no bowel movements after 48 hours.  WOUND/INCISION CARE 8. Most patients will experience some swelling and bruising in the area of the incisions.  Ice packs will help.  Swelling and bruising can take several days to resolve.  9. Unless discharge instructions indicate otherwise, follow guidelines below  a. STERI-STRIPS - you may remove your outer bandages 48 hours after surgery, and you may shower at that time.  You have steri-strips (small skin tapes) in place directly over the incision.  These strips should be left on the skin for 7-10 days.   b. DERMABOND/SKIN GLUE - you may shower in 24 hours.  The glue will flake off over the next 2-3 weeks. 10. Any sutures or staples will be removed at the office during your follow-up visit.  ACTIVITIES 11. You may resume regular (light) daily activities beginning the next day--such as daily self-care, walking, climbing stairs--gradually increasing activities as tolerated.  You may have sexual intercourse when it is comfortable.  Refrain from any heavy lifting or straining until approved by your doctor. a. You may drive when you are no longer taking prescription pain medication, you can comfortably wear a seatbelt, and you can safely maneuver your car and apply brakes.  FOLLOW-UP 12. You should see your doctor in the office for a follow-up appointment approximately 2-3 weeks after your surgery.  You should have been given your post-op/follow-up appointment when   your surgery was scheduled.  If you did not receive a post-op/follow-up appointment, make sure that you call for this appointment within a day or two after you arrive home to insure a convenient appointment time.   WHEN TO CALL YOUR DOCTOR: 1. Fever over 101.0 2. Inability to urinate 3. Continued bleeding from  incision. 4. Increased pain, redness, or drainage from the incision. 5. Increasing abdominal pain  The clinic staff is available to answer your questions during regular business hours.  Please don't hesitate to call and ask to speak to one of the nurses for clinical concerns.  If you have a medical emergency, go to the nearest emergency room or call 911.  A surgeon from [facility] is always on call at the hospital. [address], [address], [place], [place]  [address] ? [address] [address], [place],    [address] [phone] ? [phone] ? FAX [phone]  .........   Managing Your Pain After Surgery Without Opioids    Thank you for participating in our program to help patients manage their pain after surgery without opioids. This is part of our effort to provide you with the best care possible, without exposing you or your family to the risk that opioids pose.  What pain can I expect after surgery? You can expect to have some pain after surgery. This is normal. The pain is typically worse the day after surgery, and quickly begins to get better. Many studies have found that many patients are able to manage their pain after surgery with Over-the-Counter (OTC) medications such as Tylenol and Motrin. If you have a condition that does not allow you to take Tylenol or Motrin, notify your surgical team.  How will I manage my pain? The best strategy for controlling your pain after surgery is around the clock pain control with Tylenol (acetaminophen) and Motrin (ibuprofen or Advil). Alternating these medications with each other allows you to maximize your pain control. In addition to Tylenol and Motrin, you can use heating pads or ice packs on your incisions to help reduce your pain.  How will I alternate your regular strength over-the-counter pain medication? You will take a dose of pain medication every three hours. ; Start by taking 650 mg of Tylenol (2 pills of 325  mg) ; 3 hours later take 600 mg of Motrin (3 pills of 200 mg) ; 3 hours after taking the Motrin take 650 mg of Tylenol ; 3 hours after that take 600 mg of Motrin.   - 1 -  See example - if your first dose of Tylenol is at 12:00 PM   12:00 PM Tylenol 650 mg (2 pills of 325 mg)  3:00 PM Motrin 600 mg (3 pills of 200 mg)  6:00 PM Tylenol 650 mg (2 pills of 325 mg)  9:00 PM Motrin 600 mg (3 pills of 200 mg)  Continue alternating every 3 hours   We recommend that you follow this schedule around-the-clock for at least 3 days after surgery, or until you feel that it is no longer needed. Use the table on the last page of this handout to keep track of the medications you are taking. Important: Do not take more than 3000mg of Tylenol or 3200mg of Motrin in a 24-hour period. Do not take ibuprofen/Motrin if you have a history of bleeding stomach ulcers, severe kidney disease, &/or actively taking a blood thinner  What if I still have pain? If you have pain that is not   controlled with the over-the-counter pain medications (Tylenol and Motrin or Advil) you might have what we call "breakthrough" pain. You will receive a prescription for a small amount of an opioid pain medication such as Oxycodone, Tramadol, or Tylenol with Codeine. Use these opioid pills in the first 24 hours after surgery if you have breakthrough pain. Do not take more than 1 pill every 4-6 hours.  If you still have uncontrolled pain after using all opioid pills, don't hesitate to call our staff using the number provided. We will help make sure you are managing your pain in the best way possible, and if necessary, we can provide a prescription for additional pain medication.   Day 1    Time  Name of Medication Number of pills taken  Amount of Acetaminophen  Pain Level   Comments  AM PM       AM PM       AM PM       AM PM       AM PM       AM PM       AM PM       AM PM       Total Daily amount of Acetaminophen Do not  take more than  3,000 mg per day      Day 2    Time  Name of Medication Number of pills taken  Amount of Acetaminophen  Pain Level   Comments  AM PM       AM PM       AM PM       AM PM       AM PM       AM PM       AM PM       AM PM       Total Daily amount of Acetaminophen Do not take more than  3,000 mg per day      Day 3    Time  Name of Medication Number of pills taken  Amount of Acetaminophen  Pain Level   Comments  AM PM       AM PM       AM PM       AM PM          AM PM       AM PM       AM PM       AM PM       Total Daily amount of Acetaminophen Do not take more than  3,000 mg per day      Day 4    Time  Name of Medication Number of pills taken  Amount of Acetaminophen  Pain Level   Comments  AM PM       AM PM       AM PM       AM PM       AM PM       AM PM       AM PM       AM PM       Total Daily amount of Acetaminophen Do not take more than  3,000 mg per day      Day 5    Time  Name of Medication Number of pills taken  Amount of Acetaminophen  Pain Level   Comments  AM PM       AM PM       AM   PM       AM PM       AM PM       AM PM       AM PM       AM PM       Total Daily amount of Acetaminophen Do not take more than  3,000 mg per day       Day 6    Time  Name of Medication Number of pills taken  Amount of Acetaminophen  Pain Level  Comments  AM PM       AM PM       AM PM       AM PM       AM PM       AM PM       AM PM       AM PM       Total Daily amount of Acetaminophen Do not take more than  3,000 mg per day      Day 7    Time  Name of Medication Number of pills taken  Amount of Acetaminophen  Pain Level   Comments  AM PM       AM PM       AM PM       AM PM       AM PM       AM PM       AM PM       AM PM       Total Daily amount of Acetaminophen Do not take more than  3,000 mg per day        For additional information about how and where to safely dispose of unused  opioid medications - https://www.morepowerfulnc.org  Disclaimer: This document contains information and/or instructional materials adapted from [facility] for the typical patient with your condition. It does not replace medical advice from your health care provider because your experience may differ from that of the typical patient. Talk to your health care provider if you have any questions about this document, your condition or your treatment plan. Adapted from [facility]   

## 2019-11-04 NOTE — Progress Notes (Signed)
Patient ID: Promiss Carmelina Noun, female   DOB: 1961/02/21, 59 y.o.   MRN: [id number]   Pre Procedure note for inpatients:   Kieara J Yorio has been scheduled for Procedure(s): APPENDECTOMY LAPAROSCOPIC (N/A) today. The various methods of treatment have been discussed with the patient. After consideration of the risks, benefits and treatment options the patient has consented to the planned procedure. We also discussed that this may be diverticulitis.  The patient has been seen and labs reviewed. There are no changes in the patient's condition to prevent proceeding with the planned procedure today.  Recent labs:  Lab Results  Component Value Date   WBC 18.1 (H) 11/04/2019   HGB 12.3 11/04/2019   HCT 34.6 (L) 11/04/2019   PLT 352 11/04/2019   GLUCOSE 236 (H) 11/04/2019   CHOL 151 09/02/2019   TRIG 94 09/02/2019   HDL 32 (L) 09/02/2019   LDLCALC 100 (H) 09/02/2019   ALT 40 11/03/2019   AST 32 11/03/2019   NA 136 11/04/2019   K 4.0 11/04/2019   CL 104 11/04/2019   CREATININE 1.35 (H) 11/04/2019   BUN 20 11/04/2019   CO2 17 (L) 11/04/2019   INR 1.0 09/01/2019   HGBA1C 8.4 (H) 09/02/2019    Coralie Keens, MD 11/04/2019 8:07 AM

## 2019-11-04 NOTE — Progress Notes (Signed)
Pt c/o being sick to her stomach/ nauseous, but does not have any orders for anti-nausea medication. Contact DR. Matt Wakefield CCS. New orders for Zofran 4mg  Q8hrs prn for nausea/ vomiting.

## 2019-11-04 NOTE — Progress Notes (Signed)
Notified Dr. Ninfa Linden patient has orders for cardiac monitoring and Patient is a diabetic with no orders to check her blood sugars

## 2019-11-05 ENCOUNTER — Encounter: Payer: Self-pay | Admitting: *Deleted

## 2019-11-05 DIAGNOSIS — N179 Acute kidney failure, unspecified: Secondary | ICD-10-CM | POA: Diagnosis present

## 2019-11-05 DIAGNOSIS — Z905 Acquired absence of kidney: Secondary | ICD-10-CM | POA: Diagnosis not present

## 2019-11-05 DIAGNOSIS — N1832 Chronic kidney disease, stage 3b: Secondary | ICD-10-CM | POA: Diagnosis present

## 2019-11-05 DIAGNOSIS — Z87891 Personal history of nicotine dependence: Secondary | ICD-10-CM | POA: Diagnosis not present

## 2019-11-05 DIAGNOSIS — K660 Peritoneal adhesions (postprocedural) (postinfection): Secondary | ICD-10-CM | POA: Diagnosis present

## 2019-11-05 DIAGNOSIS — Z20822 Contact with and (suspected) exposure to covid-19: Secondary | ICD-10-CM | POA: Diagnosis present

## 2019-11-05 DIAGNOSIS — K37 Unspecified appendicitis: Secondary | ICD-10-CM | POA: Diagnosis present

## 2019-11-05 DIAGNOSIS — K589 Irritable bowel syndrome without diarrhea: Secondary | ICD-10-CM | POA: Diagnosis present

## 2019-11-05 DIAGNOSIS — F209 Schizophrenia, unspecified: Secondary | ICD-10-CM | POA: Diagnosis present

## 2019-11-05 DIAGNOSIS — R112 Nausea with vomiting, unspecified: Secondary | ICD-10-CM | POA: Diagnosis present

## 2019-11-05 DIAGNOSIS — Z880 Allergy status to penicillin: Secondary | ICD-10-CM | POA: Diagnosis not present

## 2019-11-05 DIAGNOSIS — N189 Chronic kidney disease, unspecified: Secondary | ICD-10-CM | POA: Diagnosis not present

## 2019-11-05 DIAGNOSIS — I959 Hypotension, unspecified: Secondary | ICD-10-CM | POA: Diagnosis present

## 2019-11-05 DIAGNOSIS — G8929 Other chronic pain: Secondary | ICD-10-CM | POA: Diagnosis present

## 2019-11-05 DIAGNOSIS — I129 Hypertensive chronic kidney disease with stage 1 through stage 4 chronic kidney disease, or unspecified chronic kidney disease: Secondary | ICD-10-CM | POA: Diagnosis present

## 2019-11-05 DIAGNOSIS — R109 Unspecified abdominal pain: Secondary | ICD-10-CM | POA: Diagnosis present

## 2019-11-05 DIAGNOSIS — Z85528 Personal history of other malignant neoplasm of kidney: Secondary | ICD-10-CM | POA: Diagnosis not present

## 2019-11-05 DIAGNOSIS — E1122 Type 2 diabetes mellitus with diabetic chronic kidney disease: Secondary | ICD-10-CM | POA: Diagnosis present

## 2019-11-05 DIAGNOSIS — Z79899 Other long term (current) drug therapy: Secondary | ICD-10-CM | POA: Diagnosis not present

## 2019-11-05 DIAGNOSIS — E1165 Type 2 diabetes mellitus with hyperglycemia: Secondary | ICD-10-CM | POA: Diagnosis present

## 2019-11-05 LAB — GLUCOSE, CAPILLARY
Glucose-Capillary: 222 mg/dL — ABNORMAL HIGH (ref 70–99)
Glucose-Capillary: 159 mg/dL — ABNORMAL HIGH (ref 70–99)
Glucose-Capillary: 129 mg/dL — ABNORMAL HIGH (ref 70–99)

## 2019-11-05 LAB — BASIC METABOLIC PANEL
Anion gap: 12 (ref 5–15)
BUN: 23 mg/dL — ABNORMAL HIGH (ref 6–20)
CO2: 19 mmol/L — ABNORMAL LOW (ref 22–32)
Calcium: 7.8 mg/dL — ABNORMAL LOW (ref 8.9–10.3)
Chloride: 103 mmol/L (ref 98–111)
Creatinine, Ser: 1.29 mg/dL — ABNORMAL HIGH (ref 0.44–1.00)
GFR calc Af Amer: 52 mL/min — ABNORMAL LOW (ref >60)
GFR calc non Af Amer: 45 mL/min — ABNORMAL LOW (ref >60)
Glucose, Bld: 192 mg/dL — ABNORMAL HIGH (ref 70–99)
Potassium: 3.7 mmol/L (ref 3.5–5.1)
Sodium: 134 mmol/L — ABNORMAL LOW (ref 135–145)

## 2019-11-05 LAB — SURGICAL PATHOLOGY

## 2019-11-05 LAB — CBC
HCT: 29.3 % — ABNORMAL LOW (ref 36.0–46.0)
Hemoglobin: 10.6 g/dL — ABNORMAL LOW (ref 12.0–15.0)
MCH: 31.1 pg (ref 26.0–34.0)
MCHC: 36.2 g/dL — ABNORMAL HIGH (ref 30.0–36.0)
MCV: 85.9 fL (ref 80.0–100.0)
Platelets: 359 10*3/uL (ref 150–400)
RBC: 3.41 MIL/uL — ABNORMAL LOW (ref 3.87–5.11)
RDW: 12.1 % (ref 11.5–15.5)
WBC: 16.5 10*3/uL — ABNORMAL HIGH (ref 4.0–10.5)
nRBC: 0 % (ref 0.0–0.2)

## 2019-11-05 MED ORDER — INSULIN ASPART 100 UNIT/ML ~~LOC~~ SOLN
0.0000 [IU] | Freq: Three times a day (TID) | SUBCUTANEOUS | Status: DC
  Administered 2019-11-05: 2 [IU] via SUBCUTANEOUS
  Administered 2019-11-05 – 2019-11-06 (×2): 3 [IU] via SUBCUTANEOUS
  Administered 2019-11-06 (×2): 2 [IU] via SUBCUTANEOUS
  Administered 2019-11-07: 1 [IU] via SUBCUTANEOUS
  Administered 2019-11-07: 2 [IU] via SUBCUTANEOUS
  Administered 2019-11-07: 3 [IU] via SUBCUTANEOUS
  Administered 2019-11-08: 2 [IU] via SUBCUTANEOUS

## 2019-11-05 MED ORDER — SPIRONOLACTONE 25 MG PO TABS
50.0000 mg | ORAL_TABLET | Freq: Every day | ORAL | Status: DC
  Administered 2019-11-05: 50 mg via ORAL
  Filled 2019-11-05 (×2): qty 2

## 2019-11-05 MED ORDER — QUETIAPINE FUMARATE 50 MG PO TABS
400.0000 mg | ORAL_TABLET | Freq: Every day | ORAL | Status: DC
  Administered 2019-11-05 – 2019-11-07 (×3): 400 mg via ORAL
  Filled 2019-11-05: qty 8
  Filled 2019-11-05: qty 16
  Filled 2019-11-05 (×2): qty 8

## 2019-11-05 MED ORDER — INSULIN GLARGINE 100 UNIT/ML ~~LOC~~ SOLN
8.0000 [IU] | Freq: Every day | SUBCUTANEOUS | Status: DC
  Administered 2019-11-05 – 2019-11-07 (×3): 8 [IU] via SUBCUTANEOUS
  Filled 2019-11-05 (×4): qty 0.08

## 2019-11-05 MED ORDER — POLYETHYLENE GLYCOL 3350 17 G PO PACK
17.0000 g | PACK | Freq: Every day | ORAL | Status: DC
  Administered 2019-11-05 – 2019-11-06 (×2): 17 g via ORAL
  Filled 2019-11-05 (×2): qty 1

## 2019-11-05 MED ORDER — ACETAMINOPHEN 500 MG PO TABS
1000.0000 mg | ORAL_TABLET | Freq: Four times a day (QID) | ORAL | Status: DC
  Administered 2019-11-05 – 2019-11-08 (×11): 1000 mg via ORAL
  Filled 2019-11-05 (×11): qty 2

## 2019-11-05 MED ORDER — IBUPROFEN 400 MG PO TABS: 600.0000 mg | ORAL_TABLET | Freq: Three times a day (TID) | ORAL | Status: DC

## 2019-11-05 MED ORDER — TIZANIDINE HCL 4 MG PO TABS
4.0000 mg | ORAL_TABLET | Freq: Three times a day (TID) | ORAL | Status: DC | PRN
  Administered 2019-11-05 – 2019-11-07 (×2): 4 mg via ORAL
  Filled 2019-11-05 (×2): qty 1

## 2019-11-05 MED ORDER — ATORVASTATIN CALCIUM 40 MG PO TABS
80.0000 mg | ORAL_TABLET | Freq: Every day | ORAL | Status: DC
  Administered 2019-11-05 – 2019-11-08 (×4): 80 mg via ORAL
  Filled 2019-11-05 (×4): qty 2

## 2019-11-05 MED ORDER — MORPHINE SULFATE ER 30 MG PO TBCR
30.0000 mg | EXTENDED_RELEASE_TABLET | Freq: Two times a day (BID) | ORAL | Status: DC
  Administered 2019-11-05 – 2019-11-08 (×7): 30 mg via ORAL
  Filled 2019-11-05 (×8): qty 1

## 2019-11-05 MED ORDER — QUETIAPINE FUMARATE 50 MG PO TABS: 200.0000 mg | ORAL_TABLET | ORAL | Status: DC

## 2019-11-05 MED ORDER — INSULIN GLARGINE 100 UNIT/ML ~~LOC~~ SOLN: 5.0000 [IU] | Freq: Every day | SUBCUTANEOUS | Status: DC

## 2019-11-05 MED ORDER — LISINOPRIL 20 MG PO TABS
40.0000 mg | ORAL_TABLET | Freq: Every day | ORAL | Status: DC
  Administered 2019-11-05: 40 mg via ORAL
  Filled 2019-11-05: qty 4

## 2019-11-05 MED ORDER — AMLODIPINE BESYLATE 5 MG PO TABS
5.0000 mg | ORAL_TABLET | Freq: Every day | ORAL | Status: DC
  Administered 2019-11-05: 10:00:00 5 mg via ORAL
  Filled 2019-11-05: qty 1

## 2019-11-05 MED ORDER — QUETIAPINE FUMARATE 50 MG PO TABS
200.0000 mg | ORAL_TABLET | Freq: Every day | ORAL | Status: DC
  Administered 2019-11-05 – 2019-11-08 (×4): 200 mg via ORAL
  Filled 2019-11-05 (×4): qty 4

## 2019-11-05 MED ORDER — ENOXAPARIN SODIUM 40 MG/0.4ML ~~LOC~~ SOLN
40.0000 mg | SUBCUTANEOUS | Status: DC
  Administered 2019-11-05: 40 mg via SUBCUTANEOUS
  Filled 2019-11-05: qty 0.4

## 2019-11-05 MED ORDER — INSULIN ASPART 100 UNIT/ML ~~LOC~~ SOLN
0.0000 [IU] | Freq: Every day | SUBCUTANEOUS | Status: DC
  Administered 2019-11-06: 2 [IU] via SUBCUTANEOUS

## 2019-11-05 MED ORDER — GABAPENTIN 400 MG PO CAPS
800.0000 mg | ORAL_CAPSULE | Freq: Two times a day (BID) | ORAL | Status: DC | PRN
  Administered 2019-11-05 – 2019-11-08 (×2): 800 mg via ORAL
  Filled 2019-11-05 (×2): qty 2

## 2019-11-05 NOTE — Progress Notes (Signed)
Inpatient Diabetes Program Recommendations  AACE/ADA: New Consensus Statement on Inpatient Glycemic Control (2015)  Target Ranges:  Prepandial:   less than 140 mg/dL      Peak postprandial:   less than 180 mg/dL (1-2 hours)      Critically ill patients:  140 - 180 mg/dL   Lab Results  Component Value Date   GLUCAP 257 (H) 11/04/2019   HGBA1C 8.4 (H) 09/02/2019    Review of Glycemic Control Results for Amy Nunez, Amy Nunez (MRN [id number]) as of 11/05/2019 10:16  Ref. Range 11/03/2019 13:58 11/04/2019 12:15 11/04/2019 14:29 11/04/2019 20:38  Glucose-Capillary Latest Ref Range: 70 - 99 mg/dL 275 (H) 216 (H) 206 (H) 257 (H)  Results for Amy Nunez, Amy Nunez (MRN [id number]) as of 11/05/2019 10:16  Ref. Range 11/05/2019 09:29  Glucose Latest Ref Range: 70 - 99 mg/dL 192 (H)   Diabetes history: DM 2 Outpatient Diabetes medications:  Lantus 5 units q HS Current orders for Inpatient glycemic control:  Lantus 5 units daily  Inpatient Diabetes Program Recommendations:    Note that patient did receive Decadron 5 mg in OR.  This could contribute to increased CBG's.  Recommend increasing Lantus to 8 units q HS and add Novolog sensitive correction tid with meals.   Thanks Adah Perl, RN, BC-ADM Inpatient Diabetes Coordinator Pager [phone] (8a-5p)

## 2019-11-05 NOTE — Progress Notes (Addendum)
Patient ID: Amy Nunez, female   DOB: December 07, 1960, 59 y.o.   MRN: [id number]    1 Day Post-Op  Subjective: Patient still complains of abdominal pain.  She states it is the same as prior to surgery. (has history of chronic abdominal pain)  She is "spitting up" but no emesis.  She is tolerating her CLDs.  She states she has passed some flatus as well.  She is rolling around in bed and difficult to keep focus.  Was putting paper in her mouth after surgery yesterday and sticking her finger down her throat telling the staff, "I need to make myself sick"  ROS: See above, otherwise other systems negative  Objective: Vital signs in last 24 hours: Temp:  [97.5 F (36.4 C)-99.3 F (37.4 C)] 99.3 F (37.4 C) (03/10 0610) Pulse Rate:  [54-86] 71 (03/10 0610) Resp:  [9-23] 18 (03/10 0610) BP: (140-199)/(82-117) 140/82 (03/10 0610) SpO2:  [99 %-100 %] 99 % (03/10 0610) Last BM Date: 11/03/19  Intake/Output from previous day: 03/09 0701 - 03/10 0700 In: 2773.3 [P.O.:600; I.V.:1973.3; IV Piggyback:200] Out: 1810 [Urine:1800; Blood:10] Intake/Output this shift: No intake/output data recorded.  PE: Gen: NAD, but restless HEENT: PERRL NecK: no thyromegaly Heart: regular Lungs: CTAB Abd: soft, appropriately tender, +BS, incisions c/d/i MS: no cyanosis or edema, normal ROM Skin: warm and dry Psych: restless in bed and with scattered ideas.  Lab Results:  Recent Labs    11/04/19 0501 11/05/19 0725  WBC 18.1* 16.5*  HGB 12.3 10.6*  HCT 34.6* 29.3*  PLT 352 359   BMET Recent Labs    11/03/19 1513 11/04/19 0501  NA 132* 136  K 4.1 4.0  CL 100 104  CO2 18* 17*  GLUCOSE 306* 236*  BUN 24* 20  CREATININE 1.53* 1.35*  CALCIUM 9.5 9.0   PT/INR No results for input(s): LABPROT, INR in the last 72 hours. CMP     Component Value Date/Time   NA 136 11/04/2019 0501   K 4.0 11/04/2019 0501   CL 104 11/04/2019 0501   CO2 17 (L) 11/04/2019 0501   GLUCOSE 236 (H) 11/04/2019 0501     BUN 20 11/04/2019 0501   CREATININE 1.35 (H) 11/04/2019 0501   CALCIUM 9.0 11/04/2019 0501   PROT 8.1 11/03/2019 1513   ALBUMIN 4.3 11/03/2019 1513   AST 32 11/03/2019 1513   ALT 40 11/03/2019 1513   ALKPHOS 102 11/03/2019 1513   BILITOT 0.9 11/03/2019 1513   GFRNONAA 43 (L) 11/04/2019 0501   GFRAA 49 (L) 11/04/2019 0501   Lipase     Component Value Date/Time   LIPASE 63 (H) 11/03/2019 1513       Studies/Results: CT ABDOMEN PELVIS W CONTRAST  Result Date: 11/03/2019 CLINICAL DATA:  Abdominal pain and neutropenia. EXAM: CT ABDOMEN AND PELVIS WITH CONTRAST TECHNIQUE: Multidetector CT imaging of the abdomen and pelvis was performed using the standard protocol following bolus administration of intravenous contrast. Automatic exposure control utilized. CONTRAST:  35mL OMNIPAQUE IOHEXOL 300 MG/ML  SOLN COMPARISON:  September 18, 2019. FINDINGS: Lower chest: Mild cardiomegaly with mild right main and left circumflex coronary calcification. Central pulmonary vascular congestion without pulmonary edema or pleural effusion. Thin linear atelectasis in the lung bases. Moderately sized hiatal hernia. Hepatobiliary: Fatty infiltration of the liver. Normal hepatic size and normal smooth hepatic contours. Decompressed and otherwise normal gallbladder. Normal biliary tree. Pancreas: Mild atrophy. Patient motion degrades image quality in this region and subtle inflammatory changes not excluded. There is no  apparent dilatation of the main pancreatic duct or sequela of chronic pancreatitis. Spleen: Small spleen measuring 7.6 x 4.6 cm in the axial plane. Normal splenic contours. Adrenals/Urinary Tract: Normal bilateral adrenal glands. Right nephrectomy with nonspecific asymmetrically increased size of the right psoas along the course of the right ureter and surgical clips that could be metastatic invasion of the musculature versus a benign etiology. Normal size and cortical thickness of the left kidney with a  complicated 2.5 cm lower pole cortical cyst with focal aches shell type calcification on the lateral periphery and 13 Hounsfield unit density internally. Stomach/Bowel: Abnormally dilated fluid field retrocecal appendix measuring 10 mm diameter with adjacent mild inflammatory change medial to the right iliac vessels on axial series 2, image 58 and sagittal series 6, image 49. Nonobstructed. Diverticulosis in the proximal and distal colon with an acute mild right lower quadrant diverticulitis involving the ascending colon and cecum. No associated perforation or abscess. Adhesion formation of the duodenal C loop to the adjacent small bowel loops and right psoas in the nephrectomy bed. Vascular/Lymphatic: Small right lower quadrant mesenteric lymph nodes, likely reactive. Thoracoabdominal aorta and bilateral iliac calcified atherosclerosis without an abdominal aortic aneurysm. 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 Reproductive: Hysterectomy. No adnexal cyst or mass. Other: No free intraperitoneal fluid or air. Musculoskeletal: Atrophy and diastasis of the abdominal rectus musculature. Moderate skeletal degenerative change and mild bone demineralization. Critical Value/emergent results were called by telephone at the time of interpretation on 11/03/2019 at 7:22 pm to provider [facility] , who verbally acknowledged these results. IMPRESSION: 1. Acute uncomplicated appendicitis and mild uncomplicated diverticulitis of the ascending colon. 2. Right nephrectomy with nonspecific asymmetrically increased size of the right psoas along the course of the right ureter and surgical clips that could be metastatic invasion of the musculature versus a benign etiology. Additional nonemergent outpatient MR abdomen pelvis with intravenous contrast recommended to assess for any suspicious muscle enhancement. Correlation with surgical history may be useful. 3. Adhesion formation of the  duodenal C loop to the adjacent small bowel loops and right psoas in the nephrectomy bed without bowel obstruction. 4. A complicated left renal midpole cyst. 5. Fatty infiltration of the liver. 6. Moderately sized hiatal hernia. 7. Mild cardiomegaly with mild right main and left circumflex coronary artery disease. Aortic Atherosclerosis (ICD10-I70.0). Electronically Signed   By: Revonda Humphrey   On: 11/03/2019 19:23    Anti-infectives: Anti-infectives (From admission, onward)   Start     Dose/Rate Route Frequency Ordered Stop   11/04/19 0800  ceFEPIme (MAXIPIME) 2 g in sodium chloride 0.9 % 100 mL IVPB  Status:  Discontinued     2 g 200 mL/hr over 30 Minutes Intravenous 2 times daily 11/03/19 2256 11/04/19 1635   11/04/19 0600  metroNIDAZOLE (FLAGYL) IVPB 500 mg  Status:  Discontinued     500 mg 100 mL/hr over 60 Minutes Intravenous Every 8 hours 11/03/19 2256 11/04/19 1635   11/03/19 1945  ceFEPIme (MAXIPIME) 2 g in sodium chloride 0.9 % 100 mL IVPB     2 g 200 mL/hr over 30 Minutes Intravenous  Once 11/03/19 1934 11/03/19 2048   11/03/19 1945  metroNIDAZOLE (FLAGYL) IVPB 500 mg     500 mg 100 mL/hr over 60 Minutes Intravenous  Once 11/03/19 1934 11/03/19 2157       Assessment/Plan Chronic kidney disease - baseline appears around 1.15.  Was 1.34 yesterday.  Check BMET today Schizophrenia -  restart home meds Chronic abdominal pain - resume home meds DM - CBGs elevated, resume home meds, but have diabetes coordinator evaluate the patient for further needs HTN - resume home meds  POD 1, s/p lap appy -patient states she is still having abdominal pain, but she is difficult to pin down to determine if this is post op pain or the same pain she truly had before surgery. -resume home pain meds, MS contin 30mg  BID, neurontin 800mg  BID, oxy prn, tylenol scheduled.  Hold ibuprofen due to kidney disease -mobilize -adv to soft diet today -likely home tomorrow -WBC decrease from 18 to 16.   Recheck in am   FEN - soft diet, SLIV unless Cr up then will rehydrate VTE - lovenox ID - none   LOS: 0 days    Henreitta Cea , [facility] Surgery 11/05/2019, 8:27 AM Please see Amion for pager number during day hours 7:00am-4:30pm or 7:00am -11:30am on weekends

## 2019-11-05 NOTE — Anesthesia Postprocedure Evaluation (Signed)
Anesthesia Post Note  Patient: Amy Nunez  Procedure(s) Performed: APPENDECTOMY LAPAROSCOPIC (N/A Abdomen)     Patient location during evaluation: PACU Anesthesia Type: General Level of consciousness: awake and alert Pain management: pain level controlled Vital Signs Assessment: post-procedure vital signs reviewed and stable Respiratory status: spontaneous breathing, nonlabored ventilation, respiratory function stable and patient connected to nasal cannula oxygen Cardiovascular status: blood pressure returned to baseline and stable Postop Assessment: no apparent nausea or vomiting Anesthetic complications: no    Last Vitals:  Vitals:   11/05/19 0138 11/05/19 0610  BP: (!) 173/94 140/82  Pulse: 64 71  Resp: 16 18  Temp: 37.1 C 37.4 C  SpO2: 100% 99%    Last Pain:  Vitals:   11/05/19 0610  TempSrc: Oral  PainSc:                  Pervis Hocking

## 2019-11-05 NOTE — Progress Notes (Signed)
Patient refused to walk in hallway and get out the bed into the chair.

## 2019-11-06 ENCOUNTER — Encounter (HOSPITAL_COMMUNITY): Payer: Self-pay | Admitting: Surgery

## 2019-11-06 DIAGNOSIS — N179 Acute kidney failure, unspecified: Secondary | ICD-10-CM

## 2019-11-06 DIAGNOSIS — N189 Chronic kidney disease, unspecified: Secondary | ICD-10-CM

## 2019-11-06 LAB — CBC
HCT: 31.8 % — ABNORMAL LOW (ref 36.0–46.0)
Hemoglobin: 10.7 g/dL — ABNORMAL LOW (ref 12.0–15.0)
MCH: 31.7 pg (ref 26.0–34.0)
MCHC: 33.6 g/dL (ref 30.0–36.0)
MCV: 94.1 fL (ref 80.0–100.0)
Platelets: 324 10*3/uL (ref 150–400)
RBC: 3.38 MIL/uL — ABNORMAL LOW (ref 3.87–5.11)
RDW: 12.7 % (ref 11.5–15.5)
WBC: 13 10*3/uL — ABNORMAL HIGH (ref 4.0–10.5)
nRBC: 0 % (ref 0.0–0.2)

## 2019-11-06 LAB — COMPREHENSIVE METABOLIC PANEL
ALT: 34 U/L (ref 0–44)
AST: 34 U/L (ref 15–41)
Albumin: 3.4 g/dL — ABNORMAL LOW (ref 3.5–5.0)
Alkaline Phosphatase: 65 U/L (ref 38–126)
Anion gap: 11 (ref 5–15)
BUN: 28 mg/dL — ABNORMAL HIGH (ref 6–20)
CO2: 20 mmol/L — ABNORMAL LOW (ref 22–32)
Calcium: 7.9 mg/dL — ABNORMAL LOW (ref 8.9–10.3)
Chloride: 102 mmol/L (ref 98–111)
Creatinine, Ser: 2.48 mg/dL — ABNORMAL HIGH (ref 0.44–1.00)
GFR calc Af Amer: 24 mL/min — ABNORMAL LOW (ref >60)
GFR calc non Af Amer: 20 mL/min — ABNORMAL LOW (ref >60)
Glucose, Bld: 176 mg/dL — ABNORMAL HIGH (ref 70–99)
Potassium: 3.5 mmol/L (ref 3.5–5.1)
Sodium: 133 mmol/L — ABNORMAL LOW (ref 135–145)
Total Bilirubin: 0.7 mg/dL (ref 0.3–1.2)
Total Protein: 6.2 g/dL — ABNORMAL LOW (ref 6.5–8.1)

## 2019-11-06 LAB — URINALYSIS, ROUTINE W REFLEX MICROSCOPIC
Bilirubin Urine: NEGATIVE
Glucose, UA: NEGATIVE mg/dL
Hgb urine dipstick: NEGATIVE
Ketones, ur: NEGATIVE mg/dL
Nitrite: NEGATIVE
Protein, ur: NEGATIVE mg/dL
Specific Gravity, Urine: 1.024 (ref 1.005–1.030)
pH: 5 (ref 5.0–8.0)

## 2019-11-06 LAB — GLUCOSE, CAPILLARY
Glucose-Capillary: 171 mg/dL — ABNORMAL HIGH (ref 70–99)
Glucose-Capillary: 206 mg/dL — ABNORMAL HIGH (ref 70–99)
Glucose-Capillary: 166 mg/dL — ABNORMAL HIGH (ref 70–99)
Glucose-Capillary: 213 mg/dL — ABNORMAL HIGH (ref 70–99)

## 2019-11-06 MED ORDER — ENOXAPARIN SODIUM 30 MG/0.3ML ~~LOC~~ SOLN
30.0000 mg | SUBCUTANEOUS | Status: DC
  Administered 2019-11-06: 30 mg via SUBCUTANEOUS
  Filled 2019-11-06: qty 0.3

## 2019-11-06 MED ORDER — SODIUM CHLORIDE 0.9 % IV BOLUS
1000.0000 mL | Freq: Once | INTRAVENOUS | Status: AC
  Administered 2019-11-06: 10:00:00 1000 mL via INTRAVENOUS

## 2019-11-06 NOTE — Consult Note (Signed)
Medical Consultation   Amy Nunez  [id number]  DOB: 1961/08/20  DOA: 11/03/2019  PCP: Einar Pheasant, DO   Outpatient Specialists: Home.  Requesting physician: Dr. Coralie Keens.  Reason for consultation: Worsening renal function.   History of Present Illness: Amy Nunez is an 59 y.o. female type 2 diabetes mellitus, essential hypertension, stage IIIb CKD, renal cancer with right nephrectomy, IBS who was recently admitted and surgically intervened due to acute appendicitis and we are consulting due to worsening renal function.  She mentions that her urine volume has been less since yesterday and the color is darker.  Her blood pressure has been soft for on the low side since yesterday evening.  She complains of abdominal pain on the incision and does not have any other acute complaints.  Most recent lab work shows a white count of 13.0, decreasing from 16.5, hemoglobin of 10.7 g/dL and platelets of 324.  Most recent chemistry shows a sodium 133, potassium 3.5, chloride 102 and CO2 20 mmol/L.  Her BUN/creatinine has increased from 24/1.29 yesterday to 28/2.48 mg/dL today.  Urinalysis this morning shows a hazy appearance with small leukocyte esterase and rare bacteria, but no hematuria or proteinuria seen.  11/03/2019 CT scan abdomen/pelvis: Adrenals/Urinary Tract: Normal bilateral adrenal glands. Right nephrectomy with nonspecific asymmetrically increased size of the right psoas along the course of the right ureter and surgical clips that could be metastatic invasion of the musculature versus a benign etiology. Normal size and cortical thickness of the left kidney with a complicated 2.5 cm lower pole cortical cyst with focal aches shell type calcification on the lateral periphery and 13 Hounsfield unit density internally.  Review of Systems:  Review of Systems  Constitutional: Negative for chills, diaphoresis and fever.  HENT: Negative.  Negative for  congestion and ear pain.   Eyes: Negative for blurred vision.  Respiratory: Negative for shortness of breath and wheezing.   Cardiovascular: Negative for chest pain, palpitations, orthopnea, claudication, leg swelling and PND.  Gastrointestinal: Positive for abdominal pain.  Genitourinary: Negative for dysuria and urgency.       Has decreased urine volume.  Urine looks dark.  Skin: Negative for itching.  Neurological: Negative for dizziness and tremors.  Psychiatric/Behavioral: The patient does not have insomnia.    As per HPI otherwise 10 point review of systems negative.   Past Medical History: Past Medical History:  Diagnosis Date  . Blood transfusion without reported diagnosis   . Cancer [facility])    renal cancer  . Diabetes mellitus without complication ([place])   . Hypertension   . IBS (irritable bowel syndrome)     Past Surgical History: Past Surgical History:  Procedure Laterality Date  . ABDOMINAL HYSTERECTOMY    . ANKLE SURGERY    . COLONOSCOPY  2018   [facility]  . ESOPHAGOGASTRODUODENOSCOPY  10/15/2016   [facility]  . LAPAROSCOPIC APPENDECTOMY N/A 11/04/2019   Procedure: APPENDECTOMY LAPAROSCOPIC;  Surgeon: Coralie Keens, MD;  Location: [facility]S;  Service: General;  Laterality: N/A;  . NEPHRECTOMY Right    Allergies:   Allergies  Allergen Reactions  . Penicillins Anaphylaxis and Rash    Other reaction(s): Unknown  . Ciprofloxacin Hives  . Red Dye Diarrhea    Other reaction(s): Unknown   Social History:  reports that she has quit smoking. She has never used smokeless tobacco. She reports previous alcohol use. She reports that she does not  use drugs.  Family History: Family History  Problem Relation Age of Onset  . Colon cancer Neg Hx   . Esophageal cancer Neg Hx    Physical Exam: Vitals:   11/05/19 0932 11/05/19 1343 11/05/19 2152 11/06/19 0547  BP: 138/84 (!) 126/95 90/60 98/68   Pulse: 80 (!) 107 72 71  Resp: 16 18 20 18   Temp: 98 F (36.7 C) 98 F (36.7  C) 98.7 F (37.1 C) 98.5 F (36.9 C)  TempSrc: Oral Oral Oral Oral  SpO2: 100% 99% 100% 96%  Weight:      Height:        Constitutional: NAD.  Alert and awake, oriented x3, not in any acute distress. Eyes: PERLA, EOMI, irises appear normal, anicteric sclera,  ENMT: Lips and oral mucosa look dry.            Lips appears normal, oropharynx mucosa, tongue, posterior pharynx appear normal  Neck: neck appears normal, no masses, normal ROM, no thyromegaly, no JVD  CVS: S1-S2 clear, no murmur rubs or gallops, no LE edema, normal pedal pulses  Respiratory:  clear to auscultation bilaterally, no wheezing, rales or rhonchi. Respiratory effort normal. No accessory muscle use.  Abdomen: Nondistended.  BS positive.  Soft, mild tenderness on incision area. Musculoskeletal: : no cyanosis, clubbing or edema noted bilaterally Neuro: Cranial nerves II-XII intact, strength, sensation, reflexes Psych: judgement and insight appear normal, stable mood and affect, mental status Skin: no rashes or lesions or ulcers, no induration or nodules   Data reviewed:  I have personally reviewed following labs and imaging studies Labs:  CBC: Recent Labs  Lab 11/03/19 1513 11/04/19 0501 11/05/19 0725 11/06/19 0412  WBC 14.0* 18.1* 16.5* 13.0*  NEUTROABS 12.5*  --   --   --   HGB 12.0 12.3 10.6* 10.7*  HCT 34.9* 34.6* 29.3* 31.8*  MCV 90.9 88.7 85.9 94.1  PLT 430* 352 359 0000000    Basic Metabolic Panel: Recent Labs  Lab 11/03/19 1513 11/03/19 1513 11/04/19 0501 11/04/19 0501 11/05/19 0929 11/06/19 0412  NA 132*  --  136  --  134* 133*  K 4.1   < > 4.0   < > 3.7 3.5  CL 100  --  104  --  103 102  CO2 18*  --  17*  --  19* 20*  GLUCOSE 306*  --  236*  --  192* 176*  BUN 24*  --  20  --  23* 28*  CREATININE 1.53*  --  1.35*  --  1.29* 2.48*  CALCIUM 9.5  --  9.0  --  7.8* 7.9*   < > = values in this interval not displayed.   GFR Estimated Creatinine Clearance: 27.8 mL/min (A) (by C-G formula based  on SCr of 2.48 mg/dL (H)). Liver Function Tests: Recent Labs  Lab 11/03/19 1513 11/06/19 0412  AST 32 34  ALT 40 34  ALKPHOS 102 65  BILITOT 0.9 0.7  PROT 8.1 6.2*  ALBUMIN 4.3 3.4*   Recent Labs  Lab 11/03/19 1513  LIPASE 63*   No results for input(s): AMMONIA in the last 168 hours. Coagulation profile No results for input(s): INR, PROTIME in the last 168 hours.  Cardiac Enzymes: No results for input(s): CKTOTAL, CKMB, CKMBINDEX, TROPONINI in the last 168 hours. BNP: Invalid input(s): POCBNP CBG: Recent Labs  Lab 11/04/19 2038 11/05/19 1152 11/05/19 1658 11/05/19 2149 11/06/19 0725  GLUCAP 257* 222* 159* 129* 171*   D-Dimer No results for input(s):  DDIMER in the last 72 hours. Hgb A1c No results for input(s): HGBA1C in the last 72 hours. Lipid Profile No results for input(s): CHOL, HDL, LDLCALC, TRIG, CHOLHDL, LDLDIRECT in the last 72 hours. Thyroid function studies No results for input(s): TSH, T4TOTAL, T3FREE, THYROIDAB in the last 72 hours.  Invalid input(s): FREET3 Anemia work up No results for input(s): VITAMINB12, FOLATE, FERRITIN, TIBC, IRON, RETICCTPCT in the last 72 hours. Urinalysis    Component Value Date/Time   COLORURINE YELLOW 11/03/2019 1844   APPEARANCEUR CLEAR 11/03/2019 1844   LABSPEC 1.015 11/03/2019 1844   PHURINE 6.0 11/03/2019 1844   GLUCOSEU 250 (A) 11/03/2019 1844   HGBUR SMALL (A) 11/03/2019 1844   BILIRUBINUR NEGATIVE 11/03/2019 1844   KETONESUR 15 (A) 11/03/2019 1844   PROTEINUR NEGATIVE 11/03/2019 1844   NITRITE NEGATIVE 11/03/2019 1844   LEUKOCYTESUR NEGATIVE 11/03/2019 1844     Microbiology Recent Results (from the past 240 hour(s))  Urine culture     Status: None   Collection Time: 11/03/19  6:44 PM   Specimen: In/Out Cath Urine  Result Value Ref Range Status   Specimen Description   Final    IN/OUT CATH URINE Performed at [facility], [address] [place], [place] [address]    Special Requests    Final    NONE Performed at [facility], [address] [place], [place] [address]    Culture   Final    NO GROWTH Performed at [facility], [place] [address] [place],  [address]    Report Status 11/04/2019 FINAL  Final  Respiratory Panel by RT PCR (Flu A&B, Covid) - Nasopharyngeal Swab     Status: None   Collection Time: 11/03/19  7:40 PM   Specimen: Nasopharyngeal Swab  Result Value Ref Range Status   SARS Coronavirus 2 by RT PCR NEGATIVE NEGATIVE Final    Comment: (NOTE) SARS-CoV-2 target nucleic acids are NOT DETECTED. The SARS-CoV-2 RNA is generally detectable in upper respiratoy specimens during the acute phase of infection. The lowest concentration of SARS-CoV-2 viral copies this assay can detect is 131 copies/mL. A negative result does not preclude SARS-Cov-2 infection and should not be used as the sole basis for treatment or other patient management decisions. A negative result may occur with  improper specimen collection/handling, submission of specimen other than nasopharyngeal swab, presence of viral mutation(s) within the areas targeted by this assay, and inadequate number of viral copies (<131 copies/mL). A negative result must be combined with clinical observations, patient history, and epidemiological information. The expected result is Negative. Fact Sheet for Patients:  PinkCheek.be Fact Sheet for Healthcare Providers:  GravelBags.it This test is not yet ap proved or cleared by the [place] FDA and  has been authorized for detection and/or diagnosis of SARS-CoV-2 by FDA under an Emergency Use Authorization (EUA). This EUA will remain  in effect (meaning this test can be used) for the duration of the COVID-19 declaration under Section 564(b)(1) of the Act, 21 U.S.C. section 360bbb-3(b)(1), unless the authorization is terminated or revoked sooner.    Influenza A by PCR  NEGATIVE NEGATIVE Final   Influenza B by PCR NEGATIVE NEGATIVE Final    Comment: (NOTE) The Xpert Xpress SARS-CoV-2/FLU/RSV assay is intended as an aid in  the diagnosis of influenza from Nasopharyngeal swab specimens and  should not be used as a sole basis for treatment. Nasal washings and  aspirates are unacceptable for Xpert Xpress SARS-CoV-2/FLU/RSV  testing. Fact Sheet for Patients: PinkCheek.be Fact Sheet for Healthcare Providers: GravelBags.it This test is not yet approved or cleared by the [place] FDA and  has been authorized for detection and/or diagnosis of SARS-CoV-2 by  FDA under an Emergency Use Authorization (EUA). This EUA will remain  in effect (meaning this test can be used) for the duration of the  Covid-19 declaration under Section 564(b)(1) of the Act, 21  U.S.C. section 360bbb-3(b)(1), unless the authorization is  terminated or revoked. Performed at [facility], [address], [place], [place] [address]   Surgical PCR screen     Status: None   Collection Time: 11/04/19 12:15 AM   Specimen: Nasal Mucosa; Nasal Swab  Result Value Ref Range Status   MRSA, PCR NEGATIVE NEGATIVE Final   Staphylococcus aureus NEGATIVE NEGATIVE Final    Comment: (NOTE) The Xpert SA Assay (FDA approved for NASAL specimens in patients 92 years of age and older), is one component of a comprehensive surveillance program. It is not intended to diagnose infection nor to guide or monitor treatment. Performed at [facility], [place] [address], [place], [place] [address]        Inpatient Medications:   Scheduled Meds: . acetaminophen  1,000 mg Oral Q6H  . amLODipine  5 mg Oral Daily  . atorvastatin  80 mg Oral Daily  . enoxaparin (LOVENOX) injection  30 mg Subcutaneous Q24H  . insulin aspart  0-5 Units Subcutaneous QHS  . insulin aspart  0-9 Units Subcutaneous TID WC  . insulin glargine   8 Units Subcutaneous QHS  . morphine  30 mg Oral Q12H  . polyethylene glycol  17 g Oral Daily  . QUEtiapine  200 mg Oral Daily   And  . QUEtiapine  400 mg Oral QHS   Continuous Infusions: . lactated ringers 125 mL/hr at 11/06/19 0630     Radiological Exams on Admission: No results found.  Impression/Recommendations Principal problems:   History of renal cell carcinoma/right nephrectomy   Acute kidney injury superimposed on chronic kidney disease ([place]) Secondary to volume depletion and hypotension. Hold amlodipine, lisinopril and spironolactone. NS 1 bolus IVPB. Intake and output. Monitor renal function electrolytes.  Active Problems:   Type 2 diabetes mellitus ([place]) Currently on soft diet. Continue CBG monitoring with [place] SS.    Benign essential HTN Hold antihypertensives. Monitor blood pressure. Monitor renal function electrolytes. If BP rises, resume oral amlodipine first. Cautious use of ACE inhibitors/diuretics in the future due to single left kidney.     Acute appendicitis Continue postop care per general surgery.  Thank you for this consultation.  Our [facility] hospitalist team will follow the patient with you.  Time Spent: About 55 minutes were spent during the process of this consultation.  Reubin Milan M.D. [facility] 11/06/2019, 8:15 AM  This document was prepared using Dragon voice recognition software and may contain some unintended transcription errors.

## 2019-11-06 NOTE — Progress Notes (Signed)
2 Days Post-Op  Subjective: Still with some abdominal pain, but in NAD and describes at incisions.  Decrease appetite, but eating some.  No nausea.  ROS: See above, otherwise other systems negative  Objective: Vital signs in last 24 hours: Temp:  [98 F (36.7 C)-98.7 F (37.1 C)] 98.5 F (36.9 C) (03/11 0547) Pulse Rate:  [71-107] 71 (03/11 0547) Resp:  [16-20] 18 (03/11 0547) BP: (90-138)/(60-95) 98/68 (03/11 0547) SpO2:  [96 %-100 %] 96 % (03/11 0547) Last BM Date: 11/04/19  Intake/Output from previous day: 03/10 0701 - 03/11 0700 In: 1453.9 [P.O.:660; I.V.:793.9] Out: 500 [Urine:500] Intake/Output this shift: No intake/output data recorded.  PE: Gen: NAD sitting up in bed Heart: regular Lungs: CTAB Abd: soft, appropriately tender, +BS, incisions c/d/i with dermabond present Psych: less anxious today right now, A&O x3  Lab Results:  Recent Labs    11/05/19 0725 11/06/19 0412  WBC 16.5* 13.0*  HGB 10.6* 10.7*  HCT 29.3* 31.8*  PLT 359 324   BMET Recent Labs    11/05/19 0929 11/06/19 0412  NA 134* 133*  K 3.7 3.5  CL 103 102  CO2 19* 20*  GLUCOSE 192* 176*  BUN 23* 28*  CREATININE 1.29* 2.48*  CALCIUM 7.8* 7.9*   PT/INR No results for input(s): LABPROT, INR in the last 72 hours. CMP     Component Value Date/Time   NA 133 (L) 11/06/2019 0412   K 3.5 11/06/2019 0412   CL 102 11/06/2019 0412   CO2 20 (L) 11/06/2019 0412   GLUCOSE 176 (H) 11/06/2019 0412   BUN 28 (H) 11/06/2019 0412   CREATININE 2.48 (H) 11/06/2019 0412   CALCIUM 7.9 (L) 11/06/2019 0412   PROT 6.2 (L) 11/06/2019 0412   ALBUMIN 3.4 (L) 11/06/2019 0412   AST 34 11/06/2019 0412   ALT 34 11/06/2019 0412   ALKPHOS 65 11/06/2019 0412   BILITOT 0.7 11/06/2019 0412   GFRNONAA 20 (L) 11/06/2019 0412   GFRAA 24 (L) 11/06/2019 0412   Lipase     Component Value Date/Time   LIPASE 63 (H) 11/03/2019 1513       Studies/Results: No results  found.  Anti-infectives: Anti-infectives (From admission, onward)   Start     Dose/Rate Route Frequency Ordered Stop   11/04/19 0800  ceFEPIme (MAXIPIME) 2 g in sodium chloride 0.9 % 100 mL IVPB  Status:  Discontinued     2 g 200 mL/hr over 30 Minutes Intravenous 2 times daily 11/03/19 2256 11/04/19 1635   11/04/19 0600  metroNIDAZOLE (FLAGYL) IVPB 500 mg  Status:  Discontinued     500 mg 100 mL/hr over 60 Minutes Intravenous Every 8 hours 11/03/19 2256 11/04/19 1635   11/03/19 1945  ceFEPIme (MAXIPIME) 2 g in sodium chloride 0.9 % 100 mL IVPB     2 g 200 mL/hr over 30 Minutes Intravenous  Once 11/03/19 1934 11/03/19 2048   11/03/19 1945  metroNIDAZOLE (FLAGYL) IVPB 500 mg     500 mg 100 mL/hr over 60 Minutes Intravenous  Once 11/03/19 1934 11/03/19 2157       Assessment/Plan Acute on Chronic kidney disease - cr doubled today to 2.48.  Medicine consult today.  Resume fluids today as well Schizophrenia - restart home meds Chronic abdominal pain - resume home meds DM - CBGs better today after adjustments made by diabetes coordinator.  Appreciate their help HTN - resume home meds, defer to medicine if these need to be held given kidney function vs  if they are protective.  POD 2, s/p lap appy -patient's pathology showed fibrosis only and no evidence of acute appendicitis.  Therefore this is not the cause of her abdominal pain, which seems to be chronic at this point. -she may follow up with her chronic pain doctor for further evaluation and recommendations after discharge -appropriately tender post op as expected.   FEN - soft diet, IVFs for increasing creatinine. VTE - lovenox, decrease dose to 30mg /daily ID - none, WBC trending down on its own.   LOS: 1 day    Henreitta Cea , [facility] Surgery 11/06/2019, 8:37 AM Please see Amion for pager number during day hours 7:00am-4:30pm or 7:00am -11:30am on weekends

## 2019-11-07 ENCOUNTER — Inpatient Hospital Stay (HOSPITAL_COMMUNITY): Payer: Medicare HMO

## 2019-11-07 LAB — CBC
HCT: 29.4 % — ABNORMAL LOW (ref 36.0–46.0)
Hemoglobin: 9.6 g/dL — ABNORMAL LOW (ref 12.0–15.0)
MCH: 31 pg (ref 26.0–34.0)
MCHC: 32.7 g/dL (ref 30.0–36.0)
MCV: 94.8 fL (ref 80.0–100.0)
Platelets: 267 10*3/uL (ref 150–400)
RBC: 3.1 MIL/uL — ABNORMAL LOW (ref 3.87–5.11)
RDW: 12.9 % (ref 11.5–15.5)
WBC: 9.1 10*3/uL (ref 4.0–10.5)
nRBC: 0 % (ref 0.0–0.2)

## 2019-11-07 LAB — BASIC METABOLIC PANEL
Anion gap: 9 (ref 5–15)
Anion gap: 11 (ref 5–15)
BUN: 37 mg/dL — ABNORMAL HIGH (ref 6–20)
BUN: 31 mg/dL — ABNORMAL HIGH (ref 6–20)
CO2: 21 mmol/L — ABNORMAL LOW (ref 22–32)
CO2: 20 mmol/L — ABNORMAL LOW (ref 22–32)
Calcium: 7.7 mg/dL — ABNORMAL LOW (ref 8.9–10.3)
Calcium: 8 mg/dL — ABNORMAL LOW (ref 8.9–10.3)
Chloride: 107 mmol/L (ref 98–111)
Chloride: 108 mmol/L (ref 98–111)
Creatinine, Ser: 1.76 mg/dL — ABNORMAL HIGH (ref 0.44–1.00)
Creatinine, Ser: 1.29 mg/dL — ABNORMAL HIGH (ref 0.44–1.00)
GFR calc Af Amer: 36 mL/min — ABNORMAL LOW (ref >60)
GFR calc Af Amer: 52 mL/min — ABNORMAL LOW (ref >60)
GFR calc non Af Amer: 31 mL/min — ABNORMAL LOW (ref >60)
GFR calc non Af Amer: 45 mL/min — ABNORMAL LOW (ref >60)
Glucose, Bld: 167 mg/dL — ABNORMAL HIGH (ref 70–99)
Glucose, Bld: 215 mg/dL — ABNORMAL HIGH (ref 70–99)
Potassium: 3.5 mmol/L (ref 3.5–5.1)
Potassium: 3.6 mmol/L (ref 3.5–5.1)
Sodium: 137 mmol/L (ref 135–145)
Sodium: 139 mmol/L (ref 135–145)

## 2019-11-07 LAB — GLUCOSE, CAPILLARY
Glucose-Capillary: 129 mg/dL — ABNORMAL HIGH (ref 70–99)
Glucose-Capillary: 164 mg/dL — ABNORMAL HIGH (ref 70–99)
Glucose-Capillary: 207 mg/dL — ABNORMAL HIGH (ref 70–99)
Glucose-Capillary: 186 mg/dL — ABNORMAL HIGH (ref 70–99)

## 2019-11-07 MED ORDER — ENOXAPARIN SODIUM 40 MG/0.4ML ~~LOC~~ SOLN
40.0000 mg | SUBCUTANEOUS | Status: DC
  Administered 2019-11-07 – 2019-11-08 (×2): 40 mg via SUBCUTANEOUS
  Filled 2019-11-07 (×2): qty 0.4

## 2019-11-07 MED ORDER — ACETAMINOPHEN 500 MG PO TABS: 1000.0000 mg | ORAL_TABLET | Freq: Four times a day (QID) | ORAL | 0 refills | Status: AC

## 2019-11-07 MED ORDER — POLYETHYLENE GLYCOL 3350 17 G PO PACK
17.0000 g | PACK | Freq: Two times a day (BID) | ORAL | Status: DC
  Administered 2019-11-07: 17 g via ORAL
  Filled 2019-11-07 (×3): qty 1

## 2019-11-07 MED ORDER — POLYETHYLENE GLYCOL 3350 17 G PO PACK: 17.0000 g | PACK | Freq: Every day | ORAL | 0 refills | Status: DC

## 2019-11-07 NOTE — Progress Notes (Addendum)
3 Days Post-Op  Subjective: Patient feels ok but has some abdominal pain still.  She is eating some, but not drinking much.  Still on IVFs.  Voided 800cc yesterday.  ROS: See above, otherwise other systems negative  Objective: Vital signs in last 24 hours: Temp:  [98 F (36.7 C)-98.8 F (37.1 C)] 98.8 F (37.1 C) (03/12 0507) Pulse Rate:  [71-104] 71 (03/12 0507) Resp:  [16-18] 18 (03/12 0507) BP: (90-103)/(59-75) 95/61 (03/12 0507) SpO2:  [100 %] 100 % (03/12 0507) Last BM Date: 11/04/19  Intake/Output from previous day: 03/11 0701 - 03/12 0700 In: 3119.6 [P.O.:840; I.V.:2279.6] Out: 600 [Urine:600] Intake/Output this shift: No intake/output data recorded.  PE: Heart: regular Lungs: CTAB Abd: soft, appropriately tender, incisions c/d/i, +BS  Lab Results:  Recent Labs    11/06/19 0412 11/07/19 0435  WBC 13.0* 9.1  HGB 10.7* 9.6*  HCT 31.8* 29.4*  PLT 324 267   BMET Recent Labs    11/06/19 0412 11/07/19 0435  NA 133* 137  K 3.5 3.5  CL 102 107  CO2 20* 21*  GLUCOSE 176* 167*  BUN 28* 37*  CREATININE 2.48* 1.76*  CALCIUM 7.9* 7.7*   PT/INR No results for input(s): LABPROT, INR in the last 72 hours. CMP     Component Value Date/Time   NA 137 11/07/2019 0435   K 3.5 11/07/2019 0435   CL 107 11/07/2019 0435   CO2 21 (L) 11/07/2019 0435   GLUCOSE 167 (H) 11/07/2019 0435   BUN 37 (H) 11/07/2019 0435   CREATININE 1.76 (H) 11/07/2019 0435   CALCIUM 7.7 (L) 11/07/2019 0435   PROT 6.2 (L) 11/06/2019 0412   ALBUMIN 3.4 (L) 11/06/2019 0412   AST 34 11/06/2019 0412   ALT 34 11/06/2019 0412   ALKPHOS 65 11/06/2019 0412   BILITOT 0.7 11/06/2019 0412   GFRNONAA 31 (L) 11/07/2019 0435   GFRAA 36 (L) 11/07/2019 0435   Lipase     Component Value Date/Time   LIPASE 63 (H) 11/03/2019 1513       Studies/Results: No results found.  Anti-infectives: Anti-infectives (From admission, onward)   Start     Dose/Rate Route Frequency Ordered Stop   11/04/19 0800  ceFEPIme (MAXIPIME) 2 g in sodium chloride 0.9 % 100 mL IVPB  Status:  Discontinued     2 g 200 mL/hr over 30 Minutes Intravenous 2 times daily 11/03/19 2256 11/04/19 1635   11/04/19 0600  metroNIDAZOLE (FLAGYL) IVPB 500 mg  Status:  Discontinued     500 mg 100 mL/hr over 60 Minutes Intravenous Every 8 hours 11/03/19 2256 11/04/19 1635   11/03/19 1945  ceFEPIme (MAXIPIME) 2 g in sodium chloride 0.9 % 100 mL IVPB     2 g 200 mL/hr over 30 Minutes Intravenous  Once 11/03/19 1934 11/03/19 2048   11/03/19 1945  metroNIDAZOLE (FLAGYL) IVPB 500 mg     500 mg 100 mL/hr over 60 Minutes Intravenous  Once 11/03/19 1934 11/03/19 2157       Assessment/Plan Acute on Chronic kidney disease - cr improving today.  Per hospitalist Schizophrenia - restart home meds Chronic abdominal pain - resume home meds, will need to follow up with pain management doctor as outpatient DM - CBGs better today after adjustments made by diabetes coordinator.  Appreciate their help HTN - rper medicine  POD 2, s/p lap appy, did NOT have appendicitis just fibrosis -patient's pathology showed fibrosis only and no evidence of acute appendicitis.  Therefore this is  not the cause of her abdominal pain, which seems to be chronic at this point. -she may follow up with her chronic pain doctor for further evaluation and recommendations after discharge which I have thoroughly discussed with the patient.  She states she understands. -appropriately tender post op as expected. -follow up has been arranged for the patient -no narcotics will be prescribed at discharge as she is already on a significant amount of narcotics at home and these can be resumed  Greatly appreciate the hospitalist assistance and willingness to accept the patient in transfer to their service as she is surgically stable.   FEN -soft diet, IVFs  VTE -lovenox ID -none, WBC trending down on its own.   LOS: 2 days    Henreitta Cea ,  [facility] Surgery 11/07/2019, 9:22 AM Please see Amion for pager number during day hours 7:00am-4:30pm or 7:00am -11:30am on weekends

## 2019-11-07 NOTE — Progress Notes (Signed)
Lovenox per Pharmacy for DVT Prophylaxis    Pharmacy has been consulted from dosing enoxaparin (lovenox) in this patient for DVT prophylaxis.  The pharmacist has reviewed pertinent labs (Hgb _9.6__; PLT_267__), patient weight (_95__kg) and renal function (CrCl_39__mL/min) and decided that enoxaparin _40_mg SQ Q24Hrs is appropriate for this patient.  The pharmacy department will sign off at this time.  Please reconsult pharmacy if status changes or for further issues.  Thank you  Cyndia Diver PharmD, BCPS  11/07/2019, 6:21 AM

## 2019-11-07 NOTE — Progress Notes (Signed)
PT Cancellation Note  Patient Details Name: Amy Nunez MRN: [id number] DOB: Oct 26, 1960   Cancelled Treatment:     PT order received but eval deferred.  RN advises that pt is IND in room and has been ambulating in hallway unassisted.  NO PT needs at this time.  PT service will sign off.  Debe Coder PT Acute Rehabilitation Services Pager [phone] Office [phone]    [facility] 11/07/2019, 3:06 PM

## 2019-11-07 NOTE — Progress Notes (Signed)
PROGRESS NOTE    Amy Nunez  [id number] DOB: 02/18/1961 DOA: 11/03/2019 PCP: Einar Pheasant, DO    Brief Narrative: 59 y.o. female type 2 diabetes mellitus, essential hypertension, stage IIIb CKD, renal cancer with right nephrectomy, IBS who was recently admitted and surgically intervened due to acute appendicitis and we are consulting due to worsening renal function.  She mentions that her urine volume has been less since yesterday and the color is darker.  Her blood pressure has been soft for on the low side since yesterday evening.  She complains of abdominal pain on the incision and does not have any other acute complaints.  Most recent lab work shows a white count of 13.0, decreasing from 16.5, hemoglobin of 10.7 g/dL and platelets of 324.  Most recent chemistry shows a sodium 133, potassium 3.5, chloride 102 and CO2 20 mmol/L.  Her BUN/creatinine has increased from 24/1.29 yesterday to 28/2.48 mg/dL today.  Urinalysis this morning shows a hazy appearance with small leukocyte esterase and rare bacteria, but no hematuria or proteinuria seen.  11/03/2019 CT scan abdomen/pelvis: Adrenals/Urinary Tract: Normal bilateral adrenal glands. Right nephrectomy with nonspecific asymmetrically increased size of the right psoas along the course of the right ureter and surgical clips that could be metastatic invasion of the musculature versus a benign etiology. Normal size and cortical thickness of the left kidney with a complicated 2.5 cm lower pole cortical cyst with focal aches shell type calcification on the lateral periphery and 13 Hounsfield unit density internally.  Assessment & Plan:   Principal Problem:   Acute kidney injury superimposed on chronic kidney disease ([place]) Active Problems:   Type 2 diabetes mellitus (HCC)   Benign essential HTN   Acute appendicitis   #1  AKI on CKD with history of right nephrectomy secondary to renal cell cancer normal-her creatinine is not yet back  to baseline.  She remains hypotensive.  Increase fluids.  Continue to hold amlodipine Aldactone and lisinopril. Follow-up labs in a.m.  #2 history of essential hypertension patient is hypotensive in the hospital continue to hold all antihypertensives.  #3 type 2 diabetes on sliding scale insulin CBG (last 3)  Recent Labs    11/06/19 2209 11/07/19 0736 11/07/19 1155  GLUCAP 213* 129* 164*    #4 status post laparoscopic appendectomy biopsy shows fibrosis no appendicitis.  #5 schizophrenia continue Seroquel.   Estimated body mass index is 34.47 kg/m as calculated from the following:   Height as of this encounter: 5' 5.5" (1.664 m).   Weight as of this encounter: 95.4 kg.  DVT prophylaxis: Lovenox Code Status: Full code Family Communication: None  disposition Plan: Patient came from home likely will discharge home PT consult pending, barriers to discharge is ongoing hypotension with AKI on CKD and single kidney with diabetes and history of chronic hypertension   Consultants:   General surgery  Procedures: Status post laparoscopic appendectomy Antimicrobials: None  Subjective: She is resting in bed still complaining of abdominal pain reports her urine is dark  Objective: Vitals:   11/06/19 1356 11/06/19 2155 11/07/19 0507 11/07/19 1358  BP: (!) 90/59 103/75 95/61 (!) 148/76  Pulse: 96 (!) 104 71 77  Resp: 16 18 18 18   Temp: 98 F (36.7 C) 98.5 F (36.9 C) 98.8 F (37.1 C) 98.2 F (36.8 C)  TempSrc:   Oral Oral  SpO2: 100% 100% 100% 99%  Weight:      Height:        Intake/Output Summary (Last 24  hours) at 11/07/2019 1404 Last data filed at 11/07/2019 1100 Gross per 24 hour  Intake 3099.61 ml  Output 603 ml  Net 2496.61 ml   Filed Weights   11/03/19 1353  Weight: 95.4 kg    Examination:  General exam: Appears calm and comfortable  Respiratory system: Clear to auscultation. Respiratory effort normal. Cardiovascular system: S1 & S2 heard, RRR. No JVD,  murmurs, rubs, gallops or clicks. No pedal edema. Gastrointestinal system: Abdomen is nondistended, soft and nontender. No organomegaly or masses felt. Normal bowel sounds heard. Central nervous system: Alert and oriented. No focal neurological deficits. Extremities: Trace edema Skin: No rashes, lesions or ulcers Psychiatry: Judgement and insight appear normal. Mood & affect appropriate.     Data Reviewed: I have personally reviewed following labs and imaging studies  CBC: Recent Labs  Lab 11/03/19 1513 11/04/19 0501 11/05/19 0725 11/06/19 0412 11/07/19 0435  WBC 14.0* 18.1* 16.5* 13.0* 9.1  NEUTROABS 12.5*  --   --   --   --   HGB 12.0 12.3 10.6* 10.7* 9.6*  HCT 34.9* 34.6* 29.3* 31.8* 29.4*  MCV 90.9 88.7 85.9 94.1 94.8  PLT 430* 352 359 324 [id number]   Basic Metabolic Panel: Recent Labs  Lab 11/03/19 1513 11/04/19 0501 11/05/19 0929 11/06/19 0412 11/07/19 0435  NA 132* 136 134* 133* 137  K 4.1 4.0 3.7 3.5 3.5  CL 100 104 103 102 107  CO2 18* 17* 19* 20* 21*  GLUCOSE 306* 236* 192* 176* 167*  BUN 24* 20 23* 28* 37*  CREATININE 1.53* 1.35* 1.29* 2.48* 1.76*  CALCIUM 9.5 9.0 7.8* 7.9* 7.7*   GFR: Estimated Creatinine Clearance: 39.2 mL/min (A) (by C-G formula based on SCr of 1.76 mg/dL (H)). Liver Function Tests: Recent Labs  Lab 11/03/19 1513 11/06/19 0412  AST 32 34  ALT 40 34  ALKPHOS 102 65  BILITOT 0.9 0.7  PROT 8.1 6.2*  ALBUMIN 4.3 3.4*   Recent Labs  Lab 11/03/19 1513  LIPASE 63*   No results for input(s): AMMONIA in the last 168 hours. Coagulation Profile: No results for input(s): INR, PROTIME in the last 168 hours. Cardiac Enzymes: No results for input(s): CKTOTAL, CKMB, CKMBINDEX, TROPONINI in the last 168 hours. BNP (last 3 results) No results for input(s): PROBNP in the last 8760 hours. HbA1C: No results for input(s): HGBA1C in the last 72 hours. CBG: Recent Labs  Lab 11/06/19 1325 11/06/19 1601 11/06/19 2209 11/07/19 0736 11/07/19  1155  GLUCAP 206* 166* 213* 129* 164*   Lipid Profile: No results for input(s): CHOL, HDL, LDLCALC, TRIG, CHOLHDL, LDLDIRECT in the last 72 hours. Thyroid Function Tests: No results for input(s): TSH, T4TOTAL, FREET4, T3FREE, THYROIDAB in the last 72 hours. Anemia Panel: No results for input(s): VITAMINB12, FOLATE, FERRITIN, TIBC, IRON, RETICCTPCT in the last 72 hours. Sepsis Labs: No results for input(s): PROCALCITON, LATICACIDVEN in the last 168 hours.  Recent Results (from the past 240 hour(s))  Urine culture     Status: None   Collection Time: 11/03/19  6:44 PM   Specimen: In/Out Cath Urine  Result Value Ref Range Status   Specimen Description   Final    IN/OUT CATH URINE Performed at [facility], [address] [place],  [address]    Special Requests   Final    NONE Performed at [facility], [address] [place], [place] [address]    Culture   Final    NO GROWTH Performed  at [facility], [place] [address], [place],  [address]    Report Status 11/04/2019 FINAL  Final  Respiratory Panel by RT PCR (Flu A&B, Covid) - Nasopharyngeal Swab     Status: None   Collection Time: 11/03/19  7:40 PM   Specimen: Nasopharyngeal Swab  Result Value Ref Range Status   SARS Coronavirus 2 by RT PCR NEGATIVE NEGATIVE Final    Comment: (NOTE) SARS-CoV-2 target nucleic acids are NOT DETECTED. The SARS-CoV-2 RNA is generally detectable in upper respiratoy specimens during the acute phase of infection. The lowest concentration of SARS-CoV-2 viral copies this assay can detect is 131 copies/mL. A negative result does not preclude SARS-Cov-2 infection and should not be used as the sole basis for treatment or other patient management decisions. A negative result may occur with  improper specimen collection/handling, submission of specimen other than nasopharyngeal swab, presence of viral mutation(s) within the areas targeted by this assay, and  inadequate number of viral copies (<131 copies/mL). A negative result must be combined with clinical observations, patient history, and epidemiological information. The expected result is Negative. Fact Sheet for Patients:  PinkCheek.be Fact Sheet for Healthcare Providers:  GravelBags.it This test is not yet ap proved or cleared by the [place] FDA and  has been authorized for detection and/or diagnosis of SARS-CoV-2 by FDA under an Emergency Use Authorization (EUA). This EUA will remain  in effect (meaning this test can be used) for the duration of the COVID-19 declaration under Section 564(b)(1) of the Act, 21 U.S.C. section 360bbb-3(b)(1), unless the authorization is terminated or revoked sooner.    Influenza A by PCR NEGATIVE NEGATIVE Final   Influenza B by PCR NEGATIVE NEGATIVE Final    Comment: (NOTE) The Xpert Xpress SARS-CoV-2/FLU/RSV assay is intended as an aid in  the diagnosis of influenza from Nasopharyngeal swab specimens and  should not be used as a sole basis for treatment. Nasal washings and  aspirates are unacceptable for Xpert Xpress SARS-CoV-2/FLU/RSV  testing. Fact Sheet for Patients: PinkCheek.be Fact Sheet for Healthcare Providers: GravelBags.it This test is not yet approved or cleared by the [place] FDA and  has been authorized for detection and/or diagnosis of SARS-CoV-2 by  FDA under an Emergency Use Authorization (EUA). This EUA will remain  in effect (meaning this test can be used) for the duration of the  Covid-19 declaration under Section 564(b)(1) of the Act, 21  U.S.C. section 360bbb-3(b)(1), unless the authorization is  terminated or revoked. Performed at [facility], [address], [place], [place] [address]   Surgical PCR screen     Status: None   Collection Time: 11/04/19 12:15 AM   Specimen: Nasal Mucosa;  Nasal Swab  Result Value Ref Range Status   MRSA, PCR NEGATIVE NEGATIVE Final   Staphylococcus aureus NEGATIVE NEGATIVE Final    Comment: (NOTE) The Xpert SA Assay (FDA approved for NASAL specimens in patients 38 years of age and older), is one component of a comprehensive surveillance program. It is not intended to diagnose infection nor to guide or monitor treatment. Performed at [facility], [place] [address] [place],  [address]          Radiology Studies: No results found.      Scheduled Meds: . acetaminophen  1,000 mg Oral Q6H  . atorvastatin  80 mg Oral Daily  . enoxaparin (LOVENOX) injection  40 mg Subcutaneous Q24H  . insulin aspart  0-5 Units Subcutaneous QHS  .  insulin aspart  0-9 Units Subcutaneous TID WC  . insulin glargine  8 Units Subcutaneous QHS  . morphine  30 mg Oral Q12H  . polyethylene glycol  17 g Oral BID  . QUEtiapine  200 mg Oral Daily   And  . QUEtiapine  400 mg Oral QHS   Continuous Infusions: . lactated ringers 150 mL/hr at 11/07/19 0953     LOS: 2 days     Georgette Shell, MD  11/07/2019, 2:04 PM

## 2019-11-08 ENCOUNTER — Inpatient Hospital Stay (HOSPITAL_COMMUNITY): Payer: Medicare HMO

## 2019-11-08 LAB — GLUCOSE, CAPILLARY
Glucose-Capillary: 110 mg/dL — ABNORMAL HIGH (ref 70–99)
Glucose-Capillary: 165 mg/dL — ABNORMAL HIGH (ref 70–99)

## 2019-11-08 MED ORDER — LISINOPRIL 40 MG PO TABS: 20.0000 mg | ORAL_TABLET | Freq: Every day | ORAL | 1 refills | Status: DC

## 2019-11-08 MED ORDER — POLYETHYLENE GLYCOL 3350 17 G PO PACK: 17.0000 g | PACK | Freq: Two times a day (BID) | ORAL | 0 refills | Status: DC

## 2019-11-08 MED ORDER — ONDANSETRON HCL 4 MG PO TABS: 4.0000 mg | ORAL_TABLET | Freq: Every day | ORAL | 1 refills | Status: AC | PRN

## 2019-11-08 NOTE — Progress Notes (Signed)
Patient came out of the room stating she vomited bright red. Writer went in the room to check. Patient stated she vomited in the toilet and she already flushed it. Dr. Rodena Piety paged. MD. Ordered diet changed to soft diet for lunch. MD see how patient does for lunch and then d/c her home

## 2019-11-08 NOTE — Progress Notes (Signed)
D/C patient to home. D/C instructions given to patient. Patient had no questions. NT or writer will wheel patient out once family comes in

## 2019-11-08 NOTE — Discharge Summary (Signed)
Physician Discharge Summary  Amy Nunez [id number] DOB: Jun 08, 1961 DOA: 11/03/2019  PCP: Einar Pheasant, DO  Admit date: 11/03/2019 Discharge date: 11/08/2019  Admitted From: Home Disposition: Home Recommendations for Outpatient Follow-up:  1. Follow up with PCP in 1-2 weeks 2. Please obtain BMP/CBC in one week 3. Please follow up with general surgery  Home Health: None Equipment/Devices: None Discharge Condition: Stable and improved CODE STATUS: Full code Diet recommendation: Cardiac diet Brief/Interim Summary:58 y.o.femaletype 2 diabetes mellitus, essential hypertension, stage IIIb CKD, renal cancer with right nephrectomy, IBS who was admitted by general surgery for abdominal pain rule out appendicitis.  She was transferred to medical service a day prior to discharge as there were no surgical issues.   Most recent lab work shows a white count of 13.0, decreasing from 16.5, hemoglobin of 10.7 g/dL and platelets of 324. Most recent chemistry shows a sodium 133, potassium 3.5, chloride 102 and CO2 20 mmol/L. Her BUN/creatinine has increased from 24/1.29 yesterday to 28/2.48 mg/dL today. Urinalysis this morning shows a hazy appearance with small leukocyte esterase and rare bacteria, but no hematuria or proteinuria seen.  11/03/2019 CT scan abdomen/pelvis: Adrenals/Urinary Tract: Normal bilateral adrenal glands. Right nephrectomy with nonspecific asymmetrically increased size of the right psoas along the course of the right ureter and surgical clips that could be metastatic invasion of the musculature versus a benign etiology. Normal size and cortical thickness of the left kidney with a complicated 2.5 cm lower pole cortical cyst with focal aches shell type calcification on the lateral periphery and 13 Hounsfield unit density internally.    Discharge Diagnoses:  Principal Problem:   Acute kidney injury superimposed on chronic kidney disease ([place]) Active Problems:   Type 2  diabetes mellitus (HCC)   Benign essential HTN   Acute appendicitis   1  AKI on CKD stage II with history of right nephrectomy secondary to renal cell cancer-her renal functions have improved with IV hydration and holding antihypertensives.  Her blood pressure has now been trending up.  DC IV fluids and discharge her home today.   #2 history of essential hypertension continue home antihypertensives including Norvasc ACE inhibitor and beta-blocker.    #3 type 2 diabetes continue home meds.  #4 status post laparoscopic appendectomy biopsy shows fibrosis no appendicitis.  #5 schizophrenia continue Seroquel.   Estimated body mass index is 34.47 kg/m as calculated from the following:   Height as of this encounter: 5' 5.5" (1.664 m).   Weight as of this encounter: 95.4 kg.  Discharge Instructions  Discharge Instructions    Diet - low sodium heart healthy   Complete by: As directed    Increase activity slowly   Complete by: As directed      Allergies as of 11/08/2019      Reactions   Penicillins Anaphylaxis, Rash   Other reaction(s): Unknown   Ciprofloxacin Hives   Red Dye Diarrhea   Other reaction(s): Unknown      Medication List    TAKE these medications   acetaminophen 500 MG tablet Commonly known as: TYLENOL Take 2 tablets (1,000 mg total) by mouth every 6 (six) hours.   amLODipine 5 MG tablet Commonly known as: NORVASC Take 1 tablet (5 mg total) by mouth daily.   atorvastatin 80 MG tablet Commonly known as: LIPITOR Take 80 mg by mouth daily.   gabapentin 800 MG tablet Commonly known as: NEURONTIN Take 1 tablet (800 mg total) by mouth 2 (two) times daily. What changed:  when to take this  reasons to take this   HYDROcodone-acetaminophen 10-325 MG tablet Commonly known as: NORCO Take 1 tablet by mouth every 6 (six) hours as needed (pain).   Lantus SoloStar 100 UNIT/ML Solostar Pen Generic drug: insulin glargine Inject 5 Units into the skin at  bedtime.   lisinopril 40 MG tablet Commonly known as: ZESTRIL Take 0.5 tablets (20 mg total) by mouth daily. What changed: how much to take   morphine 30 MG 12 hr tablet Commonly known as: MS CONTIN Take 30 mg by mouth every 12 (twelve) hours.   polyethylene glycol 17 g packet Commonly known as: MIRALAX / GLYCOLAX Take 17 g by mouth 2 (two) times daily.   promethazine 25 MG tablet Commonly known as: PHENERGAN Take 25 mg by mouth daily.   QUEtiapine 200 MG tablet Commonly known as: SEROQUEL Take 1-2 tablets by mouth See admin instructions. Take 1 tablet in the morning and 2 tablets at night.   spironolactone 50 MG tablet Commonly known as: ALDACTONE Take 50 mg by mouth daily.   tiZANidine 4 MG tablet Commonly known as: ZANAFLEX Take 4 mg by mouth every 8 (eight) hours as needed for muscle spasms.      Follow-up Information    [facility] Follow up on 11/25/2019.   Specialty: General Surgery Why: 8:45 am, arrive by 8:15am for paperwork and check in process.  please bring photo ID and insurance card Contact information: [address] [place] [address] [phone]        Einar Pheasant, DO Follow up.   Specialty: Family Medicine Contact information: [address] [place]een [place] [address] [place] [address] [phone]          Allergies  Allergen Reactions  . Penicillins Anaphylaxis and Rash    Other reaction(s): Unknown  . Ciprofloxacin Hives  . Red Dye Diarrhea    Other reaction(s): Unknown    Consultations:  General surgery patient was admitted by general surgery.   Procedures/Studies: CT ABDOMEN PELVIS W CONTRAST  Result Date: 11/03/2019 CLINICAL DATA:  Abdominal pain and neutropenia. EXAM: CT ABDOMEN AND PELVIS WITH CONTRAST TECHNIQUE: Multidetector CT imaging of the abdomen and pelvis was performed using the standard protocol following bolus administration of intravenous contrast. Automatic exposure control utilized.  CONTRAST:  3mL OMNIPAQUE IOHEXOL 300 MG/ML  SOLN COMPARISON:  September 18, 2019. FINDINGS: Lower chest: Mild cardiomegaly with mild right main and left circumflex coronary calcification. Central pulmonary vascular congestion without pulmonary edema or pleural effusion. Thin linear atelectasis in the lung bases. Moderately sized hiatal hernia. Hepatobiliary: Fatty infiltration of the liver. Normal hepatic size and normal smooth hepatic contours. Decompressed and otherwise normal gallbladder. Normal biliary tree. Pancreas: Mild atrophy. Patient motion degrades image quality in this region and subtle inflammatory changes not excluded. There is no apparent dilatation of the main pancreatic duct or sequela of chronic pancreatitis. Spleen: Small spleen measuring 7.6 x 4.6 cm in the axial plane. Normal splenic contours. Adrenals/Urinary Tract: Normal bilateral adrenal glands. Right nephrectomy with nonspecific asymmetrically increased size of the right psoas along the course of the right ureter and surgical clips that could be metastatic invasion of the musculature versus a benign etiology. Normal size and cortical thickness of the left kidney with a complicated 2.5 cm lower pole cortical cyst with focal aches shell type calcification on the lateral periphery and 13 Hounsfield unit density internally. Stomach/Bowel: Abnormally dilated fluid field retrocecal appendix measuring 10 mm diameter with adjacent mild  inflammatory change medial to the right iliac vessels on axial series 2, image 58 and sagittal series 6, image 49. Nonobstructed. Diverticulosis in the proximal and distal colon with an acute mild right lower quadrant diverticulitis involving the ascending colon and cecum. No associated perforation or abscess. Adhesion formation of the duodenal C loop to the adjacent small bowel loops and right psoas in the nephrectomy bed. Vascular/Lymphatic: Small right lower quadrant mesenteric lymph nodes, likely reactive.  Thoracoabdominal aorta and bilateral iliac calcified atherosclerosis without an abdominal aortic aneurysm. 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 Reproductive: Hysterectomy. No adnexal cyst or mass. Other: No free intraperitoneal fluid or air. Musculoskeletal: Atrophy and diastasis of the abdominal rectus musculature. Moderate skeletal degenerative change and mild bone demineralization. Critical Value/emergent results were called by telephone at the time of interpretation on 11/03/2019 at 7:22 pm to provider [facility] , who verbally acknowledged these results. IMPRESSION: 1. Acute uncomplicated appendicitis and mild uncomplicated diverticulitis of the ascending colon. 2. Right nephrectomy with nonspecific asymmetrically increased size of the right psoas along the course of the right ureter and surgical clips that could be metastatic invasion of the musculature versus a benign etiology. Additional nonemergent outpatient MR abdomen pelvis with intravenous contrast recommended to assess for any suspicious muscle enhancement. Correlation with surgical history may be useful. 3. Adhesion formation of the duodenal C loop to the adjacent small bowel loops and right psoas in the nephrectomy bed without bowel obstruction. 4. A complicated left renal midpole cyst. 5. Fatty infiltration of the liver. 6. Moderately sized hiatal hernia. 7. Mild cardiomegaly with mild right main and left circumflex coronary artery disease. Aortic Atherosclerosis (ICD10-I70.0). Electronically Signed   By: Revonda Humphrey   On: 11/03/2019 19:23   US RENAL  Result Date: 11/08/2019 CLINICAL DATA:  Initial evaluation for elevated serum creatinine. History of prior right nephrectomy. EXAM: RENAL / URINARY TRACT ULTRASOUND COMPLETE COMPARISON:  Prior ultrasound from 09/19/2019. FINDINGS: Right Kidney: Surgically absent. Left Kidney: Renal measurements: 11.6 x 5.6 x 5.4 cm = volume: 185.4 mL.  Echogenicity within normal limits. No nephrolithiasis or hydronephrosis. 3.4 cm simple cyst present at the lower pole. Bladder: Appears normal for degree of bladder distention. Other: None. IMPRESSION: 1. No left-sided hydronephrosis. 2. 3.4 cm simple cyst at the lower pole of the left kidney. 3. Prior right nephrectomy. Electronically Signed   By: Jeannine Boga M.D.   On: 11/08/2019 06:40    (Echo, Carotid, EGD, Colonoscopy, ERCP)    Subjective:  Resting in bed still complains of abdominal pain Discharge Exam: Vitals:   11/07/19 2018 11/08/19 0609  BP: 127/73 (!) 144/80  Pulse: 81 68  Resp: 14 19  Temp: 98.5 F (36.9 C) 98 F (36.7 C)  SpO2: 97% 97%   Vitals:   11/07/19 0507 11/07/19 1358 11/07/19 2018 11/08/19 0609  BP: 95/61 (!) 148/76 127/73 (!) 144/80  Pulse: 71 77 81 68  Resp: 18 18 14 19   Temp: 98.8 F (37.1 C) 98.2 F (36.8 C) 98.5 F (36.9 C) 98 F (36.7 C)  TempSrc: Oral Oral Oral Oral  SpO2: 100% 99% 97% 97%  Weight:      Height:        General: Pt is alert, awake, not in acute distress Cardiovascular: RRR, S1/S2 +, no rubs, no gallops Respiratory: CTA bilaterally, no wheezing, no rhonchi Abdominal: Soft, NT, ND, bowel sounds + Extremities: no edema, no cyanosis    The results of  significant diagnostics from this hospitalization (including imaging, microbiology, ancillary and laboratory) are listed below for reference.     Microbiology: Recent Results (from the past 240 hour(s))  Urine culture     Status: None   Collection Time: 11/03/19  6:44 PM   Specimen: In/Out Cath Urine  Result Value Ref Range Status   Specimen Description   Final    IN/OUT CATH URINE Performed at [facility], [address] [place], [place] [address]    Special Requests   Final    NONE Performed at [facility], [address] [place], [place] [address]    Culture   Final    NO GROWTH Performed at [facility], [place] [address],  [place], [place] [address]    Report Status 11/04/2019 FINAL  Final  Respiratory Panel by RT PCR (Flu A&B, Covid) - Nasopharyngeal Swab     Status: None   Collection Time: 11/03/19  7:40 PM   Specimen: Nasopharyngeal Swab  Result Value Ref Range Status   SARS Coronavirus 2 by RT PCR NEGATIVE NEGATIVE Final    Comment: (NOTE) SARS-CoV-2 target nucleic acids are NOT DETECTED. The SARS-CoV-2 RNA is generally detectable in upper respiratoy specimens during the acute phase of infection. The lowest concentration of SARS-CoV-2 viral copies this assay can detect is 131 copies/mL. A negative result does not preclude SARS-Cov-2 infection and should not be used as the sole basis for treatment or other patient management decisions. A negative result may occur with  improper specimen collection/handling, submission of specimen other than nasopharyngeal swab, presence of viral mutation(s) within the areas targeted by this assay, and inadequate number of viral copies (<131 copies/mL). A negative result must be combined with clinical observations, patient history, and epidemiological information. The expected result is Negative. Fact Sheet for Patients:  PinkCheek.be Fact Sheet for Healthcare Providers:  GravelBags.it This test is not yet ap proved or cleared by the [place] FDA and  has been authorized for detection and/or diagnosis of SARS-CoV-2 by FDA under an Emergency Use Authorization (EUA). This EUA will remain  in effect (meaning this test can be used) for the duration of the COVID-19 declaration under Section 564(b)(1) of the Act, 21 U.S.C. section 360bbb-3(b)(1), unless the authorization is terminated or revoked sooner.    Influenza A by PCR NEGATIVE NEGATIVE Final   Influenza B by PCR NEGATIVE NEGATIVE Final    Comment: (NOTE) The Xpert Xpress SARS-CoV-2/FLU/RSV assay is intended as an aid in  the diagnosis of influenza from  Nasopharyngeal swab specimens and  should not be used as a sole basis for treatment. Nasal washings and  aspirates are unacceptable for Xpert Xpress SARS-CoV-2/FLU/RSV  testing. Fact Sheet for Patients: PinkCheek.be Fact Sheet for Healthcare Providers: GravelBags.it This test is not yet approved or cleared by the [place] FDA and  has been authorized for detection and/or diagnosis of SARS-CoV-2 by  FDA under an Emergency Use Authorization (EUA). This EUA will remain  in effect (meaning this test can be used) for the duration of the  Covid-19 declaration under Section 564(b)(1) of the Act, 21  U.S.C. section 360bbb-3(b)(1), unless the authorization is  terminated or revoked. Performed at [facility], [address], [place], [place] [address]   Surgical PCR screen     Status: None   Collection Time: 11/04/19 12:15 AM   Specimen: Nasal Mucosa; Nasal Swab  Result Value Ref Range Status   MRSA,  PCR NEGATIVE NEGATIVE Final   Staphylococcus aureus NEGATIVE NEGATIVE Final    Comment: (NOTE) The Xpert SA Assay (FDA approved for NASAL specimens in patients 47 years of age and older), is one component of a comprehensive surveillance program. It is not intended to diagnose infection nor to guide or monitor treatment. Performed at [facility], [place] [address] [place], [place] [address]      Labs: BNP (last 3 results) No results for input(s): BNP in the last 8760 hours. Basic Metabolic Panel: Recent Labs  Lab 11/04/19 0501 11/05/19 0929 11/06/19 0412 11/07/19 0435 11/07/19 1435  NA 136 134* 133* 137 139  K 4.0 3.7 3.5 3.5 3.6  CL 104 103 102 107 108  CO2 17* 19* 20* 21* 20*  GLUCOSE 236* 192* 176* 167* 215*  BUN 20 23* 28* 37* 31*  CREATININE 1.35* 1.29* 2.48* 1.76* 1.29*  CALCIUM 9.0 7.8* 7.9* 7.7* 8.0*   Liver Function Tests: Recent Labs  Lab 11/03/19 1513 11/06/19 0412  AST  32 34  ALT 40 34  ALKPHOS 102 65  BILITOT 0.9 0.7  PROT 8.1 6.2*  ALBUMIN 4.3 3.4*   Recent Labs  Lab 11/03/19 1513  LIPASE 63*   No results for input(s): AMMONIA in the last 168 hours. CBC: Recent Labs  Lab 11/03/19 1513 11/04/19 0501 11/05/19 0725 11/06/19 0412 11/07/19 0435  WBC 14.0* 18.1* 16.5* 13.0* 9.1  NEUTROABS 12.5*  --   --   --   --   HGB 12.0 12.3 10.6* 10.7* 9.6*  HCT 34.9* 34.6* 29.3* 31.8* 29.4*  MCV 90.9 88.7 85.9 94.1 94.8  PLT 430* 352 359 324 267   Cardiac Enzymes: No results for input(s): CKTOTAL, CKMB, CKMBINDEX, TROPONINI in the last 168 hours. BNP: Invalid input(s): POCBNP CBG: Recent Labs  Lab 11/07/19 0736 11/07/19 1155 11/07/19 1602 11/07/19 2015 11/08/19 0804  GLUCAP 129* 164* 207* 186* 110*   D-Dimer No results for input(s): DDIMER in the last 72 hours. Hgb A1c No results for input(s): HGBA1C in the last 72 hours. Lipid Profile No results for input(s): CHOL, HDL, LDLCALC, TRIG, CHOLHDL, LDLDIRECT in the last 72 hours. Thyroid function studies No results for input(s): TSH, T4TOTAL, T3FREE, THYROIDAB in the last 72 hours.  Invalid input(s): FREET3 Anemia work up No results for input(s): VITAMINB12, FOLATE, FERRITIN, TIBC, IRON, RETICCTPCT in the last 72 hours. Urinalysis    Component Value Date/Time   COLORURINE YELLOW 11/06/2019 0812   APPEARANCEUR HAZY (A) 11/06/2019 0812   LABSPEC 1.024 11/06/2019 0812   PHURINE 5.0 11/06/2019 0812   GLUCOSEU NEGATIVE 11/06/2019 0812   HGBUR NEGATIVE 11/06/2019 0812   BILIRUBINUR NEGATIVE 11/06/2019 0812   KETONESUR NEGATIVE 11/06/2019 0812   PROTEINUR NEGATIVE 11/06/2019 0812   NITRITE NEGATIVE 11/06/2019 0812   LEUKOCYTESUR SMALL (A) 11/06/2019 0812   Sepsis Labs Invalid input(s): PROCALCITONIN,  WBC,  LACTICIDVEN Microbiology Recent Results (from the past 240 hour(s))  Urine culture     Status: None   Collection Time: 11/03/19  6:44 PM   Specimen: In/Out Cath Urine  Result  Value Ref Range Status   Specimen Description   Final    IN/OUT CATH URINE Performed at [facility], [address] [place], [place] [address]    Special Requests   Final    NONE Performed at [facility], [address] [place], [place] [address]    Culture   Final    NO GROWTH Performed at  [facility], [place] [address], [place], [place] [address]    Report Status 11/04/2019 FINAL  Final  Respiratory Panel by RT PCR (Flu A&B, Covid) - Nasopharyngeal Swab     Status: None   Collection Time: 11/03/19  7:40 PM   Specimen: Nasopharyngeal Swab  Result Value Ref Range Status   SARS Coronavirus 2 by RT PCR NEGATIVE NEGATIVE Final    Comment: (NOTE) SARS-CoV-2 target nucleic acids are NOT DETECTED. The SARS-CoV-2 RNA is generally detectable in upper respiratoy specimens during the acute phase of infection. The lowest concentration of SARS-CoV-2 viral copies this assay can detect is 131 copies/mL. A negative result does not preclude SARS-Cov-2 infection and should not be used as the sole basis for treatment or other patient management decisions. A negative result may occur with  improper specimen collection/handling, submission of specimen other than nasopharyngeal swab, presence of viral mutation(s) within the areas targeted by this assay, and inadequate number of viral copies (<131 copies/mL). A negative result must be combined with clinical observations, patient history, and epidemiological information. The expected result is Negative. Fact Sheet for Patients:  PinkCheek.be Fact Sheet for Healthcare Providers:  GravelBags.it This test is not yet ap proved or cleared by the [place] FDA and  has been authorized for detection and/or diagnosis of SARS-CoV-2 by FDA under an Emergency Use Authorization (EUA). This EUA will remain  in effect (meaning this test can be used) for the duration of  the COVID-19 declaration under Section 564(b)(1) of the Act, 21 U.S.C. section 360bbb-3(b)(1), unless the authorization is terminated or revoked sooner.    Influenza A by PCR NEGATIVE NEGATIVE Final   Influenza B by PCR NEGATIVE NEGATIVE Final    Comment: (NOTE) The Xpert Xpress SARS-CoV-2/FLU/RSV assay is intended as an aid in  the diagnosis of influenza from Nasopharyngeal swab specimens and  should not be used as a sole basis for treatment. Nasal washings and  aspirates are unacceptable for Xpert Xpress SARS-CoV-2/FLU/RSV  testing. Fact Sheet for Patients: PinkCheek.be Fact Sheet for Healthcare Providers: GravelBags.it This test is not yet approved or cleared by the [place] FDA and  has been authorized for detection and/or diagnosis of SARS-CoV-2 by  FDA under an Emergency Use Authorization (EUA). This EUA will remain  in effect (meaning this test can be used) for the duration of the  Covid-19 declaration under Section 564(b)(1) of the Act, 21  U.S.C. section 360bbb-3(b)(1), unless the authorization is  terminated or revoked. Performed at [facility], [address] [place], [place] [address]   Surgical PCR screen     Status: None   Collection Time: 11/04/19 12:15 AM   Specimen: Nasal Mucosa; Nasal Swab  Result Value Ref Range Status   MRSA, PCR NEGATIVE NEGATIVE Final   Staphylococcus aureus NEGATIVE NEGATIVE Final    Comment: (NOTE) The Xpert SA Assay (FDA approved for NASAL specimens in patients 38 years of age and older), is one component of a comprehensive surveillance program. It is not intended to diagnose infection nor to guide or monitor treatment. Performed at [facility], [place] [address], [place], [place] [address]      Time coordinating discharge:  39 minutes  SIGNED:   Georgette Shell, MD  [facility] 11/08/2019, 10:09 AM Pager   If 7PM-7AM,  please contact night-coverage [email or web address] Password [email or web address]

## 2019-11-10 MED FILL — ONDANSETRON HCL 4 MG TABLET: 4 | 30 days supply | Qty: 30 | Fill #0

## 2019-11-10 MED FILL — SM CLEARLAX POWDER: 17 | 7 days supply | Qty: 238 | Fill #0

## 2019-11-10 MED FILL — LISINOPRIL 40 MG TABLET: 40 | 60 days supply | Qty: 30 | Fill #0

## 2019-11-25 ENCOUNTER — Ambulatory Visit: Payer: Medicare HMO | Admitting: Gastroenterology

## 2019-12-11 ENCOUNTER — Inpatient Hospital Stay (HOSPITAL_BASED_OUTPATIENT_CLINIC_OR_DEPARTMENT_OTHER)
Admission: EM | Admit: 2019-12-11 | Discharge: 2019-12-18 | DRG: 392 | Disposition: A | Discharge status: Home / Self Care | Payer: Medicare HMO | Attending: Internal Medicine | Admitting: Internal Medicine

## 2019-12-11 ENCOUNTER — Encounter (HOSPITAL_BASED_OUTPATIENT_CLINIC_OR_DEPARTMENT_OTHER): Payer: Self-pay

## 2019-12-11 ENCOUNTER — Emergency Department (HOSPITAL_BASED_OUTPATIENT_CLINIC_OR_DEPARTMENT_OTHER): Payer: Medicare HMO

## 2019-12-11 ENCOUNTER — Other Ambulatory Visit: Payer: Self-pay

## 2019-12-11 DIAGNOSIS — Z886 Allergy status to analgesic agent status: Secondary | ICD-10-CM

## 2019-12-11 DIAGNOSIS — Z87891 Personal history of nicotine dependence: Secondary | ICD-10-CM

## 2019-12-11 DIAGNOSIS — R6510 Systemic inflammatory response syndrome (SIRS) of non-infectious origin without acute organ dysfunction: Secondary | ICD-10-CM | POA: Diagnosis present

## 2019-12-11 DIAGNOSIS — R197 Diarrhea, unspecified: Secondary | ICD-10-CM

## 2019-12-11 DIAGNOSIS — R1084 Generalized abdominal pain: Secondary | ICD-10-CM | POA: Diagnosis not present

## 2019-12-11 DIAGNOSIS — Z880 Allergy status to penicillin: Secondary | ICD-10-CM

## 2019-12-11 DIAGNOSIS — Z85528 Personal history of other malignant neoplasm of kidney: Secondary | ICD-10-CM

## 2019-12-11 DIAGNOSIS — N1832 Chronic kidney disease, stage 3b: Secondary | ICD-10-CM | POA: Diagnosis present

## 2019-12-11 DIAGNOSIS — R109 Unspecified abdominal pain: Secondary | ICD-10-CM | POA: Diagnosis present

## 2019-12-11 DIAGNOSIS — K580 Irritable bowel syndrome with diarrhea: Secondary | ICD-10-CM | POA: Diagnosis present

## 2019-12-11 DIAGNOSIS — Z881 Allergy status to other antibiotic agents status: Secondary | ICD-10-CM

## 2019-12-11 DIAGNOSIS — R0602 Shortness of breath: Secondary | ICD-10-CM

## 2019-12-11 DIAGNOSIS — G894 Chronic pain syndrome: Secondary | ICD-10-CM | POA: Diagnosis present

## 2019-12-11 DIAGNOSIS — T402X5A Adverse effect of other opioids, initial encounter: Secondary | ICD-10-CM | POA: Diagnosis present

## 2019-12-11 DIAGNOSIS — A419 Sepsis, unspecified organism: Secondary | ICD-10-CM

## 2019-12-11 DIAGNOSIS — I130 Hypertensive heart and chronic kidney disease with heart failure and stage 1 through stage 4 chronic kidney disease, or unspecified chronic kidney disease: Secondary | ICD-10-CM | POA: Diagnosis present

## 2019-12-11 DIAGNOSIS — I10 Essential (primary) hypertension: Secondary | ICD-10-CM

## 2019-12-11 DIAGNOSIS — R739 Hyperglycemia, unspecified: Secondary | ICD-10-CM

## 2019-12-11 DIAGNOSIS — E872 Acidosis: Secondary | ICD-10-CM | POA: Diagnosis present

## 2019-12-11 DIAGNOSIS — E1122 Type 2 diabetes mellitus with diabetic chronic kidney disease: Secondary | ICD-10-CM | POA: Diagnosis present

## 2019-12-11 DIAGNOSIS — K5903 Drug induced constipation: Principal | ICD-10-CM | POA: Diagnosis present

## 2019-12-11 DIAGNOSIS — Z905 Acquired absence of kidney: Secondary | ICD-10-CM

## 2019-12-11 DIAGNOSIS — Z20822 Contact with and (suspected) exposure to covid-19: Secondary | ICD-10-CM | POA: Diagnosis present

## 2019-12-11 DIAGNOSIS — R112 Nausea with vomiting, unspecified: Secondary | ICD-10-CM

## 2019-12-11 DIAGNOSIS — Z9102 Food additives allergy status: Secondary | ICD-10-CM

## 2019-12-11 DIAGNOSIS — E119 Type 2 diabetes mellitus without complications: Secondary | ICD-10-CM

## 2019-12-11 DIAGNOSIS — I5030 Unspecified diastolic (congestive) heart failure: Secondary | ICD-10-CM | POA: Diagnosis present

## 2019-12-11 DIAGNOSIS — E871 Hypo-osmolality and hyponatremia: Secondary | ICD-10-CM | POA: Diagnosis present

## 2019-12-11 LAB — COMPREHENSIVE METABOLIC PANEL
ALT: 24 U/L (ref 0–44)
AST: 27 U/L (ref 15–41)
Albumin: 4.7 g/dL (ref 3.5–5.0)
Alkaline Phosphatase: 123 U/L (ref 38–126)
Anion gap: 16 — ABNORMAL HIGH (ref 5–15)
BUN: 22 mg/dL — ABNORMAL HIGH (ref 6–20)
CO2: 18 mmol/L — ABNORMAL LOW (ref 22–32)
Calcium: 9.9 mg/dL (ref 8.9–10.3)
Chloride: 97 mmol/L — ABNORMAL LOW (ref 98–111)
Creatinine, Ser: 1.25 mg/dL — ABNORMAL HIGH (ref 0.44–1.00)
GFR calc Af Amer: 54 mL/min — ABNORMAL LOW (ref >60)
GFR calc non Af Amer: 47 mL/min — ABNORMAL LOW (ref >60)
Glucose, Bld: 337 mg/dL — ABNORMAL HIGH (ref 70–99)
Potassium: 4.6 mmol/L (ref 3.5–5.1)
Sodium: 131 mmol/L — ABNORMAL LOW (ref 135–145)
Total Bilirubin: 0.7 mg/dL (ref 0.3–1.2)
Total Protein: 8.6 g/dL — ABNORMAL HIGH (ref 6.5–8.1)

## 2019-12-11 LAB — CBC WITH DIFFERENTIAL/PLATELET
Abs Immature Granulocytes: 0.15 10*3/uL — ABNORMAL HIGH (ref 0.00–0.07)
Basophils Absolute: 0 10*3/uL (ref 0.0–0.1)
Basophils Relative: 0 %
Eosinophils Absolute: 0 10*3/uL (ref 0.0–0.5)
Eosinophils Relative: 0 %
HCT: 35.6 % — ABNORMAL LOW (ref 36.0–46.0)
Hemoglobin: 12.7 g/dL (ref 12.0–15.0)
Immature Granulocytes: 1 %
Lymphocytes Relative: 7 %
Lymphs Abs: 0.9 10*3/uL (ref 0.7–4.0)
MCH: 32.5 pg (ref 26.0–34.0)
MCHC: 35.7 g/dL (ref 30.0–36.0)
MCV: 91 fL (ref 80.0–100.0)
Monocytes Absolute: 0.3 10*3/uL (ref 0.1–1.0)
Monocytes Relative: 3 %
Neutro Abs: 11 10*3/uL — ABNORMAL HIGH (ref 1.7–7.7)
Neutrophils Relative %: 89 %
Platelets: 402 10*3/uL — ABNORMAL HIGH (ref 150–400)
RBC: 3.91 MIL/uL (ref 3.87–5.11)
RDW: 13.5 % (ref 11.5–15.5)
WBC: 12.4 10*3/uL — ABNORMAL HIGH (ref 4.0–10.5)
nRBC: 0 % (ref 0.0–0.2)

## 2019-12-11 LAB — LACTIC ACID, PLASMA: Lactic Acid, Venous: 2.7 mmol/L (ref 0.5–1.9)

## 2019-12-11 LAB — PROTIME-INR
INR: 1 (ref 0.8–1.2)
Prothrombin Time: 13 seconds (ref 11.4–15.2)

## 2019-12-11 LAB — LIPASE, BLOOD: Lipase: 37 U/L (ref 11–51)

## 2019-12-11 MED ORDER — IOHEXOL 300 MG/ML  SOLN
100.0000 mL | Freq: Once | INTRAMUSCULAR | Status: AC
  Administered 2019-12-11: 80 mL via INTRAVENOUS

## 2019-12-11 MED ORDER — SODIUM CHLORIDE 0.9 % IV SOLN: 2.0000 g | Freq: Once | INTRAVENOUS | Status: DC

## 2019-12-11 MED ORDER — VANCOMYCIN HCL 1750 MG/350ML IV SOLN
1750.0000 mg | Freq: Once | INTRAVENOUS | Status: DC
  Filled 2019-12-11: qty 350

## 2019-12-11 MED ORDER — ONDANSETRON HCL 4 MG/2ML IJ SOLN
4.0000 mg | Freq: Once | INTRAMUSCULAR | Status: AC
  Administered 2019-12-12: 4 mg via INTRAVENOUS
  Filled 2019-12-11: qty 2

## 2019-12-11 MED ORDER — SODIUM CHLORIDE 0.9 % IV BOLUS
500.0000 mL | Freq: Once | INTRAVENOUS | Status: AC
  Administered 2019-12-11: 500 mL via INTRAVENOUS

## 2019-12-11 MED ORDER — VANCOMYCIN HCL IN DEXTROSE 1-5 GM/200ML-% IV SOLN: 1000.0000 mg | INTRAVENOUS | Status: DC

## 2019-12-11 MED ORDER — HYDROMORPHONE HCL 1 MG/ML IJ SOLN
0.5000 mg | Freq: Once | INTRAMUSCULAR | Status: AC
  Administered 2019-12-11: 0.5 mg via INTRAVENOUS
  Filled 2019-12-11: qty 1

## 2019-12-11 MED ORDER — ONDANSETRON HCL 4 MG/2ML IJ SOLN
4.0000 mg | Freq: Once | INTRAMUSCULAR | Status: AC
  Administered 2019-12-11: 4 mg via INTRAVENOUS
  Filled 2019-12-11: qty 2

## 2019-12-11 MED ORDER — VANCOMYCIN HCL IN DEXTROSE 1-5 GM/200ML-% IV SOLN
1000.0000 mg | Freq: Once | INTRAVENOUS | Status: DC
  Filled 2019-12-11: qty 200

## 2019-12-11 MED ORDER — INSULIN ASPART 100 UNIT/ML IV SOLN
5.0000 [IU] | Freq: Once | INTRAVENOUS | Status: AC
  Filled 2019-12-11: qty 0.05

## 2019-12-11 MED ORDER — METRONIDAZOLE IN NACL 5-0.79 MG/ML-% IV SOLN
500.0000 mg | Freq: Once | INTRAVENOUS | Status: AC
  Administered 2019-12-12: 500 mg via INTRAVENOUS
  Filled 2019-12-11: qty 100

## 2019-12-11 MED ORDER — SODIUM CHLORIDE 0.9 % IV SOLN
2.0000 g | Freq: Two times a day (BID) | INTRAVENOUS | Status: DC
  Administered 2019-12-12: 2 g via INTRAVENOUS
  Filled 2019-12-11: qty 2

## 2019-12-11 MED ORDER — SODIUM CHLORIDE 0.9 % IV SOLN: INTRAVENOUS | Status: DC

## 2019-12-11 MED ORDER — ONDANSETRON HCL 4 MG/2ML IJ SOLN
4.0000 mg | Freq: Once | INTRAMUSCULAR | Status: AC | PRN
  Administered 2019-12-11: 4 mg via INTRAVENOUS
  Filled 2019-12-11: qty 2

## 2019-12-11 MED ORDER — INSULIN ASPART 100 UNIT/ML ~~LOC~~ SOLN
SUBCUTANEOUS | Status: AC
  Administered 2019-12-12: 5 [IU] via INTRAVENOUS
  Filled 2019-12-11: qty 1

## 2019-12-11 NOTE — ED Triage Notes (Signed)
Pt arrived by [facility]. EMS report pt having N/V/D that started today. EMS admin 4mg  of Zofran IM. Pt has had no relief. Pt actively vomiting in triage.

## 2019-12-11 NOTE — ED Notes (Signed)
Pt vomited 250 mL

## 2019-12-11 NOTE — ED Notes (Signed)
Date and time results received: 12/11/19  2258  Test: Lactic Acid Critical Value: 2.7  Name of Provider Notified: Rogene Houston

## 2019-12-11 NOTE — Progress Notes (Signed)
Pharmacy Antibiotic Note  Amy Nunez is a 59 y.o. female admitted on 12/11/2019 with N/V/D.  Pharmacy has been consulted for vancomycin and Azactam dosing for sepsis.  Patient has anaphylaxis to PCN; however, she has received cefepime in the past (3/8-11/04/19), so will switch Azactam to cefepime per protocol.  SCr 1.25, CrCL 54 ml/min, afebrile, WBC 12.4, LA 2.7.  Plan: Vanc 1000mg  IV x 2 doses for a total of 2gm load, then 1gm IV Q24H for AUC 540 using SCr 1.25 Cefepime 2gm IV Q12H Monitor renal fxn, clinical progress, vanc AUC as indicated  Height: 5' 5.5" (166.4 cm) Weight: 90.7 kg (200 lb) IBW/kg (Calculated) : 58.15  Temp (24hrs), Avg:99.7 F (37.6 C), Min:99.6 F (37.6 C), Max:99.7 F (37.6 C)  Recent Labs  Lab 12/11/19 2157 12/11/19 2202  WBC  --  12.4*  CREATININE 1.25*  --   LATICACIDVEN  --  2.7*    Estimated Creatinine Clearance: 53.8 mL/min (A) (by C-G formula based on SCr of 1.25 mg/dL (H)).    Allergies  Allergen Reactions  . Penicillins Anaphylaxis and Rash    Other reaction(s): Unknown  . Ciprofloxacin Hives  . Red Dye Diarrhea    Other reaction(s): Unknown   Vanc 4/15 >> Cefepime 4/15 >> Flagyl x1 4/15  4/15 covid / flu -  4/15 BCx -   Enola Siebers D. Mina Marble, PharmD, BCPS, [place] 12/12/2019, 12:02 AM

## 2019-12-11 NOTE — ED Notes (Signed)
Pt transported to XR.  

## 2019-12-11 NOTE — ED Notes (Signed)
ED Provider at bedside. 

## 2019-12-11 NOTE — ED Provider Notes (Signed)
[facility] EMERGENCY DEPARTMENT Provider Note   CSN: [id number] Arrival date & time: 12/11/19  2101     History Chief Complaint  Patient presents with  . Emesis    Amy Nunez is a 59 y.o. female.  Patient brought in by EMS.  For nausea vomiting and diarrhea that started today.  EMS gave her Zofran IM.  No apparent relief of vomiting in triage.  Patient status post admission to [facility] in early March for presumed appendicitis.  Patient did have appendectomy pathology showed that it was just fibrotic appendix.  Patient's past medical history is also significant for diabetes hypertension irritable bowel syndrome patient's had abdominal hysterectomy she had the appendectomy she had a nephrectomy on the right.  Some question about whether that was cancer related.  But not able to tell for sure.  Yes she is known to have a history of renal cancer.  Patient states all the symptoms started this morning.  She arrived here with vital signs concerning for possible sepsis she was tachycardic T-max was 99.7's blood pressure was elevated 163/110.  Respiratory rate was up to 24 oxygen saturations on room air was 100%.  Patient denies any blood in the vomit or diarrhea.  No evidence of any gross blood here.        Past Medical History:  Diagnosis Date  . Blood transfusion without reported diagnosis   . Cancer [facility])    renal cancer  . Diabetes mellitus without complication ([place])   . Hypertension   . IBS (irritable bowel syndrome)     Patient Active Problem List   Diagnosis Date Noted  . Appendicitis 11/03/2019  . Acute appendicitis 11/03/2019  . AKI (acute kidney injury) ([place]) 09/19/2019  . Acute kidney injury superimposed on chronic kidney disease ([place]) 09/18/2019  . Abdominal pain 09/03/2019  . Type 2 diabetes mellitus ([place]) 09/03/2019  . Benign essential HTN 09/03/2019  . Irritable bowel syndrome (IBS) 09/03/2019  . Left-sided weakness 09/01/2019    Past  Surgical History:  Procedure Laterality Date  . ABDOMINAL HYSTERECTOMY    . ANKLE SURGERY    . COLONOSCOPY  2018   [facility]  . ESOPHAGOGASTRODUODENOSCOPY  10/15/2016   [facility]  . LAPAROSCOPIC APPENDECTOMY N/A 11/04/2019   Procedure: APPENDECTOMY LAPAROSCOPIC;  Surgeon: Coralie Keens, MD;  Location: [facility] ORS;  Service: General;  Laterality: N/A;  . NEPHRECTOMY Right      OB History   No obstetric history on file.     Family History  Problem Relation Age of Onset  . Hypertension Other   . Colon cancer Neg Hx   . Esophageal cancer Neg Hx     Social History   Tobacco Use  . Smoking status: Former Research scientist (life sciences)  . Smokeless tobacco: Never Used  Substance Use Topics  . Alcohol use: Not Currently  . Drug use: Never    Home Medications Prior to Admission medications   Medication Sig Start Date End Date Taking? Authorizing Provider  acetaminophen (TYLENOL) 500 MG tablet Take 2 tablets (1,000 mg total) by mouth every 6 (six) hours. 11/07/19   Saverio Danker, PA-C  amLODipine (NORVASC) 5 MG tablet Take 1 tablet (5 mg total) by mouth daily. 09/20/19 11/03/19  Kayleen Memos, DO  atorvastatin (LIPITOR) 80 MG tablet Take 80 mg by mouth daily.    [provider]  gabapentin (NEURONTIN) 800 MG tablet Take 1 tablet (800 mg total) by mouth 2 (two) times daily. Patient taking differently: Take 800 mg  by mouth 2 (two) times daily as needed (pain).  09/04/19   Mariel Aloe, MD  HYDROcodone-acetaminophen (NORCO) 10-325 MG tablet Take 1 tablet by mouth every 6 (six) hours as needed (pain).  08/28/19   [provider]  LANTUS SOLOSTAR 100 UNIT/ML Solostar Pen Inject 5 Units into the skin at bedtime. 09/20/19   Kayleen Memos, DO  lisinopril (ZESTRIL) 40 MG tablet Take 0.5 tablets (20 mg total) by mouth daily. 11/08/19   Georgette Shell, MD  morphine (MS CONTIN) 30 MG 12 hr tablet Take 30 mg by mouth every 12 (twelve) hours. 08/28/19   [provider]  ondansetron (ZOFRAN) 4 MG  tablet Take 1 tablet (4 mg total) by mouth daily as needed for nausea or vomiting. 11/08/19 11/07/20  Georgette Shell, MD  polyethylene glycol (MIRALAX / GLYCOLAX) 17 g packet Take 17 g by mouth 2 (two) times daily. 11/08/19   Georgette Shell, MD  promethazine (PHENERGAN) 25 MG tablet Take 25 mg by mouth daily.    [provider]  QUEtiapine (SEROQUEL) 200 MG tablet Take 1-2 tablets by mouth See admin instructions. Take 1 tablet in the morning and 2 tablets at night. 07/17/19   [provider]  spironolactone (ALDACTONE) 50 MG tablet Take 50 mg by mouth daily.  10/07/19   [provider]  tiZANidine (ZANAFLEX) 4 MG tablet Take 4 mg by mouth every 8 (eight) hours as needed for muscle spasms.  08/25/19   [provider]    Allergies    Penicillins, Ciprofloxacin, and Red dye  Review of Systems   Review of Systems  Constitutional: Negative for chills and fever.  HENT: Negative for congestion, rhinorrhea and sore throat.   Eyes: Negative for visual disturbance.  Respiratory: Negative for cough and shortness of breath.   Cardiovascular: Negative for chest pain and leg swelling.  Gastrointestinal: Positive for abdominal pain, diarrhea, nausea and vomiting. Negative for blood in stool.  Genitourinary: Negative for dysuria.  Musculoskeletal: Negative for back pain and neck pain.  Skin: Negative for rash.  Neurological: Negative for dizziness, light-headedness and headaches.  Hematological: Does not bruise/bleed easily.  Psychiatric/Behavioral: Negative for confusion.    Physical Exam Updated Vital Signs BP (!) 172/95   Pulse 99   Temp 99.6 F (37.6 C) (Rectal)   Resp (!) 23   Ht 1.664 m (5' 5.5")   Wt 90.7 kg   SpO2 100%   BMI 32.78 kg/m   Physical Exam Vitals and nursing note reviewed.  Constitutional:      General: She is not in acute distress.    Appearance: Normal appearance. She is well-developed.  HENT:     Head: Normocephalic and  atraumatic.     Mouth/Throat:     Mouth: Mucous membranes are dry.  Eyes:     Extraocular Movements: Extraocular movements intact.     Conjunctiva/sclera: Conjunctivae normal.     Pupils: Pupils are equal, round, and reactive to light.  Cardiovascular:     Rate and Rhythm: Regular rhythm. Tachycardia present.     Heart sounds: No murmur.  Pulmonary:     Effort: Pulmonary effort is normal. No respiratory distress.     Breath sounds: Normal breath sounds. No wheezing.  Abdominal:     General: There is no distension.     Palpations: Abdomen is soft.     Tenderness: There is abdominal tenderness.     Comments: Generalized tenderness out throughout the abdomen no guarding.  Musculoskeletal:        General: No swelling. Normal range of motion.     Cervical back: Normal range of motion and neck supple.  Skin:    General: Skin is warm and dry.  Neurological:     General: No focal deficit present.     Mental Status: She is alert.     Cranial Nerves: No cranial nerve deficit.     Sensory: No sensory deficit.     Motor: No weakness.     ED Results / Procedures / Treatments   Labs (all labs ordered are listed, but only abnormal results are displayed) Labs Reviewed  COMPREHENSIVE METABOLIC PANEL - Abnormal; Notable for the following components:      Result Value   Sodium 131 (*)    Chloride 97 (*)    CO2 18 (*)    Glucose, Bld 337 (*)    BUN 22 (*)    Creatinine, Ser 1.25 (*)    Total Protein 8.6 (*)    GFR calc non Af Amer 47 (*)    GFR calc Af Amer 54 (*)    Anion gap 16 (*)    All other components within normal limits  LACTIC ACID, PLASMA - Abnormal; Notable for the following components:   Lactic Acid, Venous 2.7 (*)    All other components within normal limits  LACTIC ACID, PLASMA - Abnormal; Notable for the following components:   Lactic Acid, Venous 3.1 (*)    All other components within normal limits  CBC WITH DIFFERENTIAL/PLATELET - Abnormal; Notable for the  following components:   WBC 12.4 (*)    HCT 35.6 (*)    Platelets 402 (*)    Neutro Abs 11.0 (*)    Abs Immature Granulocytes 0.15 (*)    All other components within normal limits  CULTURE, BLOOD (ROUTINE X 2)  CULTURE, BLOOD (ROUTINE X 2)  RESPIRATORY PANEL BY RT PCR (FLU A&B, COVID)  LIPASE, BLOOD  PROTIME-INR  URINALYSIS, ROUTINE W REFLEX MICROSCOPIC  PREGNANCY, URINE    EKG EKG Interpretation  Date/Time:  Thursday December 11 2019 23:54:55 EDT Ventricular Rate:  98 PR Interval:    QRS Duration: 85 QT Interval:  381 QTC Calculation: 487 R Axis:     Text Interpretation: Sinus rhythm Probable left atrial enlargement Borderline prolonged QT interval No significant change since last tracing Confirmed by Fredia Sorrow [phone]) on 12/11/2019 11:59:06 PM   Radiology DG Chest 2 View  Result Date: 12/11/2019 CLINICAL DATA:  Sepsis, nausea/vomiting/diarrhea, fever EXAM: CHEST - 2 VIEW COMPARISON:  09/08/2019 FINDINGS: Frontal and lateral views of the chest demonstrate a stable cardiac silhouette. No airspace disease, effusion, or pneumothorax. Postsurgical changes from right nephrectomy. No acute bony abnormalities. IMPRESSION: 1. No acute intrathoracic process. Electronically Signed   By: Randa Ngo M.D.   On: 12/11/2019 22:26   CT Abdomen Pelvis W Contrast  Result Date: 12/11/2019 CLINICAL DATA:  Nausea, vomiting, diarrhea for 1 day. History of right renal cell carcinoma status post nephrectomy EXAM: CT ABDOMEN AND PELVIS WITH CONTRAST TECHNIQUE: Multidetector CT imaging of the abdomen and pelvis was performed using the standard protocol following bolus administration of intravenous contrast. CONTRAST:  14mL OMNIPAQUE IOHEXOL 300 MG/ML  SOLN COMPARISON:  11/03/2019 FINDINGS: Lower chest: Visualized portions of the lung bases are clear. Hepatobiliary: No focal liver abnormality is seen. No gallstones, gallbladder wall thickening, or biliary dilatation. Pancreas: Unremarkable. No  pancreatic ductal dilatation or surrounding inflammatory changes. Spleen: Normal in size without  focal abnormality. Adrenals/Urinary Tract: Postsurgical changes from right nephrectomy. Compensatory hypertrophy of the left kidney. Stable left renal cyst. No obstructive uropathy. The bladder is unremarkable. The adrenals are normal. Stomach/Bowel: No bowel obstruction or ileus. Minimal diverticulosis of the descending colon without diverticulitis. The appendix is surgically absent. No bowel wall thickening or inflammatory change. There is a small hiatal hernia. Vascular/Lymphatic: Aortic atherosclerosis. No enlarged abdominal or pelvic lymph nodes. Reproductive: Status post hysterectomy. No adnexal masses. Other: No abdominal wall hernia or abnormality. No abdominopelvic ascites. Musculoskeletal: No acute or destructive bony lesions. Reconstructed images demonstrate no additional findings. IMPRESSION: 1. No acute intra-abdominal or intrapelvic process. 2. Stable postsurgical changes from right nephrectomy. 3. Stable hiatal hernia. 4.  Aortic Atherosclerosis (ICD10-I70.0). Electronically Signed   By: Randa Ngo M.D.   On: 12/11/2019 23:17    Procedures Procedures (including critical care time)  CRITICAL CARE Performed by: Fredia Sorrow Total critical care time: 30 minutes Critical care time was exclusive of separately billable procedures and treating other patients. Critical care was necessary to treat or prevent imminent or life-threatening deterioration. Critical care was time spent personally by me on the following activities: development of treatment plan with patient and/or surrogate as well as nursing, discussions with consultants, evaluation of patient's response to treatment, examination of patient, obtaining history from patient or surrogate, ordering and performing treatments and interventions, ordering and review of laboratory studies, ordering and review of radiographic studies, pulse  oximetry and re-evaluation of patient's condition.   Medications Ordered in ED Medications  0.9 %  sodium chloride infusion ( Intravenous New Bag/Given 12/11/19 2337)  metroNIDAZOLE (FLAGYL) IVPB 500 mg (500 mg Intravenous New Bag/Given 12/12/19 0001)  ceFEPIme (MAXIPIME) 2 g in sodium chloride 0.9 % 100 mL IVPB (2 g Intravenous New Bag/Given 12/12/19 0017)  vancomycin (VANCOCIN) IVPB 1000 mg/200 mL premix (has no administration in time range)  vancomycin (VANCOCIN) IVPB 1000 mg/200 mL premix (has no administration in time range)  ondansetron (ZOFRAN) injection 4 mg (4 mg Intravenous Given 12/11/19 2153)  sodium chloride 0.9 % bolus 500 mL (0 mLs Intravenous Stopped 12/11/19 2338)  ondansetron (ZOFRAN) injection 4 mg (4 mg Intravenous Given 12/11/19 2248)  HYDROmorphone (DILAUDID) injection 0.5 mg (0.5 mg Intravenous Given 12/11/19 2250)  iohexol (OMNIPAQUE) 300 MG/ML solution 100 mL (80 mLs Intravenous Contrast Given 12/11/19 2252)  sodium chloride 0.9 % bolus 500 mL (0 mLs Intravenous Stopped 12/12/19 0035)  insulin aspart (novoLOG) injection 5 Units (5 Units Intravenous Given 12/12/19 0002)  ondansetron (ZOFRAN) injection 4 mg (4 mg Intravenous Given 12/12/19 0000)    ED Course  I have reviewed the triage vital signs and the nursing notes.  Pertinent labs & imaging results that were available during my care of the patient were reviewed by me and considered in my medical decision making (see chart for details).    MDM Rules/Calculators/A&P                      Based on patient's tachycardia and tachypnea.  Patient started on sepsis protocol.  Started on broad-spectrum antibiotics.  Patient given additional Zofran.  EKG just showed kind of a sinus tachycardia.  Patient received 1 L of fluid.  There is a leukocytosis but her white blood cell counts are less than 14,000.  She is never been hypotensive she is actually been hypertensive.  Patient's initial lactic acid was in the upper twos at 2.7.   After the fluids though repeat was 3.1 so going  in the wrong direction.  Her electrolytes CO2 showed level of 18.  Blood sugars were elevated.  Patient receiving IV fluids.  Patient received some IV insulin.  Chest x-ray without any acute findings CT of the abdomen without any acute findings to explain symptoms.  Patient with persistent vomiting.  No blood in the vomit.  Patient will require admission.  Presumed sepsis but no source.  Could just be a persistent nausea vomiting and diarrhea.  No acute abdominal process.  Patient hyperglycemic patient acidotic patient persistent hypertension.  Discussed with hospitalist for admission.   Final Clinical Impression(s) / ED Diagnoses Final diagnoses:  Nausea vomiting and diarrhea  Sepsis, due to unspecified organism, unspecified whether acute organ dysfunction present [facility])  Generalized abdominal pain  Hyperglycemia  Essential hypertension    Rx / DC Orders ED Discharge Orders    None       Fredia Sorrow, MD 12/12/19 [phone]

## 2019-12-11 NOTE — ED Notes (Signed)
Pt transported to CT ?

## 2019-12-12 DIAGNOSIS — I5030 Unspecified diastolic (congestive) heart failure: Secondary | ICD-10-CM | POA: Diagnosis present

## 2019-12-12 DIAGNOSIS — I10 Essential (primary) hypertension: Secondary | ICD-10-CM

## 2019-12-12 DIAGNOSIS — R112 Nausea with vomiting, unspecified: Secondary | ICD-10-CM

## 2019-12-12 DIAGNOSIS — Z9102 Food additives allergy status: Secondary | ICD-10-CM | POA: Diagnosis not present

## 2019-12-12 DIAGNOSIS — E119 Type 2 diabetes mellitus without complications: Secondary | ICD-10-CM | POA: Diagnosis not present

## 2019-12-12 DIAGNOSIS — K580 Irritable bowel syndrome with diarrhea: Secondary | ICD-10-CM | POA: Diagnosis present

## 2019-12-12 DIAGNOSIS — E871 Hypo-osmolality and hyponatremia: Secondary | ICD-10-CM | POA: Diagnosis present

## 2019-12-12 DIAGNOSIS — Z881 Allergy status to other antibiotic agents status: Secondary | ICD-10-CM | POA: Diagnosis not present

## 2019-12-12 DIAGNOSIS — R1084 Generalized abdominal pain: Secondary | ICD-10-CM

## 2019-12-12 DIAGNOSIS — R0609 Other forms of dyspnea: Secondary | ICD-10-CM | POA: Diagnosis not present

## 2019-12-12 DIAGNOSIS — Z794 Long term (current) use of insulin: Secondary | ICD-10-CM

## 2019-12-12 DIAGNOSIS — R109 Unspecified abdominal pain: Secondary | ICD-10-CM

## 2019-12-12 DIAGNOSIS — Z20822 Contact with and (suspected) exposure to covid-19: Secondary | ICD-10-CM | POA: Diagnosis present

## 2019-12-12 DIAGNOSIS — Z85528 Personal history of other malignant neoplasm of kidney: Secondary | ICD-10-CM | POA: Diagnosis not present

## 2019-12-12 DIAGNOSIS — K5900 Constipation, unspecified: Secondary | ICD-10-CM | POA: Diagnosis not present

## 2019-12-12 DIAGNOSIS — K5903 Drug induced constipation: Secondary | ICD-10-CM | POA: Diagnosis present

## 2019-12-12 DIAGNOSIS — E1122 Type 2 diabetes mellitus with diabetic chronic kidney disease: Secondary | ICD-10-CM | POA: Diagnosis present

## 2019-12-12 DIAGNOSIS — Z880 Allergy status to penicillin: Secondary | ICD-10-CM | POA: Diagnosis not present

## 2019-12-12 DIAGNOSIS — R6510 Systemic inflammatory response syndrome (SIRS) of non-infectious origin without acute organ dysfunction: Secondary | ICD-10-CM | POA: Diagnosis present

## 2019-12-12 DIAGNOSIS — T402X5A Adverse effect of other opioids, initial encounter: Secondary | ICD-10-CM | POA: Diagnosis present

## 2019-12-12 DIAGNOSIS — Z87891 Personal history of nicotine dependence: Secondary | ICD-10-CM | POA: Diagnosis not present

## 2019-12-12 DIAGNOSIS — I130 Hypertensive heart and chronic kidney disease with heart failure and stage 1 through stage 4 chronic kidney disease, or unspecified chronic kidney disease: Secondary | ICD-10-CM | POA: Diagnosis present

## 2019-12-12 DIAGNOSIS — E872 Acidosis: Secondary | ICD-10-CM | POA: Diagnosis present

## 2019-12-12 DIAGNOSIS — Z886 Allergy status to analgesic agent status: Secondary | ICD-10-CM | POA: Diagnosis not present

## 2019-12-12 DIAGNOSIS — N1832 Chronic kidney disease, stage 3b: Secondary | ICD-10-CM | POA: Diagnosis present

## 2019-12-12 DIAGNOSIS — G894 Chronic pain syndrome: Secondary | ICD-10-CM | POA: Diagnosis present

## 2019-12-12 DIAGNOSIS — R197 Diarrhea, unspecified: Secondary | ICD-10-CM | POA: Diagnosis present

## 2019-12-12 DIAGNOSIS — Z905 Acquired absence of kidney: Secondary | ICD-10-CM | POA: Diagnosis not present

## 2019-12-12 LAB — URINALYSIS, ROUTINE W REFLEX MICROSCOPIC
Bilirubin Urine: NEGATIVE
Glucose, UA: 250 mg/dL — AB
Ketones, ur: 15 mg/dL — AB
Leukocytes,Ua: NEGATIVE
Nitrite: NEGATIVE
Protein, ur: 30 mg/dL — AB
Specific Gravity, Urine: 1.01 (ref 1.005–1.030)
pH: 7.5 (ref 5.0–8.0)

## 2019-12-12 LAB — URINALYSIS, MICROSCOPIC (REFLEX)

## 2019-12-12 LAB — CBC
HCT: 39.7 % (ref 36.0–46.0)
Hemoglobin: 13.4 g/dL (ref 12.0–15.0)
MCH: 31.9 pg (ref 26.0–34.0)
MCHC: 33.8 g/dL (ref 30.0–36.0)
MCV: 94.5 fL (ref 80.0–100.0)
Platelets: 355 10*3/uL (ref 150–400)
RBC: 4.2 MIL/uL (ref 3.87–5.11)
RDW: 13.8 % (ref 11.5–15.5)
WBC: 24.4 10*3/uL — ABNORMAL HIGH (ref 4.0–10.5)
nRBC: 0 % (ref 0.0–0.2)

## 2019-12-12 LAB — BASIC METABOLIC PANEL
Anion gap: 14 (ref 5–15)
BUN: 20 mg/dL (ref 6–20)
CO2: 18 mmol/L — ABNORMAL LOW (ref 22–32)
Calcium: 9.4 mg/dL (ref 8.9–10.3)
Chloride: 102 mmol/L (ref 98–111)
Creatinine, Ser: 1.21 mg/dL — ABNORMAL HIGH (ref 0.44–1.00)
GFR calc Af Amer: 56 mL/min — ABNORMAL LOW (ref >60)
GFR calc non Af Amer: 49 mL/min — ABNORMAL LOW (ref >60)
Glucose, Bld: 278 mg/dL — ABNORMAL HIGH (ref 70–99)
Potassium: 4.2 mmol/L (ref 3.5–5.1)
Sodium: 134 mmol/L — ABNORMAL LOW (ref 135–145)

## 2019-12-12 LAB — GLUCOSE, CAPILLARY
Glucose-Capillary: 267 mg/dL — ABNORMAL HIGH (ref 70–99)
Glucose-Capillary: 247 mg/dL — ABNORMAL HIGH (ref 70–99)
Glucose-Capillary: 162 mg/dL — ABNORMAL HIGH (ref 70–99)
Glucose-Capillary: 149 mg/dL — ABNORMAL HIGH (ref 70–99)
Glucose-Capillary: 182 mg/dL — ABNORMAL HIGH (ref 70–99)

## 2019-12-12 LAB — LACTIC ACID, PLASMA
Lactic Acid, Venous: 2 mmol/L (ref 0.5–1.9)
Lactic Acid, Venous: 2.1 mmol/L (ref 0.5–1.9)
Lactic Acid, Venous: 3.1 mmol/L (ref 0.5–1.9)

## 2019-12-12 LAB — RESPIRATORY PANEL BY RT PCR (FLU A&B, COVID)
Influenza A by PCR: NEGATIVE
Influenza B by PCR: NEGATIVE
SARS Coronavirus 2 by RT PCR: NEGATIVE

## 2019-12-12 LAB — MRSA PCR SCREENING: MRSA by PCR: NEGATIVE

## 2019-12-12 LAB — PREGNANCY, URINE: Preg Test, Ur: NEGATIVE

## 2019-12-12 LAB — HEMOGLOBIN A1C
Hgb A1c MFr Bld: 7.7 % — ABNORMAL HIGH (ref 4.8–5.6)
Mean Plasma Glucose: 174.29 mg/dL

## 2019-12-12 MED ORDER — LABETALOL HCL 5 MG/ML IV SOLN
10.0000 mg | Freq: Once | INTRAVENOUS | Status: AC
  Administered 2019-12-12: 10 mg via INTRAVENOUS
  Filled 2019-12-12: qty 4

## 2019-12-12 MED ORDER — VANCOMYCIN HCL IN DEXTROSE 1-5 GM/200ML-% IV SOLN
1000.0000 mg | INTRAVENOUS | Status: AC
  Administered 2019-12-12: 1000 mg via INTRAVENOUS
  Filled 2019-12-12 (×2): qty 200

## 2019-12-12 MED ORDER — HYDROMORPHONE HCL 1 MG/ML IJ SOLN
0.5000 mg | INTRAMUSCULAR | Status: DC | PRN
  Administered 2019-12-12: 1 mg via INTRAVENOUS
  Filled 2019-12-12: qty 1

## 2019-12-12 MED ORDER — QUETIAPINE FUMARATE 100 MG PO TABS
200.0000 mg | ORAL_TABLET | Freq: Every day | ORAL | Status: DC
  Administered 2019-12-13 – 2019-12-18 (×6): 200 mg via ORAL
  Filled 2019-12-12 (×3): qty 2
  Filled 2019-12-12: qty 8
  Filled 2019-12-12 (×2): qty 2

## 2019-12-12 MED ORDER — LORAZEPAM 2 MG/ML IJ SOLN
1.0000 mg | Freq: Once | INTRAMUSCULAR | Status: AC
  Administered 2019-12-12: 1 mg via INTRAVENOUS
  Filled 2019-12-12: qty 1

## 2019-12-12 MED ORDER — METRONIDAZOLE IN NACL 5-0.79 MG/ML-% IV SOLN
500.0000 mg | Freq: Three times a day (TID) | INTRAVENOUS | Status: DC
  Administered 2019-12-12: 500 mg via INTRAVENOUS
  Filled 2019-12-12: qty 100

## 2019-12-12 MED ORDER — PROMETHAZINE HCL 25 MG/ML IJ SOLN
12.5000 mg | INTRAMUSCULAR | Status: DC | PRN
  Administered 2019-12-12 – 2019-12-17 (×10): 12.5 mg via INTRAVENOUS
  Filled 2019-12-12 (×10): qty 1

## 2019-12-12 MED ORDER — CHLORHEXIDINE GLUCONATE CLOTH 2 % EX PADS: 6.0000 | MEDICATED_PAD | Freq: Every day | CUTANEOUS | Status: DC

## 2019-12-12 MED ORDER — MORPHINE SULFATE (PF) 2 MG/ML IV SOLN
1.0000 mg | INTRAVENOUS | Status: DC | PRN
  Administered 2019-12-12 – 2019-12-17 (×11): 1 mg via INTRAVENOUS
  Filled 2019-12-12 (×11): qty 1

## 2019-12-12 MED ORDER — AMLODIPINE BESYLATE 10 MG PO TABS
10.0000 mg | ORAL_TABLET | Freq: Every day | ORAL | Status: DC
  Administered 2019-12-13 – 2019-12-18 (×6): 10 mg via ORAL
  Filled 2019-12-12 (×6): qty 1

## 2019-12-12 MED ORDER — INSULIN ASPART 100 UNIT/ML ~~LOC~~ SOLN
0.0000 [IU] | SUBCUTANEOUS | Status: DC
  Administered 2019-12-12 (×2): 2 [IU] via SUBCUTANEOUS
  Administered 2019-12-12: 3 [IU] via SUBCUTANEOUS
  Administered 2019-12-12: 5 [IU] via SUBCUTANEOUS
  Administered 2019-12-12 – 2019-12-13 (×3): 1 [IU] via SUBCUTANEOUS
  Administered 2019-12-13: 2 [IU] via SUBCUTANEOUS
  Administered 2019-12-14 (×2): 1 [IU] via SUBCUTANEOUS
  Administered 2019-12-14 (×2): 3 [IU] via SUBCUTANEOUS
  Administered 2019-12-15 (×4): 1 [IU] via SUBCUTANEOUS
  Administered 2019-12-15: 5 [IU] via SUBCUTANEOUS
  Administered 2019-12-16: 1 [IU] via SUBCUTANEOUS
  Administered 2019-12-16: 3 [IU] via SUBCUTANEOUS
  Administered 2019-12-16 (×2): 1 [IU] via SUBCUTANEOUS
  Administered 2019-12-16 (×2): 2 [IU] via SUBCUTANEOUS
  Administered 2019-12-17: 1 [IU] via SUBCUTANEOUS
  Administered 2019-12-17: 3 [IU] via SUBCUTANEOUS

## 2019-12-12 MED ORDER — SODIUM CHLORIDE 0.9 % IV SOLN: INTRAVENOUS | Status: DC

## 2019-12-12 MED ORDER — INSULIN GLARGINE 100 UNIT/ML ~~LOC~~ SOLN
5.0000 [IU] | Freq: Every day | SUBCUTANEOUS | Status: DC
  Administered 2019-12-12 – 2019-12-17 (×6): 5 [IU] via SUBCUTANEOUS
  Filled 2019-12-12 (×7): qty 0.05

## 2019-12-12 MED ORDER — AMLODIPINE BESYLATE 5 MG PO TABS
5.0000 mg | ORAL_TABLET | Freq: Every day | ORAL | Status: DC
  Administered 2019-12-12: 5 mg via ORAL
  Filled 2019-12-12: qty 1

## 2019-12-12 MED ORDER — CHLORHEXIDINE GLUCONATE CLOTH 2 % EX PADS
6.0000 | MEDICATED_PAD | Freq: Every day | CUTANEOUS | Status: DC
  Administered 2019-12-12 – 2019-12-17 (×6): 6 via TOPICAL

## 2019-12-12 MED ORDER — ATORVASTATIN CALCIUM 40 MG PO TABS
80.0000 mg | ORAL_TABLET | Freq: Every day | ORAL | Status: DC
  Administered 2019-12-12 – 2019-12-17 (×6): 80 mg via ORAL
  Filled 2019-12-12 (×7): qty 2

## 2019-12-12 MED ORDER — HYDRALAZINE HCL 50 MG PO TABS
50.0000 mg | ORAL_TABLET | Freq: Three times a day (TID) | ORAL | Status: DC
  Administered 2019-12-12 – 2019-12-15 (×9): 50 mg via ORAL
  Filled 2019-12-12 (×10): qty 1

## 2019-12-12 MED ORDER — QUETIAPINE FUMARATE 100 MG PO TABS: 200.0000 mg | ORAL_TABLET | ORAL | Status: DC

## 2019-12-12 MED ORDER — LISINOPRIL 20 MG PO TABS
20.0000 mg | ORAL_TABLET | Freq: Every day | ORAL | Status: DC
  Administered 2019-12-12 – 2019-12-18 (×7): 20 mg via ORAL
  Filled 2019-12-12 (×6): qty 1
  Filled 2019-12-12: qty 2

## 2019-12-12 MED ORDER — AMLODIPINE BESYLATE 5 MG PO TABS
5.0000 mg | ORAL_TABLET | Freq: Once | ORAL | Status: AC
  Administered 2019-12-12: 5 mg via ORAL
  Filled 2019-12-12: qty 1

## 2019-12-12 MED ORDER — PANTOPRAZOLE SODIUM 40 MG IV SOLR
40.0000 mg | Freq: Two times a day (BID) | INTRAVENOUS | Status: DC
  Administered 2019-12-12 – 2019-12-16 (×10): 40 mg via INTRAVENOUS
  Filled 2019-12-12 (×11): qty 40

## 2019-12-12 MED ORDER — LABETALOL HCL 5 MG/ML IV SOLN
20.0000 mg | INTRAVENOUS | Status: DC | PRN
  Administered 2019-12-12: 20 mg via INTRAVENOUS
  Filled 2019-12-12 (×2): qty 4

## 2019-12-12 MED ORDER — ACETAMINOPHEN 325 MG PO TABS
650.0000 mg | ORAL_TABLET | Freq: Four times a day (QID) | ORAL | Status: DC | PRN
  Administered 2019-12-16 – 2019-12-17 (×2): 650 mg via ORAL
  Filled 2019-12-12 (×2): qty 2

## 2019-12-12 MED ORDER — GABAPENTIN 400 MG PO CAPS
800.0000 mg | ORAL_CAPSULE | Freq: Two times a day (BID) | ORAL | Status: DC | PRN
  Administered 2019-12-13: 800 mg via ORAL
  Filled 2019-12-12: qty 2

## 2019-12-12 MED ORDER — MORPHINE SULFATE ER 30 MG PO TBCR
30.0000 mg | EXTENDED_RELEASE_TABLET | Freq: Two times a day (BID) | ORAL | Status: DC
  Administered 2019-12-12 – 2019-12-18 (×12): 30 mg via ORAL
  Filled 2019-12-12 (×13): qty 1

## 2019-12-12 MED ORDER — ENOXAPARIN SODIUM 40 MG/0.4ML ~~LOC~~ SOLN
40.0000 mg | Freq: Every day | SUBCUTANEOUS | Status: DC
  Administered 2019-12-12 – 2019-12-18 (×7): 40 mg via SUBCUTANEOUS
  Filled 2019-12-12 (×7): qty 0.4

## 2019-12-12 MED ORDER — HYDRALAZINE HCL 20 MG/ML IJ SOLN
10.0000 mg | Freq: Once | INTRAMUSCULAR | Status: AC
  Administered 2019-12-12: 10 mg via INTRAVENOUS
  Filled 2019-12-12: qty 1

## 2019-12-12 MED ORDER — QUETIAPINE FUMARATE 100 MG PO TABS
400.0000 mg | ORAL_TABLET | Freq: Every day | ORAL | Status: DC
  Administered 2019-12-12 – 2019-12-17 (×6): 400 mg via ORAL
  Filled 2019-12-12 (×6): qty 4

## 2019-12-12 MED ORDER — ONDANSETRON HCL 4 MG/2ML IJ SOLN
4.0000 mg | Freq: Four times a day (QID) | INTRAMUSCULAR | Status: DC | PRN
  Administered 2019-12-12 (×2): 4 mg via INTRAVENOUS
  Filled 2019-12-12 (×2): qty 2

## 2019-12-12 NOTE — Progress Notes (Signed)
CRITICAL VALUE ALERT  Critical Value:  Lactic Acid 2.0  Date & Time Notied:  12/12/2019 06:50AM   Provider Notified: [facility]   Orders Received/Actions taken: No new orders at current time

## 2019-12-12 NOTE — Care Plan (Signed)
59 yo htn, DM  Had apendectomy/ nephrectomy in feb Has N/v/D WBC 12.4 low grade fever Wanted to have her admitted for rehydration CT unremarkable, CXr Lactic acid 2.7- 3.1 Got IVF Gave flagyl, cefepime and vanc covid negative Blood cult obtained SIRS unknown source  CK and VBG pending obs repeat BMEt Accept to stepdown  Amy Nunez 1:28 AM

## 2019-12-12 NOTE — ED Notes (Signed)
Date and time results received: 04/16/210024   Test: lactic acid Critical Value: 3.1  Name of Provider Notified: Rogene Houston

## 2019-12-12 NOTE — Progress Notes (Signed)
Patient has been admitted into the [facility] unit. Patient was vomiting and dry heaving. Patient BP was noted at 193/106. Medications was given for both problems.

## 2019-12-12 NOTE — H&P (Signed)
History and Physical    Amy Nunez [id number] DOB: 09-21-1960 DOA: 12/11/2019  PCP: Einar Pheasant, DO  Patient coming from: Home  I have personally briefly reviewed patient's old medical records in [place]  Chief Complaint: N/V  HPI: Amy Nunez is a 59 y.o. female with medical history significant of DM2, HTN.  Renal cancer s/p R nephrectomy in Feb, also s/p appendectomy.  Pt presents to ED at [facility] with c/o N/V/D onset yesterday.  Associated generalized abd pain.  Vomiting persisted initially despite zofran.  Report of fever.  Emesis is NBNB.   ED Course: Tm 99.7, Lactates were 2.7 and up to 3.1 on repeat in ED.  Given 500cc NS bolus and put on 75 cc/hr overnight.  Put on empiric cefepime, vanc, flagyl.  CXR neg, UA neg, CT abd/pelvis neg for anything acute (just expected post op changes from nephrectomy).   Review of Systems: As per HPI, otherwise all review of systems negative.  Past Medical History:  Diagnosis Date  . Blood transfusion without reported diagnosis   . Cancer [facility])    renal cancer  . Diabetes mellitus without complication ([place])   . Hypertension   . IBS (irritable bowel syndrome)     Past Surgical History:  Procedure Laterality Date  . ABDOMINAL HYSTERECTOMY    . ANKLE SURGERY    . COLONOSCOPY  2018   [facility]  . ESOPHAGOGASTRODUODENOSCOPY  10/15/2016   [facility]  . LAPAROSCOPIC APPENDECTOMY N/A 11/04/2019   Procedure: APPENDECTOMY LAPAROSCOPIC;  Surgeon: Coralie Keens, MD;  Location: [facility] ORS;  Service: General;  Laterality: N/A;  . NEPHRECTOMY Right      reports that she has quit smoking. She has never used smokeless tobacco. She reports previous alcohol use. She reports that she does not use drugs.  Allergies  Allergen Reactions  . Penicillins Anaphylaxis and Rash    Other reaction(s): Unknown  . Ciprofloxacin Hives  . Ibuprofen Nausea And Vomiting  . Red Dye Diarrhea    Other reaction(s): Unknown    Family  History  Problem Relation Age of Onset  . Hypertension Other   . Colon cancer Neg Hx   . Esophageal cancer Neg Hx      Prior to Admission medications   Medication Sig Start Date End Date Taking? Authorizing Provider  acetaminophen (TYLENOL) 500 MG tablet Take 2 tablets (1,000 mg total) by mouth every 6 (six) hours. 11/07/19   Saverio Danker, PA-C  amLODipine (NORVASC) 5 MG tablet Take 1 tablet (5 mg total) by mouth daily. 09/20/19 11/03/19  Kayleen Memos, DO  atorvastatin (LIPITOR) 80 MG tablet Take 80 mg by mouth daily.    [provider]  gabapentin (NEURONTIN) 800 MG tablet Take 1 tablet (800 mg total) by mouth 2 (two) times daily. Patient taking differently: Take 800 mg by mouth 2 (two) times daily as needed (pain).  09/04/19   Mariel Aloe, MD  HYDROcodone-acetaminophen (NORCO) 10-325 MG tablet Take 1 tablet by mouth every 6 (six) hours as needed (pain).  08/28/19   [provider]  LANTUS SOLOSTAR 100 UNIT/ML Solostar Pen Inject 5 Units into the skin at bedtime. 09/20/19   Kayleen Memos, DO  lisinopril (ZESTRIL) 40 MG tablet Take 0.5 tablets (20 mg total) by mouth daily. 11/08/19   Georgette Shell, MD  morphine (MS CONTIN) 30 MG 12 hr tablet Take 30 mg by mouth every 12 (twelve) hours. 08/28/19   [provider]  ondansetron (ZOFRAN) 4  MG tablet Take 1 tablet (4 mg total) by mouth daily as needed for nausea or vomiting. 11/08/19 11/07/20  Georgette Shell, MD  polyethylene glycol (MIRALAX / GLYCOLAX) 17 g packet Take 17 g by mouth 2 (two) times daily. 11/08/19   Georgette Shell, MD  promethazine (PHENERGAN) 25 MG tablet Take 25 mg by mouth daily.    [provider]  QUEtiapine (SEROQUEL) 200 MG tablet Take 1-2 tablets by mouth See admin instructions. Take 1 tablet in the morning and 2 tablets at night. 07/17/19   [provider]  spironolactone (ALDACTONE) 50 MG tablet Take 50 mg by mouth daily.  10/07/19   [provider]    tiZANidine (ZANAFLEX) 4 MG tablet Take 4 mg by mouth every 8 (eight) hours as needed for muscle spasms.  08/25/19   [provider]    Physical Exam: Vitals:   12/12/19 0000 12/12/19 0030 12/12/19 0116 12/12/19 0411  BP: (!) 156/96 (!) 172/95 (!) 170/106 (!) 193/116  Pulse: (!) 107 99 (!) 108 91  Resp: (!) 23 (!) 23 13 (!) 27  Temp:    98.6 F (37 C)  TempSrc:    Oral  SpO2: 100% 100% 100% 93%  Weight:    87.2 kg  Height:        Constitutional: NAD, calm, comfortable Eyes: PERRL, lids and conjunctivae normal ENMT: Mucous membranes are moist. Posterior pharynx clear of any exudate or lesions.Normal dentition.  Neck: normal, supple, no masses, no thyromegaly Respiratory: clear to auscultation bilaterally, no wheezing, no crackles. Normal respiratory effort. No accessory muscle use.  Cardiovascular: Regular rate and rhythm, no murmurs / rubs / gallops. No extremity edema. 2+ pedal pulses. No carotid bruits.  Abdomen: Generalized TTP Musculoskeletal: no clubbing / cyanosis. No joint deformity upper and lower extremities. Good ROM, no contractures. Normal muscle tone.  Skin: no rashes, lesions, ulcers. No induration Neurologic: CN 2-12 grossly intact. Sensation intact, DTR normal. Strength 5/5 in all 4.  Psychiatric: Normal judgment and insight. Alert and oriented x 3. Normal mood.    Labs on Admission: I have personally reviewed following labs and imaging studies  CBC: Recent Labs  Lab 12/11/19 2202  WBC 12.4*  NEUTROABS 11.0*  HGB 12.7  HCT 35.6*  MCV 91.0  PLT [id number]*   Basic Metabolic Panel: Recent Labs  Lab 12/11/19 2157  NA 131*  K 4.6  CL 97*  CO2 18*  GLUCOSE 337*  BUN 22*  CREATININE 1.25*  CALCIUM 9.9   GFR: Estimated Creatinine Clearance: 52.7 mL/min (A) (by C-G formula based on SCr of 1.25 mg/dL (H)). Liver Function Tests: Recent Labs  Lab 12/11/19 2157  AST 27  ALT 24  ALKPHOS 123  BILITOT 0.7  PROT 8.6*  ALBUMIN 4.7   Recent Labs   Lab 12/11/19 2157  LIPASE 37   No results for input(s): AMMONIA in the last 168 hours. Coagulation Profile: Recent Labs  Lab 12/11/19 2202  INR 1.0   Cardiac Enzymes: No results for input(s): CKTOTAL, CKMB, CKMBINDEX, TROPONINI in the last 168 hours. BNP (last 3 results) No results for input(s): PROBNP in the last 8760 hours. HbA1C: No results for input(s): HGBA1C in the last 72 hours. CBG: No results for input(s): GLUCAP in the last 168 hours. Lipid Profile: No results for input(s): CHOL, HDL, LDLCALC, TRIG, CHOLHDL, LDLDIRECT in the last 72 hours. Thyroid Function Tests: No results for input(s): TSH, T4TOTAL, FREET4, T3FREE, THYROIDAB in the last 72 hours. Anemia Panel: No  results for input(s): VITAMINB12, FOLATE, FERRITIN, TIBC, IRON, RETICCTPCT in the last 72 hours. Urine analysis:    Component Value Date/Time   COLORURINE YELLOW 12/12/2019 0057   APPEARANCEUR CLEAR 12/12/2019 0057   LABSPEC 1.010 12/12/2019 0057   PHURINE 7.5 12/12/2019 0057   GLUCOSEU 250 (A) 12/12/2019 0057   HGBUR SMALL (A) 12/12/2019 0057   BILIRUBINUR NEGATIVE 12/12/2019 0057   KETONESUR 15 (A) 12/12/2019 0057   PROTEINUR 30 (A) 12/12/2019 0057   NITRITE NEGATIVE 12/12/2019 0057   LEUKOCYTESUR NEGATIVE 12/12/2019 0057    Radiological Exams on Admission: DG Chest 2 View  Result Date: 12/11/2019 CLINICAL DATA:  Sepsis, nausea/vomiting/diarrhea, fever EXAM: CHEST - 2 VIEW COMPARISON:  09/08/2019 FINDINGS: Frontal and lateral views of the chest demonstrate a stable cardiac silhouette. No airspace disease, effusion, or pneumothorax. Postsurgical changes from right nephrectomy. No acute bony abnormalities. IMPRESSION: 1. No acute intrathoracic process. Electronically Signed   By: Randa Ngo M.D.   On: 12/11/2019 22:26   CT Abdomen Pelvis W Contrast  Result Date: 12/11/2019 CLINICAL DATA:  Nausea, vomiting, diarrhea for 1 day. History of right renal cell carcinoma status post nephrectomy EXAM:  CT ABDOMEN AND PELVIS WITH CONTRAST TECHNIQUE: Multidetector CT imaging of the abdomen and pelvis was performed using the standard protocol following bolus administration of intravenous contrast. CONTRAST:  5mL OMNIPAQUE IOHEXOL 300 MG/ML  SOLN COMPARISON:  11/03/2019 FINDINGS: Lower chest: Visualized portions of the lung bases are clear. Hepatobiliary: No focal liver abnormality is seen. No gallstones, gallbladder wall thickening, or biliary dilatation. Pancreas: Unremarkable. No pancreatic ductal dilatation or surrounding inflammatory changes. Spleen: Normal in size without focal abnormality. Adrenals/Urinary Tract: Postsurgical changes from right nephrectomy. Compensatory hypertrophy of the left kidney. Stable left renal cyst. No obstructive uropathy. The bladder is unremarkable. The adrenals are normal. Stomach/Bowel: No bowel obstruction or ileus. Minimal diverticulosis of the descending colon without diverticulitis. The appendix is surgically absent. No bowel wall thickening or inflammatory change. There is a small hiatal hernia. Vascular/Lymphatic: Aortic atherosclerosis. No enlarged abdominal or pelvic lymph nodes. Reproductive: Status post hysterectomy. No adnexal masses. Other: No abdominal wall hernia or abnormality. No abdominopelvic ascites. Musculoskeletal: No acute or destructive bony lesions. Reconstructed images demonstrate no additional findings. IMPRESSION: 1. No acute intra-abdominal or intrapelvic process. 2. Stable postsurgical changes from right nephrectomy. 3. Stable hiatal hernia. 4.  Aortic Atherosclerosis (ICD10-I70.0). Electronically Signed   By: Randa Ngo M.D.   On: 12/11/2019 23:17    EKG: Independently reviewed.  Assessment/Plan Principal Problem:   SIRS (systemic inflammatory response syndrome) (HCC) Active Problems:   Abdominal pain   Type 2 diabetes mellitus (HCC)   Benign essential HTN   Nausea vomiting and diarrhea    1. SIRS - Low grade fever and WBC, unk  source 1. Neg work up for source thus far includes: CT abd/pelvis, CXR, UA 2. Has N/V/D 3. Repeat lactate now 4. Repeat CBC/BMP now 5. Cont empiric cefepime / flagyl for now 1. MRSA PCR nares was neg 2. Will go ahead and stop Vanc 6. BCx pending 7. IVF: 500cc bolus in ED and NS at 75 for the moment, may need to adjust depending on lactate results, but pt very hypertensive at the moment. 2. N/V/D, abd pain - 1. Zofran PRN 2. Cont home MS contin 3. Hold home norco 4. Use PRN dilaudid if needed 5. Again, unremarkable CT for acute causes 3. DM2 - 1. Cont home Lantus 5u QHS 2. Sensitive SSI Q4H 4. HTN - 1. Cont  home BP meds 2. But will hold aldactone  DVT prophylaxis: Lovenox Code Status: Full Family Communication: No family in room Disposition Plan: Home after taking POs, and SIRS improved Consults called: None Admission status: Place in 109   Treana Lacour, [place] [facility]  How to contact the [facility] Attending or Consulting provider [place] or covering provider during after hours [place], for this patient?  1. Check the care team in [facility] and look for a) attending/consulting [facility] provider listed and b) the [facility] team listed 2. Log into [email or web address]  Amion Physician Scheduling and messaging for groups and whole hospitals  On call and physician scheduling software for group practices, residents, hospitalists and other medical providers for call, clinic, rotation and shift schedules. On[employer] is a hospital-wide system for scheduling doctors and paging doctors on call. EasyPlot is for scientific plotting and data analysis.  [email or web address]  and use [place]'s universal password to access. If you do not have the password, please contact the hospital operator.  3. Locate the [facility] provider you are looking for under [facility] and page to a number that you can be directly reached. 4. If you still have difficulty reaching the provider, please page the [facility] (Director on  Call) for the Hospitalists listed on amion for assistance.  12/12/2019, 5:29 AM

## 2019-12-12 NOTE — Progress Notes (Addendum)
PROGRESS NOTE    Amy Nunez  [id number] DOB: Jan 02, 1961 DOA: 12/11/2019 PCP: Einar Pheasant, DO    Brief Narrative:  Patient was admitted to the hospital with a working diagnosis of intractable nausea and vomiting to rule out systemic infection.  59 year old female who presented with nausea and vomiting.  She does have significant past medical history for type 2 diabetes mellitus, hypertension, and renal cancer status post nephrectomy.  Reported 24 hours of persistent nausea, vomiting, diarrhea, associated with generalized abdominal pain and fever.  On her initial physical examination her blood pressure was 156/96, pulse rate 99, respiratory rate 27, temperature 98.6, oxygen saturation 93%.  She had moist mucous membranes, her lungs were clear to auscultation bilaterally, heart S1-S2 present, rhythmic, abdomen was soft, generalized tender to palpation, no lower extremity edema. Sodium 131, potassium 4.6, chloride 97, bicarb 18, glucose 337, BUN 22, creatinine 1.25, lactic acid 2.7, white count 12.4, hemoglobin 12.7, hematocrit 35.6, platelets 402.  SARS COVID-19 negative.  Urine analysis specific gravity 1.010, 0-5 white cells, 0-5 red cells.  CT of the abdomen no acute changes.  Chest radiograph with right base atelectasis.  EKG 98 bpm, left axis deviation, left anterior fascicular block, sinus rhythm, no ST segment or T wave changes.   This am patient continue to have persistent abdominal pain, with nausea and vomiting. Not tolerating po well and now her blood pressure is elevated.   Assessment & Plan:   Principal Problem:   SIRS (systemic inflammatory response syndrome) (HCC) Active Problems:   Abdominal pain   Type 2 diabetes mellitus (HCC)   Benign essential HTN   Nausea vomiting and diarrhea   1. Intractable nausea and vomiting, possible gastritis/ complicated with leukocytosis. Patient continue with no toleration of PO, persistent nausea and vomiting. Blood pressure this  am 178/109 mmHg. Worsening leukocytosis up to 24 from 12. Blood cultures with no growth. Lactic acid down to 2,1.   Patient has failed outpatient therapy, will continue IV fluids with isotonic saline at 75 ml per H, add pantoprazole IV bid and change ondansetron to phenergan. Continue with clears liquids as tolerated. Continue telemetry monitoring. No source of infection, will hold on antibiotic therapy for now. Check C diff and stool panel. Continue pain control with IV morphine.   Patient in high risk of worsening condition.   2. Uncontrolled T2DM, with Hgb A1c 7.7/ dyslipidemia. Will continue glucose cover and monitoring with insulin sliding scale, clear liquids as tolerated. Fasting glucose 278 mg/ dl. Continue basal insulin with 5 units glargine.   3. Uncontrolled HTN. Resume po antihypertensive and close monitoring on telemetry. Lisinopril 20 mg daily and amlodipine 5 mg daily. As needed hydralazine IV.   4. CKD stage 3a/ hyponatremia/ anion gap metabolic acidosis. Renal function with serum cr at 1,21, K at 4,3 and serum Na at 134 with serum bicarbonate at 18, a gap 14.   Will continue saline at 75 ml per H, will follow on renal function in am, avoid hypotension and nephrotoxic medications.   5. Chronic pain syndrome. Patient on morphine 30 mg bid. Add IV morphine for break through. Continue with quetiapine.   DVT prophylaxis: enoxaparin   Code Status:  full Family Communication: no family at the bedside  Disposition Plan/ discharge barriers: patient from home barrier for dc failed outpatient therapy with IV fluids, IV antiemetics and IV analgesics, patient will need further inpatient care for more than one midnight, will change to inpatient.    Subjective: Patient is  not feeling well, continue to have nausea and vomiting, associated with abdominal pian, no dyspnea or chest pain, Not tolerating po intake.   Objective: Vitals:   12/12/19 0530 12/12/19 0600 12/12/19 0700 12/12/19 0800    BP: (!) 195/113 (!) 187/113 (!) 178/109   Pulse: 98 (!) 102 81   Resp: (!) 24 19 (!) 23   Temp:    98.2 F (36.8 C)  TempSrc:    Oral  SpO2: 97% 98% 94%   Weight:      Height:        Intake/Output Summary (Last 24 hours) at 12/12/2019 0901 Last data filed at 12/12/2019 0222 Gross per 24 hour  Intake 1392.09 ml  Output 250 ml  Net 1142.09 ml   Filed Weights   12/11/19 2124 12/12/19 0411  Weight: 90.7 kg 87.2 kg    Examination:   General: Not in pain or dyspnea, deconditioned and in pain Neurology: Awake and alert, non focal  E ENT: mild pallor, no icterus, oral mucosa dry Cardiovascular: No JVD. S1-S2 present, rhythmic, no gallops, rubs, or murmurs. No lower extremity edema. Pulmonary: positive breath sounds bilaterally, adequate air movement, no wheezing, rhonchi or rales. Gastrointestinal. Abdomen mild distended, tender to palpation with no organomegaly, no rebound or guarding Skin. No rashes Musculoskeletal: no joint deformities     Data Reviewed: I have personally reviewed following labs and imaging studies  CBC: Recent Labs  Lab 12/11/19 2202 12/12/19 0552  WBC 12.4* 24.4*  NEUTROABS 11.0*  --   HGB 12.7 13.4  HCT 35.6* 39.7  MCV 91.0 94.5  PLT 402* [id number]   Basic Metabolic Panel: Recent Labs  Lab 12/11/19 2157 12/12/19 0552  NA 131* 134*  K 4.6 4.2  CL 97* 102  CO2 18* 18*  GLUCOSE 337* 278*  BUN 22* 20  CREATININE 1.25* 1.21*  CALCIUM 9.9 9.4   GFR: Estimated Creatinine Clearance: 54.5 mL/min (A) (by C-G formula based on SCr of 1.21 mg/dL (H)). Liver Function Tests: Recent Labs  Lab 12/11/19 2157  AST 27  ALT 24  ALKPHOS 123  BILITOT 0.7  PROT 8.6*  ALBUMIN 4.7   Recent Labs  Lab 12/11/19 2157  LIPASE 37   No results for input(s): AMMONIA in the last 168 hours. Coagulation Profile: Recent Labs  Lab 12/11/19 2202  INR 1.0   Cardiac Enzymes: No results for input(s): CKTOTAL, CKMB, CKMBINDEX, TROPONINI in the last 168  hours. BNP (last 3 results) No results for input(s): PROBNP in the last 8760 hours. HbA1C: Recent Labs    12/12/19 0552  HGBA1C 7.7*   CBG: Recent Labs  Lab 12/12/19 0531 12/12/19 0716  GLUCAP 267* 247*   Lipid Profile: No results for input(s): CHOL, HDL, LDLCALC, TRIG, CHOLHDL, LDLDIRECT in the last 72 hours. Thyroid Function Tests: No results for input(s): TSH, T4TOTAL, FREET4, T3FREE, THYROIDAB in the last 72 hours. Anemia Panel: No results for input(s): VITAMINB12, FOLATE, FERRITIN, TIBC, IRON, RETICCTPCT in the last 72 hours.    Radiology Studies: I have reviewed all of the imaging during this hospital visit personally     Scheduled Meds: . amLODipine  5 mg Oral Daily  . atorvastatin  80 mg Oral q1800  . Chlorhexidine Gluconate Cloth  6 each Topical Daily  . enoxaparin (LOVENOX) injection  40 mg Subcutaneous Daily  . insulin aspart  0-9 Units Subcutaneous Q4H  . insulin glargine  5 Units Subcutaneous QHS  . lisinopril  20 mg Oral Daily  . morphine  30 mg Oral Q12H  . QUEtiapine  200-400 mg Oral See admin instructions   Continuous Infusions: . sodium chloride 75 mL/hr at 12/12/19 0543  . ceFEPime (MAXIPIME) IV Stopped (12/12/19 0115)  . metronidazole 500 mg (12/12/19 0830)     LOS: 0 days        Kala Gassmann Gerome Apley, MD

## 2019-12-12 NOTE — ED Notes (Signed)
Unable to obtain 2nd set of cultures before antibiotics started. Pt difficult stick.

## 2019-12-12 NOTE — Plan of Care (Signed)
  Problem: Fluid Volume: Goal: Hemodynamic stability will improve Outcome: Progressing   Problem: Clinical Measurements: Goal: Diagnostic test results will improve Outcome: Progressing   Problem: Respiratory: Goal: Ability to maintain adequate ventilation will improve Outcome: Progressing   Problem: Education: Goal: Knowledge of General Education information will improve Description: Including pain rating scale, medication(s)/side effects and non-pharmacologic comfort measures Outcome: Progressing   Problem: Health Behavior/Discharge Planning: Goal: Ability to manage health-related needs will improve Outcome: Progressing   Problem: Clinical Measurements: Goal: Ability to maintain clinical measurements within normal limits will improve Outcome: Progressing Goal: Will remain free from infection Outcome: Progressing Goal: Diagnostic test results will improve Outcome: Progressing Goal: Respiratory complications will improve Outcome: Progressing Goal: Cardiovascular complication will be avoided Outcome: Progressing   Problem: Activity: Goal: Risk for activity intolerance will decrease Outcome: Progressing   Problem: Nutrition: Goal: Adequate nutrition will be maintained Outcome: Progressing   Problem: Coping: Goal: Level of anxiety will decrease Outcome: Progressing   Problem: Elimination: Goal: Will not experience complications related to bowel motility Outcome: Progressing Goal: Will not experience complications related to urinary retention Outcome: Progressing   Problem: Pain Managment: Goal: General experience of comfort will improve Outcome: Progressing   Problem: Safety: Goal: Ability to remain free from injury will improve Outcome: Progressing   Problem: Skin Integrity: Goal: Risk for impaired skin integrity will decrease Outcome: Progressing

## 2019-12-13 LAB — CBC WITH DIFFERENTIAL/PLATELET
Abs Immature Granulocytes: 0.05 10*3/uL (ref 0.00–0.07)
Basophils Absolute: 0 10*3/uL (ref 0.0–0.1)
Basophils Relative: 0 %
Eosinophils Absolute: 0.1 10*3/uL (ref 0.0–0.5)
Eosinophils Relative: 0 %
HCT: 34.3 % — ABNORMAL LOW (ref 36.0–46.0)
Hemoglobin: 11.2 g/dL — ABNORMAL LOW (ref 12.0–15.0)
Immature Granulocytes: 0 %
Lymphocytes Relative: 16 %
Lymphs Abs: 2.4 10*3/uL (ref 0.7–4.0)
MCH: 31.6 pg (ref 26.0–34.0)
MCHC: 32.7 g/dL (ref 30.0–36.0)
MCV: 96.9 fL (ref 80.0–100.0)
Monocytes Absolute: 0.9 10*3/uL (ref 0.1–1.0)
Monocytes Relative: 6 %
Neutro Abs: 12 10*3/uL — ABNORMAL HIGH (ref 1.7–7.7)
Neutrophils Relative %: 78 %
Platelets: 292 10*3/uL (ref 150–400)
RBC: 3.54 MIL/uL — ABNORMAL LOW (ref 3.87–5.11)
RDW: 14.1 % (ref 11.5–15.5)
WBC: 15.5 10*3/uL — ABNORMAL HIGH (ref 4.0–10.5)
nRBC: 0 % (ref 0.0–0.2)

## 2019-12-13 LAB — GLUCOSE, CAPILLARY
Glucose-Capillary: 118 mg/dL — ABNORMAL HIGH (ref 70–99)
Glucose-Capillary: 122 mg/dL — ABNORMAL HIGH (ref 70–99)
Glucose-Capillary: 141 mg/dL — ABNORMAL HIGH (ref 70–99)
Glucose-Capillary: 147 mg/dL — ABNORMAL HIGH (ref 70–99)
Glucose-Capillary: 149 mg/dL — ABNORMAL HIGH (ref 70–99)
Glucose-Capillary: 167 mg/dL — ABNORMAL HIGH (ref 70–99)

## 2019-12-13 LAB — BASIC METABOLIC PANEL
Anion gap: 12 (ref 5–15)
BUN: 20 mg/dL (ref 6–20)
CO2: 18 mmol/L — ABNORMAL LOW (ref 22–32)
Calcium: 8.1 mg/dL — ABNORMAL LOW (ref 8.9–10.3)
Chloride: 106 mmol/L (ref 98–111)
Creatinine, Ser: 1.52 mg/dL — ABNORMAL HIGH (ref 0.44–1.00)
GFR calc Af Amer: 43 mL/min — ABNORMAL LOW (ref >60)
GFR calc non Af Amer: 37 mL/min — ABNORMAL LOW (ref >60)
Glucose, Bld: 152 mg/dL — ABNORMAL HIGH (ref 70–99)
Potassium: 3.7 mmol/L (ref 3.5–5.1)
Sodium: 136 mmol/L (ref 135–145)

## 2019-12-13 MED ORDER — VANCOMYCIN HCL 750 MG/150ML IV SOLN
750.0000 mg | INTRAVENOUS | Status: DC
  Administered 2019-12-13 – 2019-12-14 (×2): 750 mg via INTRAVENOUS
  Filled 2019-12-13 (×3): qty 150

## 2019-12-13 MED ORDER — METRONIDAZOLE IN NACL 5-0.79 MG/ML-% IV SOLN
500.0000 mg | Freq: Three times a day (TID) | INTRAVENOUS | Status: DC
  Administered 2019-12-13 – 2019-12-15 (×6): 500 mg via INTRAVENOUS
  Filled 2019-12-13 (×6): qty 100

## 2019-12-13 MED ORDER — SODIUM CHLORIDE 0.9 % IV SOLN
2.0000 g | Freq: Two times a day (BID) | INTRAVENOUS | Status: DC
  Administered 2019-12-13 – 2019-12-15 (×3): 2 g via INTRAVENOUS
  Filled 2019-12-13 (×5): qty 2

## 2019-12-13 NOTE — Progress Notes (Signed)
Pharmacy Antibiotic Note  Amy Nunez is a 59 y.o. female admitted on 12/11/2019 with sepsis.  Pharmacy has been consulted for vancomycin and cefepime dosing. Patient received vancomycin 1000 mg iv x 1  4/16 @ 0114 and cefepime 2 g x 1 @ 0017 then antibiotics were stopped. MD resuming empiric antibiotics as patient is clinically tenuous with soft BP.   Plan: Cefepime 2 g iv q 12 hours.   Vancomycin 750 mg IV Q 24 hrs. Goal AUC 400-550. Expected AUC: 500 SCr used: 1.52 - F/U renal funciton and culture results   Height: 5' 5.5" (166.4 cm) Weight: 87.2 kg (192 lb 3.9 oz) IBW/kg (Calculated) : 58.15  Temp (24hrs), Avg:99 F (37.2 C), Min:98.6 F (37 C), Max:99.3 F (37.4 C)  Recent Labs  Lab 12/11/19 2157 12/11/19 2202 12/12/19 0003 12/12/19 0552 12/12/19 0825 12/13/19 0617  WBC  --  12.4*  --  24.4*  --  15.5*  CREATININE 1.25*  --   --  1.21*  --  1.52*  LATICACIDVEN  --  2.7* 3.1* 2.0* 2.1*  --     Estimated Creatinine Clearance: 43.4 mL/min (A) (by C-G formula based on SCr of 1.52 mg/dL (H)).    Allergies  Allergen Reactions  . Penicillins Anaphylaxis and Rash    Other reaction(s): Unknown  . Ciprofloxacin Hives  . Ibuprofen Nausea And Vomiting  . Red Dye Diarrhea    Other reaction(s): Unknown    Thank you for allowing pharmacy to be a part of this patient's care.  Napoleon Form 12/13/2019 2:41 PM

## 2019-12-13 NOTE — Progress Notes (Signed)
PROGRESS NOTE    Amy Nunez  [id number] DOB: [birth year] DOA: 12/11/2019 PCP: Einar Pheasant, DO   Brief Narrative:  Per admitting MD: Amy Nunez is a 59 y.o. female with medical history significant of DM2, HTN.  Renal cancer s/p R nephrectomy in Feb, also s/p appendectomy.who presented to ED at [facility] with c/o N/V/D onset yesterday.  Associated generalized abd pain.  Vomiting/NBNB which persisted initially despite zofran.  Report of fever. .   ED Course: Tm 99.7, Lactates were 2.7 and up to 3.1 on repeat in ED.  Given 500cc NS bolus and put on 75 cc/hr overnight.  Put on empiric cefepime, vanc, flagyl.  CXR neg, UA neg, CT abd/pelvis neg for anything acute (just expected post op changes from nephrectomy).   Assessment & Plan:   Principal Problem:   SIRS (systemic inflammatory response syndrome) (HCC) Active Problems:   Abdominal pain   Type 2 diabetes mellitus (HCC)   Benign essential HTN   Nausea vomiting and diarrhea   Intractable nausea and vomiting   1. SIRS - Low grade fever and WBC, unk source 1. Initially neg work up for source thus far includes: CT abd/pelvis, CXR, UA  2. Has persistent N/V/D 3. LA 2.7>3.1>2>2.1 4. Repeat CBC/BMP in AM Initial abx not started will add back in Vanc/cef/flagy; 5. Repeat bcx pending 6. IVF: 500cc bolus in ED, BP now soft, increase NS to 125t. 2. N/V/D, abd pain - 1. Zofran PRN 2. Cont home MS contin as bp allows, ?withdrawal 3. Holding home norco 4. Use PRN dilaudid if needed 5. Again, unremarkable CT for acute causes 3. DM2 - 1. Cont home Lantus 5u QHS 2. Sensitive SSI Q4H 4. HTN - 1. Cont home BP meds if bp toelrates, add parameters 2. But will hold aldactone   DVT prophylaxis: Lovenox SQ  Code Status: full    Code Status Orders  (From admission, onward)         Start     Ordered   12/12/19 0531  Full code  Continuous     12/12/19 0531        Code Status History    Date Active Date  Inactive Code Status Order ID Comments User Context   11/03/2019 2256 11/04/2019 1635 Full Code [id number]  Leighton Ruff, MD Inpatient   09/19/2019 0258 09/20/2019 1832 Full Code [id number]  Phillips Grout, MD Inpatient   09/19/2019 0112 09/19/2019 0258 Full Code [id number]  Bonnell Public, MD Inpatient   09/03/2019 0003 09/04/2019 2133 Full Code [id number]  Elwyn Reach, MD Inpatient   Advance Care Planning Activity    Advance Directive Documentation     Most Recent Value  Type of Advance Directive  Healthcare Power of Attorney, Living will  Pre-existing out of facility DNR order (yellow form or pink MOST form)  --  "MOST" Form in Place?  --     Family Communication: tried calling daughter n/a [phone] Disposition Plan:   Barrier to d/c, intractable n/v requiring iv anti-emetics Consults called: None Admission status: Inpatient   Consultants:   None  Procedures:  DG Chest 2 View  Result Date: 12/11/2019 CLINICAL DATA:  Sepsis, nausea/vomiting/diarrhea, fever EXAM: CHEST - 2 VIEW COMPARISON:  09/08/2019 FINDINGS: Frontal and lateral views of the chest demonstrate a stable cardiac silhouette. No airspace disease, effusion, or pneumothorax. Postsurgical changes from right nephrectomy. No acute bony abnormalities. IMPRESSION: 1. No acute intrathoracic process. Electronically Signed   By: Randa Ngo  M.D.   On: 12/11/2019 22:26   CT Abdomen Pelvis W Contrast  Result Date: 12/11/2019 CLINICAL DATA:  Nausea, vomiting, diarrhea for 1 day. History of right renal cell carcinoma status post nephrectomy EXAM: CT ABDOMEN AND PELVIS WITH CONTRAST TECHNIQUE: Multidetector CT imaging of the abdomen and pelvis was performed using the standard protocol following bolus administration of intravenous contrast. CONTRAST:  14mL OMNIPAQUE IOHEXOL 300 MG/ML  SOLN COMPARISON:  11/03/2019 FINDINGS: Lower chest: Visualized portions of the lung bases are clear. Hepatobiliary: No focal liver abnormality is  seen. No gallstones, gallbladder wall thickening, or biliary dilatation. Pancreas: Unremarkable. No pancreatic ductal dilatation or surrounding inflammatory changes. Spleen: Normal in size without focal abnormality. Adrenals/Urinary Tract: Postsurgical changes from right nephrectomy. Compensatory hypertrophy of the left kidney. Stable left renal cyst. No obstructive uropathy. The bladder is unremarkable. The adrenals are normal. Stomach/Bowel: No bowel obstruction or ileus. Minimal diverticulosis of the descending colon without diverticulitis. The appendix is surgically absent. No bowel wall thickening or inflammatory change. There is a small hiatal hernia. Vascular/Lymphatic: Aortic atherosclerosis. No enlarged abdominal or pelvic lymph nodes. Reproductive: Status post hysterectomy. No adnexal masses. Other: No abdominal wall hernia or abnormality. No abdominopelvic ascites. Musculoskeletal: No acute or destructive bony lesions. Reconstructed images demonstrate no additional findings. IMPRESSION: 1. No acute intra-abdominal or intrapelvic process. 2. Stable postsurgical changes from right nephrectomy. 3. Stable hiatal hernia. 4.  Aortic Atherosclerosis (ICD10-I70.0). Electronically Signed   By: Randa Ngo M.D.   On: 12/11/2019 23:17     Antimicrobials:   Vanc/cefepime/flagyl 4/17    Subjective:Still reports feeling nauseas with vomitting  Objective: Vitals:   12/12/19 2022 12/13/19 0005 12/13/19 0417 12/13/19 0834  BP: (!) 139/95 101/65 (!) 101/50 107/76  Pulse: 96 (!) 110 87 89  Resp: 20 20 19 18   Temp: 99.2 F (37.3 C) 99.2 F (37.3 C) 99.3 F (37.4 C) 98.6 F (37 C)  TempSrc: Oral Oral Oral Oral  SpO2: 100% 99% 100% 97%  Weight:      Height:       No intake or output data in the 24 hours ending 12/13/19 1427 Filed Weights   12/11/19 2124 12/12/19 0411  Weight: 90.7 kg 87.2 kg    Examination:  General exam: Not feeling well, n/v Respiratory system: Clear to auscultation.  Respiratory effort normal. Cardiovascular system: S1 & S2 heard, RRR. No JVD, murmurs, rubs, gallops or clicks. No pedal edema. Gastrointestinal system: Abdomen is mildly ttp c/w n/v muscle pain, not rigid, pos bs, not distended Central nervous system: Alert and oriented. No focal neurological deficits. Extremities: wwp, no contractures Skin: No rashes, lesions or ulcers Psychiatry: Judgement and insight appear normal. Mood & affect appropriate.     Data Reviewed: I have personally reviewed following labs and imaging studies  CBC: Recent Labs  Lab 12/11/19 2202 12/12/19 0552 12/13/19 0617  WBC 12.4* 24.4* 15.5*  NEUTROABS 11.0*  --  12.0*  HGB 12.7 13.4 11.2*  HCT 35.6* 39.7 34.3*  MCV 91.0 94.5 96.9  PLT 402* 355 [id number]   Basic Metabolic Panel: Recent Labs  Lab 12/11/19 2157 12/12/19 0552 12/13/19 0617  NA 131* 134* 136  K 4.6 4.2 3.7  CL 97* 102 106  CO2 18* 18* 18*  GLUCOSE 337* 278* 152*  BUN 22* 20 20  CREATININE 1.25* 1.21* 1.52*  CALCIUM 9.9 9.4 8.1*   GFR: Estimated Creatinine Clearance: 43.4 mL/min (A) (by C-G formula based on SCr of 1.52 mg/dL (H)). Liver Function Tests: Recent Labs  Lab 12/11/19 2157  AST 27  ALT 24  ALKPHOS 123  BILITOT 0.7  PROT 8.6*  ALBUMIN 4.7   Recent Labs  Lab 12/11/19 2157  LIPASE 37   No results for input(s): AMMONIA in the last 168 hours. Coagulation Profile: Recent Labs  Lab 12/11/19 2202  INR 1.0   Cardiac Enzymes: No results for input(s): CKTOTAL, CKMB, CKMBINDEX, TROPONINI in the last 168 hours. BNP (last 3 results) No results for input(s): PROBNP in the last 8760 hours. HbA1C: Recent Labs    12/12/19 0552  HGBA1C 7.7*   CBG: Recent Labs  Lab 12/12/19 2025 12/13/19 0001 12/13/19 0419 12/13/19 0754 12/13/19 1202  GLUCAP 182* 147* 141* 149* 167*   Lipid Profile: No results for input(s): CHOL, HDL, LDLCALC, TRIG, CHOLHDL, LDLDIRECT in the last 72 hours. Thyroid Function Tests: No results for  input(s): TSH, T4TOTAL, FREET4, T3FREE, THYROIDAB in the last 72 hours. Anemia Panel: No results for input(s): VITAMINB12, FOLATE, FERRITIN, TIBC, IRON, RETICCTPCT in the last 72 hours. Sepsis Labs: Recent Labs  Lab 12/11/19 2202 12/12/19 0003 12/12/19 0552 12/12/19 0825  LATICACIDVEN 2.7* 3.1* 2.0* 2.1*    Recent Results (from the past 240 hour(s))  Culture, blood (Routine x 2)     Status: None (Preliminary result)   Collection Time: 12/11/19  9:55 PM   Specimen: BLOOD  Result Value Ref Range Status   Specimen Description   Final    BLOOD RIGHT ANTECUBITAL Performed at [facility], [address] [place], [place] [address]    Special Requests   Final    BOTTLES DRAWN AEROBIC AND ANAEROBIC Blood Culture adequate volume Performed at [facility], [address] [place], [place] [address]    Culture   Final    NO GROWTH < 24 HOURS Performed at [facility], [place] [address] [place], [place] [address]    Report Status PENDING  Incomplete  Respiratory Panel by RT PCR (Flu A&B, Covid) - Nasopharyngeal Swab     Status: None   Collection Time: 12/12/19 12:06 AM   Specimen: Nasopharyngeal Swab  Result Value Ref Range Status   SARS Coronavirus 2 by RT PCR NEGATIVE NEGATIVE Final    Comment: (NOTE) SARS-CoV-2 target nucleic acids are NOT DETECTED. The SARS-CoV-2 RNA is generally detectable in upper respiratoy specimens during the acute phase of infection. The lowest concentration of SARS-CoV-2 viral copies this assay can detect is 131 copies/mL. A negative result does not preclude SARS-Cov-2 infection and should not be used as the sole basis for treatment or other patient management decisions. A negative result may occur with  improper specimen collection/handling, submission of specimen other than nasopharyngeal swab, presence of viral mutation(s) within the areas targeted by this assay, and inadequate number of viral copies (<131 copies/mL). A  negative result must be combined with clinical observations, patient history, and epidemiological information. The expected result is Negative. Fact Sheet for Patients:  PinkCheek.be Fact Sheet for Healthcare Providers:  GravelBags.it This test is not yet ap proved or cleared by the [place] FDA and  has been authorized for detection and/or diagnosis of SARS-CoV-2 by FDA under an Emergency Use Authorization (EUA). This EUA will remain  in effect (meaning this test can be used) for the duration of the COVID-19 declaration under Section 564(b)(1) of the Act, 21 U.S.C. section 360bbb-3(b)(1), unless the authorization is terminated or revoked sooner.    Influenza A by PCR NEGATIVE NEGATIVE Final   Influenza  B by PCR NEGATIVE NEGATIVE Final    Comment: (NOTE) The Xpert Xpress SARS-CoV-2/FLU/RSV assay is intended as an aid in  the diagnosis of influenza from Nasopharyngeal swab specimens and  should not be used as a sole basis for treatment. Nasal washings and  aspirates are unacceptable for Xpert Xpress SARS-CoV-2/FLU/RSV  testing. Fact Sheet for Patients: PinkCheek.be Fact Sheet for Healthcare Providers: GravelBags.it This test is not yet approved or cleared by the [place] FDA and  has been authorized for detection and/or diagnosis of SARS-CoV-2 by  FDA under an Emergency Use Authorization (EUA). This EUA will remain  in effect (meaning this test can be used) for the duration of the  Covid-19 declaration under Section 564(b)(1) of the Act, 21  U.S.C. section 360bbb-3(b)(1), unless the authorization is  terminated or revoked. Performed at [facility], [address] [place], [place] [address]   MRSA PCR Screening     Status: None   Collection Time: 12/12/19  3:33 AM   Specimen: Nasopharyngeal  Result Value Ref Range Status   MRSA by PCR NEGATIVE  NEGATIVE Final    Comment:        The GeneXpert MRSA Assay (FDA approved for NASAL specimens only), is one component of a comprehensive MRSA colonization surveillance program. It is not intended to diagnose MRSA infection nor to guide or monitor treatment for MRSA infections. Performed at [facility], [place] [address], [place], [place] [address]          Radiology Studies: DG Chest 2 View  Result Date: 12/11/2019 CLINICAL DATA:  Sepsis, nausea/vomiting/diarrhea, fever EXAM: CHEST - 2 VIEW COMPARISON:  09/08/2019 FINDINGS: Frontal and lateral views of the chest demonstrate a stable cardiac silhouette. No airspace disease, effusion, or pneumothorax. Postsurgical changes from right nephrectomy. No acute bony abnormalities. IMPRESSION: 1. No acute intrathoracic process. Electronically Signed   By: Randa Ngo M.D.   On: 12/11/2019 22:26   CT Abdomen Pelvis W Contrast  Result Date: 12/11/2019 CLINICAL DATA:  Nausea, vomiting, diarrhea for 1 day. History of right renal cell carcinoma status post nephrectomy EXAM: CT ABDOMEN AND PELVIS WITH CONTRAST TECHNIQUE: Multidetector CT imaging of the abdomen and pelvis was performed using the standard protocol following bolus administration of intravenous contrast. CONTRAST:  59mL OMNIPAQUE IOHEXOL 300 MG/ML  SOLN COMPARISON:  11/03/2019 FINDINGS: Lower chest: Visualized portions of the lung bases are clear. Hepatobiliary: No focal liver abnormality is seen. No gallstones, gallbladder wall thickening, or biliary dilatation. Pancreas: Unremarkable. No pancreatic ductal dilatation or surrounding inflammatory changes. Spleen: Normal in size without focal abnormality. Adrenals/Urinary Tract: Postsurgical changes from right nephrectomy. Compensatory hypertrophy of the left kidney. Stable left renal cyst. No obstructive uropathy. The bladder is unremarkable. The adrenals are normal. Stomach/Bowel: No bowel obstruction or ileus. Minimal  diverticulosis of the descending colon without diverticulitis. The appendix is surgically absent. No bowel wall thickening or inflammatory change. There is a small hiatal hernia. Vascular/Lymphatic: Aortic atherosclerosis. No enlarged abdominal or pelvic lymph nodes. Reproductive: Status post hysterectomy. No adnexal masses. Other: No abdominal wall hernia or abnormality. No abdominopelvic ascites. Musculoskeletal: No acute or destructive bony lesions. Reconstructed images demonstrate no additional findings. IMPRESSION: 1. No acute intra-abdominal or intrapelvic process. 2. Stable postsurgical changes from right nephrectomy. 3. Stable hiatal hernia. 4.  Aortic Atherosclerosis (ICD10-I70.0). Electronically Signed   By: Randa Ngo M.D.   On: 12/11/2019 23:17        Scheduled Meds: . amLODipine  10 mg Oral  Daily  . atorvastatin  80 mg Oral q1800  . Chlorhexidine Gluconate Cloth  6 each Topical Daily  . enoxaparin (LOVENOX) injection  40 mg Subcutaneous Daily  . hydrALAZINE  50 mg Oral Q8H  . insulin aspart  0-9 Units Subcutaneous Q4H  . insulin glargine  5 Units Subcutaneous QHS  . lisinopril  20 mg Oral Daily  . morphine  30 mg Oral Q12H  . pantoprazole (PROTONIX) IV  40 mg Intravenous Q12H  . QUEtiapine  200 mg Oral Daily  . QUEtiapine  400 mg Oral QHS   Continuous Infusions: . sodium chloride 75 mL/hr at 12/12/19 1231     LOS: 1 day    Time spent: 35 min    Nicolette Bang, MD [facility]  If 7PM-7AM, please contact night-coverage  12/13/2019, 2:27 PM

## 2019-12-14 ENCOUNTER — Inpatient Hospital Stay (HOSPITAL_COMMUNITY): Payer: Medicare HMO

## 2019-12-14 LAB — CBC WITH DIFFERENTIAL/PLATELET
Abs Immature Granulocytes: 0.05 10*3/uL (ref 0.00–0.07)
Basophils Absolute: 0 10*3/uL (ref 0.0–0.1)
Basophils Relative: 0 %
Eosinophils Absolute: 0.2 10*3/uL (ref 0.0–0.5)
Eosinophils Relative: 2 %
HCT: 31.5 % — ABNORMAL LOW (ref 36.0–46.0)
Hemoglobin: 10.7 g/dL — ABNORMAL LOW (ref 12.0–15.0)
Immature Granulocytes: 1 %
Lymphocytes Relative: 26 %
Lymphs Abs: 2.7 10*3/uL (ref 0.7–4.0)
MCH: 31.9 pg (ref 26.0–34.0)
MCHC: 34 g/dL (ref 30.0–36.0)
MCV: 94 fL (ref 80.0–100.0)
Monocytes Absolute: 0.8 10*3/uL (ref 0.1–1.0)
Monocytes Relative: 8 %
Neutro Abs: 6.6 10*3/uL (ref 1.7–7.7)
Neutrophils Relative %: 63 %
Platelets: 251 10*3/uL (ref 150–400)
RBC: 3.35 MIL/uL — ABNORMAL LOW (ref 3.87–5.11)
RDW: 14.3 % (ref 11.5–15.5)
WBC: 10.5 10*3/uL (ref 4.0–10.5)
nRBC: 0 % (ref 0.0–0.2)

## 2019-12-14 LAB — GLUCOSE, CAPILLARY
Glucose-Capillary: 115 mg/dL — ABNORMAL HIGH (ref 70–99)
Glucose-Capillary: 115 mg/dL — ABNORMAL HIGH (ref 70–99)
Glucose-Capillary: 125 mg/dL — ABNORMAL HIGH (ref 70–99)
Glucose-Capillary: 134 mg/dL — ABNORMAL HIGH (ref 70–99)
Glucose-Capillary: 203 mg/dL — ABNORMAL HIGH (ref 70–99)
Glucose-Capillary: 204 mg/dL — ABNORMAL HIGH (ref 70–99)

## 2019-12-14 LAB — COMPREHENSIVE METABOLIC PANEL
ALT: 18 U/L (ref 0–44)
AST: 23 U/L (ref 15–41)
Albumin: 3.3 g/dL — ABNORMAL LOW (ref 3.5–5.0)
Alkaline Phosphatase: 73 U/L (ref 38–126)
Anion gap: 11 (ref 5–15)
BUN: 20 mg/dL (ref 6–20)
CO2: 15 mmol/L — ABNORMAL LOW (ref 22–32)
Calcium: 8.2 mg/dL — ABNORMAL LOW (ref 8.9–10.3)
Chloride: 111 mmol/L (ref 98–111)
Creatinine, Ser: 1.27 mg/dL — ABNORMAL HIGH (ref 0.44–1.00)
GFR calc Af Amer: 53 mL/min — ABNORMAL LOW (ref >60)
GFR calc non Af Amer: 46 mL/min — ABNORMAL LOW (ref >60)
Glucose, Bld: 138 mg/dL — ABNORMAL HIGH (ref 70–99)
Potassium: 4.4 mmol/L (ref 3.5–5.1)
Sodium: 137 mmol/L (ref 135–145)
Total Bilirubin: 0.8 mg/dL (ref 0.3–1.2)
Total Protein: 6.1 g/dL — ABNORMAL LOW (ref 6.5–8.1)

## 2019-12-14 MED ORDER — FUROSEMIDE 10 MG/ML IJ SOLN
40.0000 mg | Freq: Once | INTRAMUSCULAR | Status: AC
  Administered 2019-12-14: 40 mg via INTRAVENOUS
  Filled 2019-12-14: qty 4

## 2019-12-14 NOTE — Progress Notes (Signed)
PROGRESS NOTE    Amy Nunez  [id number] DOB: June 24, 1961 DOA: 12/11/2019 PCP: Einar Pheasant, DO   Brief Narrative:  Per admitting MD: Amy J Nanceis a 59 y.o.femalewith medical history significant ofDM2, HTN. Renal cancer s/p R nephrectomy in Feb, also s/p appendectomy.who presented to ED at [facility] with c/o N/V/D onset yesterday. Associated generalized abd pain. Vomiting/NBNB which persisted initially despite zofran. Report of fever. .  ED Course:Tm 99.7, Lactates were 2.7 and up to 3.1 on repeat in ED. Given 500cc NS bolus and put on 75 cc/hr overnight.  Put on empiric cefepime, vanc, flagyl.  CXR neg, UA neg, CT abd/pelvis neg for anything acute (just expected post op changes from nephrectomy).   Assessment & Plan:   Principal Problem:   SIRS (systemic inflammatory response syndrome) (HCC) Active Problems:   Abdominal pain   Type 2 diabetes mellitus (HCC)   Benign essential HTN   Nausea vomiting and diarrhea   Intractable nausea and vomiting   1. SIRS - Low grade fever and WBC, unk source 1. Initially neg work up for source thus far includes: CT abd/pelvis, CXR, UA  2. Has persistent N/V/D 3. LA 2.7>3.1>2>2.1 4. Repeat CBC/BMP in AM Initial abx not started will add back in Vanc/cef/flagy; 5. Repeat bcx pending 6. IVF: 500cc bolus in ED, patient was subsequently placed on fluids secondary to soft blood pressure 7. Chest x-ray this morning shows edema volume overload, fluids discontinued Lasix 40 IV x1 8. Check echo 2. N/V/D, abd pain - 1. Zofran PRN 2. Cont home MS contin as bp allows, ?withdrawal 3. Holding home norco 4. Use PRN dilaudid if needed 5. Again, unremarkable CT for acute causes 3. DM2 - 1. Cont home Lantus 5u QHS 2. Sensitive SSI Q4H 4. HTN - 1. Cont home BP meds if bp toelrates, add parameters 2. But will hold aldactone  DVT prophylaxis: Lovenox SQ  Code Status: FULL    Code Status Orders  (From admission, onward)         Start     Ordered   12/12/19 0531  Full code  Continuous     12/12/19 0531        Code Status History    Date Active Date Inactive Code Status Order ID Comments User Context   11/03/2019 2256 11/04/2019 1635 Full Code [id number]  Leighton Ruff, MD Inpatient   09/19/2019 0258 09/20/2019 1832 Full Code [id number]  Phillips Grout, MD Inpatient   09/19/2019 0112 09/19/2019 0258 Full Code [id number]  Bonnell Public, MD Inpatient   09/03/2019 0003 09/04/2019 2133 Full Code [id number]  Elwyn Reach, MD Inpatient   Advance Care Planning Activity    Advance Directive Documentation     Most Recent Value  Type of Advance Directive  Healthcare Power of [place], Living will  Pre-existing out of facility DNR order (yellow form or pink MOST form)  --  "MOST" Form in Place?  --     Family Communication: None today Disposition Plan:   Intractable nausea and vomiting requiring IV antiemetics Consults called: None Admission status: Inpatient   Consultants:   None  Procedures:  DG Chest 2 View  Result Date: 12/11/2019 CLINICAL DATA:  Sepsis, nausea/vomiting/diarrhea, fever EXAM: CHEST - 2 VIEW COMPARISON:  09/08/2019 FINDINGS: Frontal and lateral views of the chest demonstrate a stable cardiac silhouette. No airspace disease, effusion, or pneumothorax. Postsurgical changes from right nephrectomy. No acute bony abnormalities. IMPRESSION: 1. No acute intrathoracic process. Electronically Signed   By:  Randa Ngo M.D.   On: 12/11/2019 22:26   CT Abdomen Pelvis W Contrast  Result Date: 12/11/2019 CLINICAL DATA:  Nausea, vomiting, diarrhea for 1 day. History of right renal cell carcinoma status post nephrectomy EXAM: CT ABDOMEN AND PELVIS WITH CONTRAST TECHNIQUE: Multidetector CT imaging of the abdomen and pelvis was performed using the standard protocol following bolus administration of intravenous contrast. CONTRAST:  47mL OMNIPAQUE IOHEXOL 300 MG/ML  SOLN COMPARISON:  11/03/2019 FINDINGS:  Lower chest: Visualized portions of the lung bases are clear. Hepatobiliary: No focal liver abnormality is seen. No gallstones, gallbladder wall thickening, or biliary dilatation. Pancreas: Unremarkable. No pancreatic ductal dilatation or surrounding inflammatory changes. Spleen: Normal in size without focal abnormality. Adrenals/Urinary Tract: Postsurgical changes from right nephrectomy. Compensatory hypertrophy of the left kidney. Stable left renal cyst. No obstructive uropathy. The bladder is unremarkable. The adrenals are normal. Stomach/Bowel: No bowel obstruction or ileus. Minimal diverticulosis of the descending colon without diverticulitis. The appendix is surgically absent. No bowel wall thickening or inflammatory change. There is a small hiatal hernia. Vascular/Lymphatic: Aortic atherosclerosis. No enlarged abdominal or pelvic lymph nodes. Reproductive: Status post hysterectomy. No adnexal masses. Other: No abdominal wall hernia or abnormality. No abdominopelvic ascites. Musculoskeletal: No acute or destructive bony lesions. Reconstructed images demonstrate no additional findings. IMPRESSION: 1. No acute intra-abdominal or intrapelvic process. 2. Stable postsurgical changes from right nephrectomy. 3. Stable hiatal hernia. 4.  Aortic Atherosclerosis (ICD10-I70.0). Electronically Signed   By: Randa Ngo M.D.   On: 12/11/2019 23:17   DG CHEST PORT 1 VIEW  Result Date: 12/14/2019 CLINICAL DATA:  Shortness of breath EXAM: PORTABLE CHEST 1 VIEW COMPARISON:  Radiograph 12/11/2019 FINDINGS: Lung volumes are low. There is diffuse mild hazy opacity with some cephalization of the pulmonary vascularity and peripheral septal lines with fissural thickening. No pneumothorax or visible effusion. The cardiac silhouette appears perhaps mildly enlarged from comparison prior exams. There is tortuosity of the brachiocephalic vessels contributing to opacity along the right upper mediastinal border. The aorta is  calcified. The remaining cardiomediastinal contours are unremarkable. Telemetry leads overlie the chest. No acute osseous or soft tissue abnormality. Degenerative changes are present in the imaged spine and shoulders. Cholecystectomy clips present in the right upper quadrant. IMPRESSION: 1. Features suggesting CHF/volume overload with pulmonary edema and perhaps mild cardiac enlargement from comparison exams. 2. Tortuosity of the brachiocephalic vessels contributing to opacity along the right upper mediastinal border. 3.  Aortic Atherosclerosis (ICD10-I70.0). Electronically Signed   By: Lovena Le M.D.   On: 12/14/2019 04:30     Antimicrobials:   Vanco cefepime and Flagyl >4/17   Subjective: Patient reports feeling poorly still with nausea and vomiting   Objective: Vitals:   12/13/19 1610 12/13/19 2049 12/14/19 0413 12/14/19 1411  BP:  107/70 112/77 (!) 92/59  Pulse: 90 88 81 (!) 110  Resp:  20 20 20   Temp:  98.7 F (37.1 C) 98 F (36.7 C) 98.3 F (36.8 C)  TempSrc:  Oral  Oral  SpO2: 99% 96% 100% 98%  Weight:      Height:        Intake/Output Summary (Last 24 hours) at 12/14/2019 1432 Last data filed at 12/14/2019 0956 Gross per 24 hour  Intake 2680.35 ml  Output --  Net 2680.35 ml   Filed Weights   12/11/19 2124 12/12/19 0411  Weight: 90.7 kg 87.2 kg    Examination:  General exam: Not feeling well, n/v Respiratory system: Clear to auscultation. Respiratory effort normal. Cardiovascular  system: S1 & S2 heard, RRR. No JVD, murmurs, rubs, gallops or clicks. No pedal edema. Gastrointestinal system: Abdomen is mildly ttp c/w n/v muscle pain, not rigid, pos bs, not distended Central nervous system: Alert and oriented. No focal neurological deficits. Extremities: wwp, no contractures Skin: No rashes, lesions or ulcers Psychiatry: Judgement and insight appear normal. Mood & affect appropriate.     Data Reviewed: I have personally reviewed following labs and imaging  studies  CBC: Recent Labs  Lab 12/11/19 2202 12/12/19 0552 12/13/19 0617 12/14/19 0726  WBC 12.4* 24.4* 15.5* 10.5  NEUTROABS 11.0*  --  12.0* 6.6  HGB 12.7 13.4 11.2* 10.7*  HCT 35.6* 39.7 34.3* 31.5*  MCV 91.0 94.5 96.9 94.0  PLT 402* 355 292 [id number]   Basic Metabolic Panel: Recent Labs  Lab 12/11/19 2157 12/12/19 0552 12/13/19 0617 12/14/19 0726  NA 131* 134* 136 137  K 4.6 4.2 3.7 4.4  CL 97* 102 106 111  CO2 18* 18* 18* 15*  GLUCOSE 337* 278* 152* 138*  BUN 22* 20 20 20   CREATININE 1.25* 1.21* 1.52* 1.27*  CALCIUM 9.9 9.4 8.1* 8.2*   GFR: Estimated Creatinine Clearance: 51.9 mL/min (A) (by C-G formula based on SCr of 1.27 mg/dL (H)). Liver Function Tests: Recent Labs  Lab 12/11/19 2157 12/14/19 0726  AST 27 23  ALT 24 18  ALKPHOS 123 73  BILITOT 0.7 0.8  PROT 8.6* 6.1*  ALBUMIN 4.7 3.3*   Recent Labs  Lab 12/11/19 2157  LIPASE 37   No results for input(s): AMMONIA in the last 168 hours. Coagulation Profile: Recent Labs  Lab 12/11/19 2202  INR 1.0   Cardiac Enzymes: No results for input(s): CKTOTAL, CKMB, CKMBINDEX, TROPONINI in the last 168 hours. BNP (last 3 results) No results for input(s): PROBNP in the last 8760 hours. HbA1C: Recent Labs    12/12/19 0552  HGBA1C 7.7*   CBG: Recent Labs  Lab 12/13/19 2051 12/14/19 0000 12/14/19 0417 12/14/19 0734 12/14/19 1147  GLUCAP 122* 134* 115* 125* 204*   Lipid Profile: No results for input(s): CHOL, HDL, LDLCALC, TRIG, CHOLHDL, LDLDIRECT in the last 72 hours. Thyroid Function Tests: No results for input(s): TSH, T4TOTAL, FREET4, T3FREE, THYROIDAB in the last 72 hours. Anemia Panel: No results for input(s): VITAMINB12, FOLATE, FERRITIN, TIBC, IRON, RETICCTPCT in the last 72 hours. Sepsis Labs: Recent Labs  Lab 12/11/19 2202 12/12/19 0003 12/12/19 0552 12/12/19 0825  LATICACIDVEN 2.7* 3.1* 2.0* 2.1*    Recent Results (from the past 240 hour(s))  Culture, blood (Routine x 2)      Status: None (Preliminary result)   Collection Time: 12/11/19  9:55 PM   Specimen: BLOOD  Result Value Ref Range Status   Specimen Description   Final    BLOOD RIGHT ANTECUBITAL Performed at [facility], [address] [place], [place] [address]    Special Requests   Final    BOTTLES DRAWN AEROBIC AND ANAEROBIC Blood Culture adequate volume Performed at [facility], [address] [place], [place] [address]    Culture   Final    NO GROWTH 2 DAYS Performed at [facility], [place] [address], [place], [place] [address]    Report Status PENDING  Incomplete  Respiratory Panel by RT PCR (Flu A&B, Covid) - Nasopharyngeal Swab     Status: None   Collection Time: 12/12/19 12:06 AM   Specimen: Nasopharyngeal Swab  Result Value Ref Range Status   SARS  Coronavirus 2 by RT PCR NEGATIVE NEGATIVE Final    Comment: (NOTE) SARS-CoV-2 target nucleic acids are NOT DETECTED. The SARS-CoV-2 RNA is generally detectable in upper respiratoy specimens during the acute phase of infection. The lowest concentration of SARS-CoV-2 viral copies this assay can detect is 131 copies/mL. A negative result does not preclude SARS-Cov-2 infection and should not be used as the sole basis for treatment or other patient management decisions. A negative result may occur with  improper specimen collection/handling, submission of specimen other than nasopharyngeal swab, presence of viral mutation(s) within the areas targeted by this assay, and inadequate number of viral copies (<131 copies/mL). A negative result must be combined with clinical observations, patient history, and epidemiological information. The expected result is Negative. Fact Sheet for Patients:  PinkCheek.be Fact Sheet for Healthcare Providers:  GravelBags.it This test is not yet ap proved or cleared by the [place] FDA and  has been authorized for detection  and/or diagnosis of SARS-CoV-2 by FDA under an Emergency Use Authorization (EUA). This EUA will remain  in effect (meaning this test can be used) for the duration of the COVID-19 declaration under Section 564(b)(1) of the Act, 21 U.S.C. section 360bbb-3(b)(1), unless the authorization is terminated or revoked sooner.    Influenza A by PCR NEGATIVE NEGATIVE Final   Influenza B by PCR NEGATIVE NEGATIVE Final    Comment: (NOTE) The Xpert Xpress SARS-CoV-2/FLU/RSV assay is intended as an aid in  the diagnosis of influenza from Nasopharyngeal swab specimens and  should not be used as a sole basis for treatment. Nasal washings and  aspirates are unacceptable for Xpert Xpress SARS-CoV-2/FLU/RSV  testing. Fact Sheet for Patients: PinkCheek.be Fact Sheet for Healthcare Providers: GravelBags.it This test is not yet approved or cleared by the [place] FDA and  has been authorized for detection and/or diagnosis of SARS-CoV-2 by  FDA under an Emergency Use Authorization (EUA). This EUA will remain  in effect (meaning this test can be used) for the duration of the  Covid-19 declaration under Section 564(b)(1) of the Act, 21  U.S.C. section 360bbb-3(b)(1), unless the authorization is  terminated or revoked. Performed at [facility], [address] [place], [place] [address]   MRSA PCR Screening     Status: None   Collection Time: 12/12/19  3:33 AM   Specimen: Nasopharyngeal  Result Value Ref Range Status   MRSA by PCR NEGATIVE NEGATIVE Final    Comment:        The GeneXpert MRSA Assay (FDA approved for NASAL specimens only), is one component of a comprehensive MRSA colonization surveillance program. It is not intended to diagnose MRSA infection nor to guide or monitor treatment for MRSA infections. Performed at [facility], [place] [address], [place], [place] [address]   Culture, blood (routine  x 2)     Status: None (Preliminary result)   Collection Time: 12/13/19  3:14 PM   Specimen: BLOOD  Result Value Ref Range Status   Specimen Description   Final    BLOOD RIGHT HAND Performed at [facility] [address], [place], [place] [address]    Special Requests   Final    BOTTLES DRAWN AEROBIC ONLY Blood Culture adequate volume Performed at [facility] [address] [place], [place] [address]    Culture   Final    NO GROWTH < 24 HOURS Performed at [facility] [address] [place], [place] [address]  Report Status PENDING  Incomplete  Culture, blood (routine x 2)     Status: None (Preliminary result)   Collection Time: 12/13/19  3:14 PM   Specimen: BLOOD  Result Value Ref Range Status   Specimen Description   Final    BLOOD LEFT ARM Performed at [facility] [address], [place], [place] [address]    Special Requests   Final    BOTTLES DRAWN AEROBIC ONLY Blood Culture adequate volume Performed at [facility] [address], [place], [place] [address]    Culture   Final    NO GROWTH < 24 HOURS Performed at [facility] [address], [place], [place] [address]    Report Status PENDING  Incomplete         Radiology Studies: DG CHEST PORT 1 VIEW  Result Date: 12/14/2019 CLINICAL DATA:  Shortness of breath EXAM: PORTABLE CHEST 1 VIEW COMPARISON:  Radiograph 12/11/2019 FINDINGS: Lung volumes are low. There is diffuse mild hazy opacity with some cephalization of the pulmonary vascularity and peripheral septal lines with fissural thickening. No pneumothorax or visible effusion. The cardiac silhouette appears perhaps mildly enlarged from comparison prior exams. There is tortuosity of the brachiocephalic vessels contributing to opacity along the right upper mediastinal border. The aorta is calcified. The remaining cardiomediastinal contours are unremarkable. Telemetry  leads overlie the chest. No acute osseous or soft tissue abnormality. Degenerative changes are present in the imaged spine and shoulders. Cholecystectomy clips present in the right upper quadrant. IMPRESSION: 1. Features suggesting CHF/volume overload with pulmonary edema and perhaps mild cardiac enlargement from comparison exams. 2. Tortuosity of the brachiocephalic vessels contributing to opacity along the right upper mediastinal border. 3.  Aortic Atherosclerosis (ICD10-I70.0). Electronically Signed   By: Lovena Le M.D.   On: 12/14/2019 04:30        Scheduled Meds: . amLODipine  10 mg Oral Daily  . atorvastatin  80 mg Oral q1800  . Chlorhexidine Gluconate Cloth  6 each Topical Daily  . enoxaparin (LOVENOX) injection  40 mg Subcutaneous Daily  . hydrALAZINE  50 mg Oral Q8H  . insulin aspart  0-9 Units Subcutaneous Q4H  . insulin glargine  5 Units Subcutaneous QHS  . lisinopril  20 mg Oral Daily  . morphine  30 mg Oral Q12H  . pantoprazole (PROTONIX) IV  40 mg Intravenous Q12H  . QUEtiapine  200 mg Oral Daily  . QUEtiapine  400 mg Oral QHS   Continuous Infusions: . ceFEPime (MAXIPIME) IV Stopped (12/13/19 1706)  . metronidazole 500 mg (12/14/19 0529)  . vancomycin 750 mg (12/14/19 1339)     LOS: 2 days    Time spent: [place], MD [facility]  If 7PM-7AM, please contact night-coverage  12/14/2019, 2:32 PM

## 2019-12-15 ENCOUNTER — Inpatient Hospital Stay (HOSPITAL_COMMUNITY): Payer: Medicare HMO

## 2019-12-15 DIAGNOSIS — R0609 Other forms of dyspnea: Secondary | ICD-10-CM | POA: Diagnosis not present

## 2019-12-15 LAB — CBC WITH DIFFERENTIAL/PLATELET
Abs Immature Granulocytes: 0.05 10*3/uL (ref 0.00–0.07)
Basophils Absolute: 0 10*3/uL (ref 0.0–0.1)
Basophils Relative: 0 %
Eosinophils Absolute: 0.3 10*3/uL (ref 0.0–0.5)
Eosinophils Relative: 3 %
HCT: 30.8 % — ABNORMAL LOW (ref 36.0–46.0)
Hemoglobin: 10.3 g/dL — ABNORMAL LOW (ref 12.0–15.0)
Immature Granulocytes: 1 %
Lymphocytes Relative: 22 %
Lymphs Abs: 2.1 10*3/uL (ref 0.7–4.0)
MCH: 31.7 pg (ref 26.0–34.0)
MCHC: 33.4 g/dL (ref 30.0–36.0)
MCV: 94.8 fL (ref 80.0–100.0)
Monocytes Absolute: 0.8 10*3/uL (ref 0.1–1.0)
Monocytes Relative: 9 %
Neutro Abs: 6.2 10*3/uL (ref 1.7–7.7)
Neutrophils Relative %: 65 %
Platelets: 272 10*3/uL (ref 150–400)
RBC: 3.25 MIL/uL — ABNORMAL LOW (ref 3.87–5.11)
RDW: 14 % (ref 11.5–15.5)
WBC: 9.5 10*3/uL (ref 4.0–10.5)
nRBC: 0 % (ref 0.0–0.2)

## 2019-12-15 LAB — COMPREHENSIVE METABOLIC PANEL
ALT: 16 U/L (ref 0–44)
AST: 23 U/L (ref 15–41)
Albumin: 3.1 g/dL — ABNORMAL LOW (ref 3.5–5.0)
Alkaline Phosphatase: 74 U/L (ref 38–126)
Anion gap: 9 (ref 5–15)
BUN: 24 mg/dL — ABNORMAL HIGH (ref 6–20)
CO2: 16 mmol/L — ABNORMAL LOW (ref 22–32)
Calcium: 8.4 mg/dL — ABNORMAL LOW (ref 8.9–10.3)
Chloride: 109 mmol/L (ref 98–111)
Creatinine, Ser: 1.43 mg/dL — ABNORMAL HIGH (ref 0.44–1.00)
GFR calc Af Amer: 46 mL/min — ABNORMAL LOW (ref >60)
GFR calc non Af Amer: 40 mL/min — ABNORMAL LOW (ref >60)
Glucose, Bld: 151 mg/dL — ABNORMAL HIGH (ref 70–99)
Potassium: 4.8 mmol/L (ref 3.5–5.1)
Sodium: 134 mmol/L — ABNORMAL LOW (ref 135–145)
Total Bilirubin: 0.9 mg/dL (ref 0.3–1.2)
Total Protein: 5.9 g/dL — ABNORMAL LOW (ref 6.5–8.1)

## 2019-12-15 LAB — GLUCOSE, CAPILLARY
Glucose-Capillary: 135 mg/dL — ABNORMAL HIGH (ref 70–99)
Glucose-Capillary: 136 mg/dL — ABNORMAL HIGH (ref 70–99)
Glucose-Capillary: 138 mg/dL — ABNORMAL HIGH (ref 70–99)
Glucose-Capillary: 140 mg/dL — ABNORMAL HIGH (ref 70–99)
Glucose-Capillary: 143 mg/dL — ABNORMAL HIGH (ref 70–99)
Glucose-Capillary: 146 mg/dL — ABNORMAL HIGH (ref 70–99)
Glucose-Capillary: 256 mg/dL — ABNORMAL HIGH (ref 70–99)

## 2019-12-15 LAB — ECHOCARDIOGRAM COMPLETE
Height: 65.5 in
Weight: 3075.86 oz

## 2019-12-15 MED ORDER — SACCHAROMYCES BOULARDII 250 MG PO CAPS
250.0000 mg | ORAL_CAPSULE | Freq: Two times a day (BID) | ORAL | Status: DC
  Administered 2019-12-15 – 2019-12-18 (×7): 250 mg via ORAL
  Filled 2019-12-15 (×7): qty 1

## 2019-12-15 MED ORDER — BISACODYL 10 MG RE SUPP
10.0000 mg | Freq: Once | RECTAL | Status: AC
  Administered 2019-12-15: 10 mg via RECTAL
  Filled 2019-12-15 (×2): qty 1

## 2019-12-15 MED ORDER — POLYETHYLENE GLYCOL 3350 17 G PO PACK
17.0000 g | PACK | Freq: Two times a day (BID) | ORAL | Status: DC
  Administered 2019-12-15 – 2019-12-18 (×6): 17 g via ORAL
  Filled 2019-12-15 (×6): qty 1

## 2019-12-15 MED ORDER — SODIUM BICARBONATE 650 MG PO TABS
650.0000 mg | ORAL_TABLET | Freq: Two times a day (BID) | ORAL | Status: DC
  Administered 2019-12-15 – 2019-12-18 (×6): 650 mg via ORAL
  Filled 2019-12-15 (×6): qty 1

## 2019-12-15 MED ORDER — METOCLOPRAMIDE HCL 5 MG PO TABS
5.0000 mg | ORAL_TABLET | Freq: Three times a day (TID) | ORAL | Status: DC
  Administered 2019-12-15 – 2019-12-18 (×13): 5 mg via ORAL
  Filled 2019-12-15 (×13): qty 1

## 2019-12-15 NOTE — Care Management Important Message (Signed)
Important Message  Patient Details IM Letter given to [name] RN Case Manager to present to the Patient Name: Amy Nunez INWOOD MRN: [id number] Date of Birth: 1960-09-23   Medicare Important Message Given:  Yes     Kerin Salen 12/15/2019, 10:23 AM

## 2019-12-15 NOTE — Progress Notes (Signed)
   12/15/19 1349  Assess: MEWS Score  Temp 98.6 F (37 C)  BP 140/78  Pulse Rate (!) 116  Resp 18  SpO2 98 %  Assess: MEWS Score  MEWS Temp 0  MEWS Systolic 0  MEWS Pulse 2  MEWS RR 0  MEWS LOC 0  MEWS Score 2  MEWS Score Color Yellow  Assess: if the MEWS score is Yellow or Red  Were vital signs taken at a resting state? Yes  Early Detection of Sepsis Score *See Row Information* Low  MEWS guidelines implemented *See Row Information* Yes  Treat  MEWS Interventions Administered scheduled meds/treatments  Take Vital Signs  Increase Vital Sign Frequency  Yellow: Q 2hr X 2 then Q 4hr X 2, if remains yellow, continue Q 4hrs  Escalate  MEWS: Escalate Yellow: discuss with charge nurse/RN and consider discussing with provider and RRT  Notify: Charge Nurse/RN  Name of Charge Nurse/RN Notified [place]  Date Charge Nurse/RN Notified 12/15/19  Time Charge Nurse/RN Notified 1349    Patient had previous episode of tachycardia this stay.  Will continue to monitor via yellow mews protocol at this time.  Virginia Rochester, RN

## 2019-12-15 NOTE — Progress Notes (Addendum)
[facility]  PROGRESS NOTE  Amy Nunez [id number] DOB: 02-06-1961 DOA: 12/11/2019 PCP: Einar Pheasant, DO   Brief HPI:   59 year old female with medical history of diabetes mellitus type 2, hypertension, renal cancer status post nephrectomy in February, s/p appendectomy who came to ED from [facility] with complaint of nausea vomiting and diarrhea .  Patient had associated generalized abdominal pain.  As per patient she has not moved her bowels since last Thursday, 12/11/2019.    Subjective   Patient seen and examined, continues to have lower abdominal pain.  Last BM was on 12/11/2019.  Denies chest pain or shortness of breath.  She did receive 1 dose of Lasix yesterday for pulmonary edema/fluid overload.  Echocardiogram does show diastolic dysfunction.   Assessment/Plan:     1. Abdominal pain/constipation-patient has longstanding history of opioid use for chronic osteoarthritis.  Patient says that her Vicodin was changed to Percocet after she underwent laparoscopic appendectomy.  Since that time patient has experienced constipation.  Last BM was last week.  She was empirically started on IV antibiotics.  CT abdomen/pelvis was negative for acute abnormality.  Will treat her constipation, start Dulcolax suppository 10 mg PR x1, start MiraLAX 17 g twice daily.  Continue morphine 1 mg every 4 hours as needed.  Start Florastor to 50 mg p.o. twice daily.  Will discontinue IV antibiotics. 2. Sinus tachycardia-patient has sinus tachycardia with heart rate between 110-1 20s.  Likely induced from pain.  Continue pain management as above.  Will discontinue hydralazine, as this medication can cause tachycardia.  Patient was not on hydralazine as outpatient. 3. Diabetes mellitus type 2-continue sliding scale insulin with NovoLog, CBG well controlled.  Lantus 5 units subcu daily. 4. Hypertension-continue amlodipine, lisinopril.  Hydralazine discontinued as above.  Will  monitor. 5. Mild pulmonary edema-resolved, patient received IV Lasix 40 mg yesterday for mild pulmonary edema seen on chest x-ray.  Echocardiogram shows grade 1 diastolic dysfunction, with EF 7 to 75%. 6. CKD stage IIIb-creatinine 1.43 today, at baseline.  Will monitor patient's creatinine in a.m. 7. Metabolic acidosis-likely hyperchloremic metabolic acidosis.  IV fluids have been discontinued.  Start sodium bicarb tablets 650 mg p.o. twice daily.  Follow bicarb level in a.m.    SpO2: 96 %   COVID-19 Labs  No results for input(s): DDIMER, FERRITIN, LDH, CRP in the last 72 hours.  Lab Results  Component Value Date   [place] NEGATIVE 12/12/2019   [place] NEGATIVE 11/03/2019   [place] NEGATIVE 09/18/2019   [place] NEGATIVE 09/01/2019     CBG: Recent Labs  Lab 12/15/19 0016 12/15/19 0419 12/15/19 0745 12/15/19 1136 12/15/19 1639  GLUCAP 143* 138* 136* 256* 140*    CBC: Recent Labs  Lab 12/11/19 2202 12/12/19 0552 12/13/19 0617 12/14/19 0726 12/15/19 0610  WBC 12.4* 24.4* 15.5* 10.5 9.5  NEUTROABS 11.0*  --  12.0* 6.6 6.2  HGB 12.7 13.4 11.2* 10.7* 10.3*  HCT 35.6* 39.7 34.3* 31.5* 30.8*  MCV 91.0 94.5 96.9 94.0 94.8  PLT 402* 355 292 251 [id number]    Basic Metabolic Panel: Recent Labs  Lab 12/11/19 2157 12/12/19 0552 12/13/19 0617 12/14/19 0726 12/15/19 0610  NA 131* 134* 136 137 134*  K 4.6 4.2 3.7 4.4 4.8  CL 97* 102 106 111 109  CO2 18* 18* 18* 15* 16*  GLUCOSE 337* 278* 152* 138* 151*  BUN 22* 20 20 20  24*  CREATININE 1.25* 1.21* 1.52* 1.27* 1.43*  CALCIUM 9.9 9.4 8.1* 8.2* 8.4*  Liver Function Tests: Recent Labs  Lab 12/11/19 2157 12/14/19 0726 12/15/19 0610  AST 27 23 23   ALT 24 18 16   ALKPHOS 123 73 74  BILITOT 0.7 0.8 0.9  PROT 8.6* 6.1* 5.9*  ALBUMIN 4.7 3.3* 3.1*        DVT prophylaxis: Lovenox  Code Status: Full code  Family Communication: No family at bedside  Disposition Plan: Patient mated with  intractable nausea vomiting, abdominal pain.  Barrier to discharge-ongoing abdominal pain, tachycardia.  Patient also has constipation     Status is: Inpatient    Dispo: The patient is from: Home              Anticipated d/c is to: Home              Anticipated d/c date is: 12/17/2019              Patient currently medically stable   Scheduled medications:  . amLODipine  10 mg Oral Daily  . atorvastatin  80 mg Oral q1800  . Chlorhexidine Gluconate Cloth  6 each Topical Daily  . enoxaparin (LOVENOX) injection  40 mg Subcutaneous Daily  . hydrALAZINE  50 mg Oral Q8H  . insulin aspart  0-9 Units Subcutaneous Q4H  . insulin glargine  5 Units Subcutaneous QHS  . lisinopril  20 mg Oral Daily  . metoCLOPramide  5 mg Oral TID AC & HS  . morphine  30 mg Oral Q12H  . pantoprazole (PROTONIX) IV  40 mg Intravenous Q12H  . polyethylene glycol  17 g Oral BID  . QUEtiapine  200 mg Oral Daily  . QUEtiapine  400 mg Oral QHS  . saccharomyces boulardii  250 mg Oral BID    Consultants:    Procedures:    Antibiotics:   Anti-infectives (From admission, onward)   Start     Dose/Rate Route Frequency Ordered Stop   12/13/19 1530  ceFEPIme (MAXIPIME) 2 g in sodium chloride 0.9 % 100 mL IVPB  Status:  Discontinued     2 g 200 mL/hr over 30 Minutes Intravenous Every 12 hours 12/13/19 1440 12/15/19 0911   12/13/19 1530  vancomycin (VANCOREADY) IVPB 750 mg/150 mL  Status:  Discontinued     750 mg 150 mL/hr over 60 Minutes Intravenous Every 24 hours 12/13/19 1440 12/15/19 0911   12/13/19 1500  metroNIDAZOLE (FLAGYL) IVPB 500 mg  Status:  Discontinued     500 mg 100 mL/hr over 60 Minutes Intravenous Every 8 hours 12/13/19 1429 12/15/19 1126   12/12/19 2300  vancomycin (VANCOCIN) IVPB 1000 mg/200 mL premix  Status:  Discontinued     1,000 mg 200 mL/hr over 60 Minutes Intravenous Every 24 hours 12/11/19 2358 12/12/19 0539   12/12/19 0800  metroNIDAZOLE (FLAGYL) IVPB 500 mg  Status:   Discontinued     500 mg 100 mL/hr over 60 Minutes Intravenous Every 8 hours 12/12/19 0526 12/12/19 0909   12/12/19 0015  vancomycin (VANCOCIN) IVPB 1000 mg/200 mL premix     1,000 mg 200 mL/hr over 60 Minutes Intravenous Every 1 hr x 2 12/12/19 0001 12/12/19 0214   12/12/19 0000  vancomycin (VANCOREADY) IVPB 1750 mg/350 mL  Status:  Discontinued     1,750 mg 175 mL/hr over 120 Minutes Intravenous  Once 12/11/19 2349 12/12/19 0001   12/12/19 0000  ceFEPIme (MAXIPIME) 2 g in sodium chloride 0.9 % 100 mL IVPB  Status:  Discontinued     2 g 200 mL/hr over 30 Minutes Intravenous  Every 12 hours 12/11/19 2357 12/12/19 0909   12/11/19 2345  aztreonam (AZACTAM) 2 g in sodium chloride 0.9 % 100 mL IVPB  Status:  Discontinued     2 g 200 mL/hr over 30 Minutes Intravenous  Once 12/11/19 2343 12/11/19 2357   12/11/19 2345  metroNIDAZOLE (FLAGYL) IVPB 500 mg     500 mg 100 mL/hr over 60 Minutes Intravenous  Once 12/11/19 2343 12/12/19 0115   12/11/19 2345  vancomycin (VANCOCIN) IVPB 1000 mg/200 mL premix  Status:  Discontinued     1,000 mg 200 mL/hr over 60 Minutes Intravenous  Once 12/11/19 2343 12/11/19 2349       Objective   Vitals:   12/14/19 2045 12/15/19 0416 12/15/19 1349 12/15/19 1539  BP: 131/79 118/77 140/78 107/69  Pulse: (!) 108 (!) 110 (!) 116 (!) 121  Resp: 20 20 18 18   Temp: 98.5 F (36.9 C) 98 F (36.7 C) 98.6 F (37 C) 98.7 F (37.1 C)  TempSrc: Oral Oral Oral Oral  SpO2: 99% 100% 98% 96%  Weight:      Height:        Intake/Output Summary (Last 24 hours) at 12/15/2019 1653 Last data filed at 12/15/2019 1400 Gross per 24 hour  Intake 480 ml  Output 1100 ml  Net -620 ml    04/17 1901 - 04/19 0700 In: 1580.4 [P.O.:240; I.V.:890.4] Out: 600 [Urine:600]  Filed Weights   12/11/19 2124 12/12/19 0411  Weight: 90.7 kg 87.2 kg    Physical Examination:   General-appears in no acute distress Heart-S1-S2, regular, no murmur auscultated Lungs-clear to auscultation  bilaterally, no wheezing or crackles auscultated Abdomen-soft, nontender, no organomegaly Extremities-no edema in the lower extremities Neuro-alert, oriented x3, no focal deficit noted   Data Reviewed:   Recent Results (from the past 240 hour(s))  Culture, blood (Routine x 2)     Status: None (Preliminary result)   Collection Time: 12/11/19  9:55 PM   Specimen: BLOOD  Result Value Ref Range Status   Specimen Description   Final    BLOOD RIGHT ANTECUBITAL Performed at [facility], [address] [place], [place] [address]    Special Requests   Final    BOTTLES DRAWN AEROBIC AND ANAEROBIC Blood Culture adequate volume Performed at [facility], [address] [place], [place] [address]    Culture   Final    NO GROWTH 3 DAYS Performed at [facility], [place] [address], [place], [place] [address]    Report Status PENDING  Incomplete  Respiratory Panel by RT PCR (Flu A&B, Covid) - Nasopharyngeal Swab     Status: None   Collection Time: 12/12/19 12:06 AM   Specimen: Nasopharyngeal Swab  Result Value Ref Range Status   SARS Coronavirus 2 by RT PCR NEGATIVE NEGATIVE Final    Comment: (NOTE) SARS-CoV-2 target nucleic acids are NOT DETECTED. The SARS-CoV-2 RNA is generally detectable in upper respiratoy specimens during the acute phase of infection. The lowest concentration of SARS-CoV-2 viral copies this assay can detect is 131 copies/mL. A negative result does not preclude SARS-Cov-2 infection and should not be used as the sole basis for treatment or other patient management decisions. A negative result may occur with  improper specimen collection/handling, submission of specimen other than nasopharyngeal swab, presence of viral mutation(s) within the areas targeted by this assay, and inadequate number of viral copies (<131 copies/mL). A negative result must be combined with clinical observations, patient history,  and epidemiological information.  The expected result is Negative. Fact Sheet for Patients:  PinkCheek.be Fact Sheet for Healthcare Providers:  GravelBags.it This test is not yet ap proved or cleared by the [place] FDA and  has been authorized for detection and/or diagnosis of SARS-CoV-2 by FDA under an Emergency Use Authorization (EUA). This EUA will remain  in effect (meaning this test can be used) for the duration of the COVID-19 declaration under Section 564(b)(1) of the Act, 21 U.S.C. section 360bbb-3(b)(1), unless the authorization is terminated or revoked sooner.    Influenza A by PCR NEGATIVE NEGATIVE Final   Influenza B by PCR NEGATIVE NEGATIVE Final    Comment: (NOTE) The Xpert Xpress SARS-CoV-2/FLU/RSV assay is intended as an aid in  the diagnosis of influenza from Nasopharyngeal swab specimens and  should not be used as a sole basis for treatment. Nasal washings and  aspirates are unacceptable for Xpert Xpress SARS-CoV-2/FLU/RSV  testing. Fact Sheet for Patients: PinkCheek.be Fact Sheet for Healthcare Providers: GravelBags.it This test is not yet approved or cleared by the [place] FDA and  has been authorized for detection and/or diagnosis of SARS-CoV-2 by  FDA under an Emergency Use Authorization (EUA). This EUA will remain  in effect (meaning this test can be used) for the duration of the  Covid-19 declaration under Section 564(b)(1) of the Act, 21  U.S.C. section 360bbb-3(b)(1), unless the authorization is  terminated or revoked. Performed at [facility], [address] [place], [place] [address]   MRSA PCR Screening     Status: None   Collection Time: 12/12/19  3:33 AM   Specimen: Nasopharyngeal  Result Value Ref Range Status   MRSA by PCR NEGATIVE NEGATIVE Final    Comment:        The GeneXpert MRSA Assay (FDA approved for NASAL specimens only), is one  component of a comprehensive MRSA colonization surveillance program. It is not intended to diagnose MRSA infection nor to guide or monitor treatment for MRSA infections. Performed at [facility], [place] [address] [place], [place] [address]   Culture, blood (routine x 2)     Status: None (Preliminary result)   Collection Time: 12/13/19  3:14 PM   Specimen: BLOOD  Result Value Ref Range Status   Specimen Description   Final    BLOOD RIGHT HAND Performed at [facility] [address], [place], [place] [address]    Special Requests   Final    BOTTLES DRAWN AEROBIC ONLY Blood Culture adequate volume Performed at [facility] [address] [place], [place] [address]    Culture   Final    NO GROWTH 2 DAYS Performed at [facility] [address], [place], [place] [address]    Report Status PENDING  Incomplete  Culture, blood (routine x 2)     Status: None (Preliminary result)   Collection Time: 12/13/19  3:14 PM   Specimen: BLOOD  Result Value Ref Range Status   Specimen Description   Final    BLOOD LEFT ARM Performed at [facility] [address] [place], [place] [address]    Special Requests   Final    BOTTLES DRAWN AEROBIC ONLY Blood Culture adequate volume Performed at [facility] [address], [place], [place] [address]    Culture   Final    NO GROWTH 2 DAYS Performed at [facility] [place],  [place] [address]    Report Status PENDING  Incomplete    Recent Labs  Lab 12/11/19 2157  LIPASE 37   No results for input(s): AMMONIA in the last 168 hours.  Cardiac Enzymes: No results for input(s): CKTOTAL, CKMB, CKMBINDEX, TROPONINI in the last 168 hours. BNP (last 3 results) No results for input(s): BNP in the last 8760 hours.  ProBNP (last 3 results) No results for input(s): PROBNP in the last 8760 hours.  Studies:  DG CHEST PORT  1 VIEW  Result Date: 12/14/2019 CLINICAL DATA:  Shortness of breath EXAM: PORTABLE CHEST 1 VIEW COMPARISON:  Radiograph 12/11/2019 FINDINGS: Lung volumes are low. There is diffuse mild hazy opacity with some cephalization of the pulmonary vascularity and peripheral septal lines with fissural thickening. No pneumothorax or visible effusion. The cardiac silhouette appears perhaps mildly enlarged from comparison prior exams. There is tortuosity of the brachiocephalic vessels contributing to opacity along the right upper mediastinal border. The aorta is calcified. The remaining cardiomediastinal contours are unremarkable. Telemetry leads overlie the chest. No acute osseous or soft tissue abnormality. Degenerative changes are present in the imaged spine and shoulders. Cholecystectomy clips present in the right upper quadrant. IMPRESSION: 1. Features suggesting CHF/volume overload with pulmonary edema and perhaps mild cardiac enlargement from comparison exams. 2. Tortuosity of the brachiocephalic vessels contributing to opacity along the right upper mediastinal border. 3.  Aortic Atherosclerosis (ICD10-I70.0). Electronically Signed   By: Lovena Le M.D.   On: 12/14/2019 04:30   ECHOCARDIOGRAM COMPLETE  Result Date: 12/15/2019    ECHOCARDIOGRAM REPORT   Patient Name:   Amy Nunez Date of Exam: 12/15/2019 Medical Rec #:  [id number]        Height:       65.5 in Accession #:    [id number]       Weight:       192.2 lb Date of Birth:  21-Oct-1960        BSA:          1.956 m Patient Age:    5 years         BP:           140/78 mmHg Patient Gender: F                HR:           108 bpm. Exam Location:  Inpatient Procedure: 2D Echo Indications:    786.09 dyspnea  History:        Patient has prior history of Echocardiogram examinations, most                 recent 09/03/2019. Risk Factors:Diabetes, Hypertension and Former                 Smoker. Cancer.  Sonographer:    Jannett Celestine RDCS (AE) Referring Phys: [id number]  CHRISTOPHER N SPONGBERG  Sonographer Comments: off axis apical window. exam performed supine IMPRESSIONS  1. Left ventricular ejection fraction, by estimation, is 70 to 75%. The left ventricle has hyperdynamic function. The left ventricle has no regional wall motion abnormalities. There is mild left ventricular hypertrophy. Left ventricular diastolic parameters are consistent with Grade I diastolic dysfunction (impaired relaxation).  2. Right ventricular systolic function is normal. The right ventricular size is normal.  3. The mitral valve is normal in structure. No evidence of mitral valve regurgitation. No evidence of mitral stenosis.  4. The aortic valve is normal in structure. Aortic valve regurgitation is not visualized. No  aortic stenosis is present.  5. The inferior vena cava is normal in size with greater than 50% respiratory variability, suggesting right atrial pressure of 3 mmHg. FINDINGS  Left Ventricle: Left ventricular ejection fraction, by estimation, is 70 to 75%. The left ventricle has hyperdynamic function. The left ventricle has no regional wall motion abnormalities. The left ventricular internal cavity size was normal in size. There is mild left ventricular hypertrophy. Left ventricular diastolic parameters are consistent with Grade I diastolic dysfunction (impaired relaxation). Right Ventricle: The right ventricular size is normal. No increase in right ventricular wall thickness. Right ventricular systolic function is normal. Left Atrium: Left atrial size was normal in size. Right Atrium: Right atrial size was normal in size. Pericardium: There is no evidence of pericardial effusion. Mitral Valve: The mitral valve is normal in structure. Normal mobility of the mitral valve leaflets. No evidence of mitral valve regurgitation. No evidence of mitral valve stenosis. Tricuspid Valve: The tricuspid valve is normal in structure. Tricuspid valve regurgitation is trivial. No evidence of tricuspid  stenosis. Aortic Valve: The aortic valve is normal in structure. Aortic valve regurgitation is not visualized. No aortic stenosis is present. Pulmonic Valve: The pulmonic valve was normal in structure. Pulmonic valve regurgitation is not visualized. No evidence of pulmonic stenosis. Aorta: The aortic root is normal in size and structure. Venous: The inferior vena cava is normal in size with greater than 50% respiratory variability, suggesting right atrial pressure of 3 mmHg. IAS/Shunts: No atrial level shunt detected by color flow Doppler.  LEFT VENTRICLE PLAX 2D LVIDd:         3.30 cm LVIDs:         2.20 cm LV PW:         1.30 cm LV IVS:        1.40 cm LVOT diam:     2.20 cm LV SV:         62 LV SV Index:   32 LVOT Area:     3.80 cm  LEFT ATRIUM         Index LA diam:    3.30 cm 1.69 cm/m  AORTIC VALVE LVOT Vmax:   108.00 cm/s LVOT Vmean:  73.400 cm/s LVOT VTI:    0.164 m  AORTA Ao Root diam: 3.30 cm  SHUNTS Systemic VTI:  0.16 m Systemic Diam: 2.20 cm Candee Furbish MD Electronically signed by Candee Furbish MD Signature Date/Time: 12/15/2019/3:07:01 PM    Final      Admission status: The appropriate admission status for this patient is INPATIENT. Inpatient status is judged to be reasonable and necessary in order to provide the required intensity of service to ensure the patient's safety. The patient's presenting symptoms, physical exam findings, and initial radiographic and laboratory data in the context of their chronic comorbidities is felt to place them at high risk for further clinical deterioration. Furthermore, it is not anticipated that the patient will be medically stable for discharge from the hospital within 2 midnights of admission. The following factors support the admission status of inpatient.    The patient's presenting symptoms include nausea vomiting, abdominal pain The worrisome physical exam findings include dehydration The initial radiographic and laboratory data are worrisome because of  dehydration The chronic co-morbidities include diabetes mellitus type 2, hypertension    * I certify that at the point of admission it is my clinical judgment that the patient will require inpatient hospital care spanning beyond 2 midnights from the point of admission due to  high intensity of service, high risk for further deterioration and high frequency of surveillance required.Oswald Hillock   [facility] If 7PM-7AM, please contact night-coverage at [email or web address], Office  [phone]   12/15/2019, 4:53 PM  LOS: 3 days

## 2019-12-15 NOTE — Progress Notes (Signed)
PT Cancellation Note  Patient Details Name: Amy Nunez MRN: [id number] DOB: Feb 06, 1961   Cancelled Treatment:    Reason Eval/Treat Not Completed: Patient declined, no reason specified. Upon arrival, RN reports pt with slightly elevated HR. Pt declined pt eval due to stomach pain/nausea and asked PT return tomorrow. Will attempt eval tomorrow.    Talbot Grumbling PT, DPT 12/15/19, 2:41 PM [phone]

## 2019-12-15 NOTE — Progress Notes (Signed)
  Echocardiogram 2D Echocardiogram has been performed.  Jannett Celestine 12/15/2019, 2:59 PM

## 2019-12-16 DIAGNOSIS — K5900 Constipation, unspecified: Secondary | ICD-10-CM

## 2019-12-16 LAB — GLUCOSE, CAPILLARY
Glucose-Capillary: 299 mg/dL — ABNORMAL HIGH (ref 70–99)
Glucose-Capillary: 137 mg/dL — ABNORMAL HIGH (ref 70–99)
Glucose-Capillary: 137 mg/dL — ABNORMAL HIGH (ref 70–99)
Glucose-Capillary: 217 mg/dL — ABNORMAL HIGH (ref 70–99)
Glucose-Capillary: 174 mg/dL — ABNORMAL HIGH (ref 70–99)
Glucose-Capillary: 185 mg/dL — ABNORMAL HIGH (ref 70–99)

## 2019-12-16 LAB — CBC WITH DIFFERENTIAL/PLATELET
Abs Immature Granulocytes: 0.03 10*3/uL (ref 0.00–0.07)
Basophils Absolute: 0 10*3/uL (ref 0.0–0.1)
Basophils Relative: 0 %
Eosinophils Absolute: 0.3 10*3/uL (ref 0.0–0.5)
Eosinophils Relative: 3 %
HCT: 33.3 % — ABNORMAL LOW (ref 36.0–46.0)
Hemoglobin: 10.7 g/dL — ABNORMAL LOW (ref 12.0–15.0)
Immature Granulocytes: 0 %
Lymphocytes Relative: 25 %
Lymphs Abs: 2.2 10*3/uL (ref 0.7–4.0)
MCH: 31.8 pg (ref 26.0–34.0)
MCHC: 32.1 g/dL (ref 30.0–36.0)
MCV: 98.8 fL (ref 80.0–100.0)
Monocytes Absolute: 0.8 10*3/uL (ref 0.1–1.0)
Monocytes Relative: 9 %
Neutro Abs: 5.6 10*3/uL (ref 1.7–7.7)
Neutrophils Relative %: 63 %
Platelets: 310 10*3/uL (ref 150–400)
RBC: 3.37 MIL/uL — ABNORMAL LOW (ref 3.87–5.11)
RDW: 13.9 % (ref 11.5–15.5)
WBC: 8.8 10*3/uL (ref 4.0–10.5)
nRBC: 0 % (ref 0.0–0.2)

## 2019-12-16 LAB — BASIC METABOLIC PANEL
Anion gap: 8 (ref 5–15)
BUN: 20 mg/dL (ref 6–20)
CO2: 19 mmol/L — ABNORMAL LOW (ref 22–32)
Calcium: 8.9 mg/dL (ref 8.9–10.3)
Chloride: 108 mmol/L (ref 98–111)
Creatinine, Ser: 1.2 mg/dL — ABNORMAL HIGH (ref 0.44–1.00)
GFR calc Af Amer: 57 mL/min — ABNORMAL LOW (ref >60)
GFR calc non Af Amer: 49 mL/min — ABNORMAL LOW (ref >60)
Glucose, Bld: 154 mg/dL — ABNORMAL HIGH (ref 70–99)
Potassium: 3.9 mmol/L (ref 3.5–5.1)
Sodium: 135 mmol/L (ref 135–145)

## 2019-12-16 MED ORDER — SENNA 8.6 MG PO TABS
2.0000 | ORAL_TABLET | Freq: Every day | ORAL | Status: DC
  Administered 2019-12-16: 17.2 mg via ORAL
  Filled 2019-12-16: qty 2

## 2019-12-16 MED ORDER — MILK AND MOLASSES ENEMA
1.0000 | Freq: Once | RECTAL | Status: DC
  Filled 2019-12-16: qty 240

## 2019-12-16 MED ORDER — BISACODYL 10 MG RE SUPP
10.0000 mg | Freq: Once | RECTAL | Status: AC
  Administered 2019-12-16: 10 mg via RECTAL
  Filled 2019-12-16: qty 1

## 2019-12-16 NOTE — Progress Notes (Signed)
[facility]  PROGRESS NOTE  Amy Nunez [id number] DOB: 13-Jan-1961 DOA: 12/11/2019 PCP: Einar Pheasant, DO   Brief HPI:   59 year old female with medical history of diabetes mellitus type 2, hypertension, renal cancer status post nephrectomy in February, s/p appendectomy who came to ED from [facility] with complaint of nausea vomiting and diarrhea .  Patient had associated generalized abdominal pain.  As per patient she has not moved her bowels since last Thursday, 12/11/2019.    Subjective   Patient seen and examined, still has abdominal pain with constipation.  No improvement with MiraLAX and 1 suppository of Dulcolax.  Sinus tachycardia has resolved after stopping hydralazine.   Assessment/Plan:     1. Abdominal pain/constipation-patient has longstanding history of opioid use for chronic osteoarthritis.  Patient says that her Vicodin was changed to Percocet after she underwent laparoscopic appendectomy.  Since that time patient has experienced constipation.  Last BM was last week.  She was empirically started on IV antibiotics.  CT abdomen/pelvis was negative for acute abnormality.  Patient was started on Dulcolax suppository PR x1 along with MiraLAX 17 g twice a day.  Patient refused enema today.  We will start Senokot tablets 2 tablets p.o. nightly.  IV antibiotics were discontinued.  Started on Florastor 250 mg p.o. twice daily. 2. Sinus tachycardia-resolved, patient developed sinus tachycardia after she was started on hydralazine for uncontrolled hypertension.  Tachycardia has resolved after stopping hydralazine.   3. Diabetes mellitus type 2-continue sliding scale insulin with NovoLog, CBG well controlled.  Lantus 5 units subcu daily. 4. Hypertension-blood pressure is stable, continue amlodipine, lisinopril.  Hydralazine discontinued as above.  Will monitor. 5. Mild pulmonary edema-resolved, patient received IV Lasix 40 mg yesterday for mild pulmonary edema  seen on chest x-ray.  Echocardiogram shows grade 1 diastolic dysfunction, with EF 70 to 75%. 6. CKD stage IIIb-creatinine has improved to 1.20 today. 7. Metabolic acidosis-likely hyperchloremic metabolic acidosis.  IV fluids have been discontinued.  Started on  sodium bicarb tablets 650 mg p.o. twice daily.  Bicarb level 19, is improving.    SpO2: 97 %   COVID-19 Labs  No results for input(s): DDIMER, FERRITIN, LDH, CRP in the last 72 hours.  Lab Results  Component Value Date   SARSCOV2NAA NEGATIVE 12/12/2019   [place] NEGATIVE 11/03/2019   [place] NEGATIVE 09/18/2019   [place] NEGATIVE 09/01/2019     CBG: Recent Labs  Lab 12/15/19 2357 12/16/19 0418 12/16/19 0750 12/16/19 1152 12/16/19 1648  GLUCAP 135* 137* 137* 217* 174*    CBC: Recent Labs  Lab 12/11/19 2202 12/11/19 2202 12/12/19 0552 12/13/19 0617 12/14/19 0726 12/15/19 0610 12/16/19 0636  WBC 12.4*   < > 24.4* 15.5* 10.5 9.5 8.8  NEUTROABS 11.0*  --   --  12.0* 6.6 6.2 5.6  HGB 12.7   < > 13.4 11.2* 10.7* 10.3* 10.7*  HCT 35.6*   < > 39.7 34.3* 31.5* 30.8* 33.3*  MCV 91.0   < > 94.5 96.9 94.0 94.8 98.8  PLT 402*   < > 355 292 251 272 310   < > = values in this interval not displayed.    Basic Metabolic Panel: Recent Labs  Lab 12/12/19 0552 12/13/19 0617 12/14/19 0726 12/15/19 0610 12/16/19 0636  NA 134* 136 137 134* 135  K 4.2 3.7 4.4 4.8 3.9  CL 102 106 111 109 108  CO2 18* 18* 15* 16* 19*  GLUCOSE 278* 152* 138* 151* 154*  BUN 20 20  20 24* 20  CREATININE 1.21* 1.52* 1.27* 1.43* 1.20*  CALCIUM 9.4 8.1* 8.2* 8.4* 8.9     Liver Function Tests: Recent Labs  Lab 12/11/19 2157 12/14/19 0726 12/15/19 0610  AST 27 23 23   ALT 24 18 16   ALKPHOS 123 73 74  BILITOT 0.7 0.8 0.9  PROT 8.6* 6.1* 5.9*  ALBUMIN 4.7 3.3* 3.1*        DVT prophylaxis: Lovenox  Code Status: Full code  Family Communication: Discussed with patient's daughter at bedside  Disposition Plan:  Patient mated with intractable nausea vomiting, abdominal pain.  Barrier to discharge-ongoing abdominal pain and constipation     Status is: Inpatient    Dispo: The patient is from: Home              Anticipated d/c is to: Home              Anticipated d/c date is: 12/18/2019              Patient currently medically stable   Scheduled medications:  . amLODipine  10 mg Oral Daily  . atorvastatin  80 mg Oral q1800  . Chlorhexidine Gluconate Cloth  6 each Topical Daily  . enoxaparin (LOVENOX) injection  40 mg Subcutaneous Daily  . insulin aspart  0-9 Units Subcutaneous Q4H  . insulin glargine  5 Units Subcutaneous QHS  . lisinopril  20 mg Oral Daily  . metoCLOPramide  5 mg Oral TID AC & HS  . milk and molasses  1 enema Rectal Once  . morphine  30 mg Oral Q12H  . pantoprazole (PROTONIX) IV  40 mg Intravenous Q12H  . polyethylene glycol  17 g Oral BID  . QUEtiapine  200 mg Oral Daily  . QUEtiapine  400 mg Oral QHS  . saccharomyces boulardii  250 mg Oral BID  . senna  2 tablet Oral QHS  . sodium bicarbonate  650 mg Oral BID    Consultants:    Procedures:    Antibiotics:   Anti-infectives (From admission, onward)   Start     Dose/Rate Route Frequency Ordered Stop   12/13/19 1530  ceFEPIme (MAXIPIME) 2 g in sodium chloride 0.9 % 100 mL IVPB  Status:  Discontinued     2 g 200 mL/hr over 30 Minutes Intravenous Every 12 hours 12/13/19 1440 12/15/19 0911   12/13/19 1530  vancomycin (VANCOREADY) IVPB 750 mg/150 mL  Status:  Discontinued     750 mg 150 mL/hr over 60 Minutes Intravenous Every 24 hours 12/13/19 1440 12/15/19 0911   12/13/19 1500  metroNIDAZOLE (FLAGYL) IVPB 500 mg  Status:  Discontinued     500 mg 100 mL/hr over 60 Minutes Intravenous Every 8 hours 12/13/19 1429 12/15/19 1126   12/12/19 2300  vancomycin (VANCOCIN) IVPB 1000 mg/200 mL premix  Status:  Discontinued     1,000 mg 200 mL/hr over 60 Minutes Intravenous Every 24 hours 12/11/19 2358 12/12/19 0539    12/12/19 0800  metroNIDAZOLE (FLAGYL) IVPB 500 mg  Status:  Discontinued     500 mg 100 mL/hr over 60 Minutes Intravenous Every 8 hours 12/12/19 0526 12/12/19 0909   12/12/19 0015  vancomycin (VANCOCIN) IVPB 1000 mg/200 mL premix     1,000 mg 200 mL/hr over 60 Minutes Intravenous Every 1 hr x 2 12/12/19 0001 12/12/19 0214   12/12/19 0000  vancomycin (VANCOREADY) IVPB 1750 mg/350 mL  Status:  Discontinued     1,750 mg 175 mL/hr over 120 Minutes Intravenous  Once 12/11/19 2349 12/12/19 0001   12/12/19 0000  ceFEPIme (MAXIPIME) 2 g in sodium chloride 0.9 % 100 mL IVPB  Status:  Discontinued     2 g 200 mL/hr over 30 Minutes Intravenous Every 12 hours 12/11/19 2357 12/12/19 0909   12/11/19 2345  aztreonam (AZACTAM) 2 g in sodium chloride 0.9 % 100 mL IVPB  Status:  Discontinued     2 g 200 mL/hr over 30 Minutes Intravenous  Once 12/11/19 2343 12/11/19 2357   12/11/19 2345  metroNIDAZOLE (FLAGYL) IVPB 500 mg     500 mg 100 mL/hr over 60 Minutes Intravenous  Once 12/11/19 2343 12/12/19 0115   12/11/19 2345  vancomycin (VANCOCIN) IVPB 1000 mg/200 mL premix  Status:  Discontinued     1,000 mg 200 mL/hr over 60 Minutes Intravenous  Once 12/11/19 2343 12/11/19 2349       Objective   Vitals:   12/15/19 2355 12/16/19 0417 12/16/19 1046 12/16/19 1446  BP: 106/68 111/71 140/78 126/70  Pulse: (!) 124 91 (!) 104 97  Resp: 16 16    Temp: 98.2 F (36.8 C) 98.5 F (36.9 C) 98.6 F (37 C) 99.2 F (37.3 C)  TempSrc:   Oral Oral  SpO2: 99% 99% 99% 97%  Weight:      Height:        Intake/Output Summary (Last 24 hours) at 12/16/2019 1700 Last data filed at 12/16/2019 1015 Gross per 24 hour  Intake 480 ml  Output 925 ml  Net -445 ml    04/18 1901 - 04/20 0700 In: 840 [P.O.:840] Out: [id number] [Urine:1775]  Filed Weights   12/11/19 2124 12/12/19 0411  Weight: 90.7 kg 87.2 kg    Physical Examination:   General-appears in no acute distress Heart-S1-S2, regular, no murmur  auscultated Lungs-clear to auscultation bilaterally, no wheezing or crackles auscultated Abdomen-soft, nontender, no organomegaly Extremities-no edema in the lower extremities Neuro-alert, oriented x3, no focal deficit noted   Data Reviewed:   Recent Results (from the past 240 hour(s))  Culture, blood (Routine x 2)     Status: None (Preliminary result)   Collection Time: 12/11/19  9:55 PM   Specimen: BLOOD  Result Value Ref Range Status   Specimen Description   Final    BLOOD RIGHT ANTECUBITAL Performed at [facility], [address] [place], [place] [address]    Special Requests   Final    BOTTLES DRAWN AEROBIC AND ANAEROBIC Blood Culture adequate volume Performed at [facility], [address] [place], [place] [address]    Culture   Final    NO GROWTH 4 DAYS Performed at [facility], [place] [address] [place], [place] [address]    Report Status PENDING  Incomplete  Respiratory Panel by RT PCR (Flu A&B, Covid) - Nasopharyngeal Swab     Status: None   Collection Time: 12/12/19 12:06 AM   Specimen: Nasopharyngeal Swab  Result Value Ref Range Status   SARS Coronavirus 2 by RT PCR NEGATIVE NEGATIVE Final    Comment: (NOTE) SARS-CoV-2 target nucleic acids are NOT DETECTED. The SARS-CoV-2 RNA is generally detectable in upper respiratoy specimens during the acute phase of infection. The lowest concentration of SARS-CoV-2 viral copies this assay can detect is 131 copies/mL. A negative result does not preclude SARS-Cov-2 infection and should not be used as the sole basis for treatment or other patient management decisions. A negative result may occur with  improper specimen collection/handling, submission of  specimen other than nasopharyngeal swab, presence of viral mutation(s) within the areas targeted by this assay, and inadequate number of viral copies (<131 copies/mL). A negative result must be combined with clinical observations, patient  history, and epidemiological information. The expected result is Negative. Fact Sheet for Patients:  PinkCheek.be Fact Sheet for Healthcare Providers:  GravelBags.it This test is not yet ap proved or cleared by the [place] FDA and  has been authorized for detection and/or diagnosis of SARS-CoV-2 by FDA under an Emergency Use Authorization (EUA). This EUA will remain  in effect (meaning this test can be used) for the duration of the COVID-19 declaration under Section 564(b)(1) of the Act, 21 U.S.C. section 360bbb-3(b)(1), unless the authorization is terminated or revoked sooner.    Influenza A by PCR NEGATIVE NEGATIVE Final   Influenza B by PCR NEGATIVE NEGATIVE Final    Comment: (NOTE) The Xpert Xpress SARS-CoV-2/FLU/RSV assay is intended as an aid in  the diagnosis of influenza from Nasopharyngeal swab specimens and  should not be used as a sole basis for treatment. Nasal washings and  aspirates are unacceptable for Xpert Xpress SARS-CoV-2/FLU/RSV  testing. Fact Sheet for Patients: PinkCheek.be Fact Sheet for Healthcare Providers: GravelBags.it This test is not yet approved or cleared by the [place] FDA and  has been authorized for detection and/or diagnosis of SARS-CoV-2 by  FDA under an Emergency Use Authorization (EUA). This EUA will remain  in effect (meaning this test can be used) for the duration of the  Covid-19 declaration under Section 564(b)(1) of the Act, 21  U.S.C. section 360bbb-3(b)(1), unless the authorization is  terminated or revoked. Performed at [facility], [address] [place], [place] [address]   MRSA PCR Screening     Status: None   Collection Time: 12/12/19  3:33 AM   Specimen: Nasopharyngeal  Result Value Ref Range Status   MRSA by PCR NEGATIVE NEGATIVE Final    Comment:        The GeneXpert MRSA Assay  (FDA approved for NASAL specimens only), is one component of a comprehensive MRSA colonization surveillance program. It is not intended to diagnose MRSA infection nor to guide or monitor treatment for MRSA infections. Performed at [facility], [place] [address] [place], [place] [address]   Culture, blood (routine x 2)     Status: None (Preliminary result)   Collection Time: 12/13/19  3:14 PM   Specimen: BLOOD  Result Value Ref Range Status   Specimen Description   Final    BLOOD RIGHT HAND Performed at [facility] [address] [place], [place] [address]    Special Requests   Final    BOTTLES DRAWN AEROBIC ONLY Blood Culture adequate volume Performed at [facility] [address], [place], [place] [address]    Culture   Final    NO GROWTH 3 DAYS Performed at [facility], [place] [address] [place], [place] [address]    Report Status PENDING  Incomplete  Culture, blood (routine x 2)     Status: None (Preliminary result)   Collection Time: 12/13/19  3:14 PM   Specimen: BLOOD  Result Value Ref Range Status   Specimen Description   Final    BLOOD LEFT ARM Performed at [facility] [address], [place], [place] [address]    Special Requests   Final    BOTTLES DRAWN AEROBIC ONLY Blood Culture adequate volume Performed at [facility]  [place] [address], [place], [place] [address]    Culture   Final    NO GROWTH 3 DAYS Performed at [facility], [place] [address] [place], [place] [address]    Report Status PENDING  Incomplete    Recent Labs  Lab 12/11/19 2157  LIPASE 37   No results for input(s): AMMONIA in the last 168 hours.  Cardiac Enzymes: No results for input(s): CKTOTAL, CKMB, CKMBINDEX, TROPONINI in the last 168 hours. BNP (last 3 results) No results for input(s): BNP in the last 8760 hours.  ProBNP (last 3 results) No results for input(s): PROBNP in  the last 8760 hours.  Studies:  ECHOCARDIOGRAM COMPLETE  Result Date: 12/15/2019    ECHOCARDIOGRAM REPORT   Patient Name:   MILI CLAYTOR Date of Exam: 12/15/2019 Medical Rec #:  [id number]        Height:       65.5 in Accession #:    [id number]       Weight:       192.2 lb Date of Birth:  [birth year]        BSA:          1.956 m Patient Age:    89 years         BP:           140/78 mmHg Patient Gender: F                HR:           108 bpm. Exam Location:  Inpatient Procedure: 2D Echo Indications:    786.09 dyspnea  History:        Patient has prior history of Echocardiogram examinations, most                 recent 09/03/2019. Risk Factors:Diabetes, Hypertension and Former                 Smoker. Cancer.  Sonographer:    Jannett Celestine RDCS (AE) Referring Phys: [phone] CHRISTOPHER N SPONGBERG  Sonographer Comments: off axis apical window. exam performed supine IMPRESSIONS  1. Left ventricular ejection fraction, by estimation, is 70 to 75%. The left ventricle has hyperdynamic function. The left ventricle has no regional wall motion abnormalities. There is mild left ventricular hypertrophy. Left ventricular diastolic parameters are consistent with Grade I diastolic dysfunction (impaired relaxation).  2. Right ventricular systolic function is normal. The right ventricular size is normal.  3. The mitral valve is normal in structure. No evidence of mitral valve regurgitation. No evidence of mitral stenosis.  4. The aortic valve is normal in structure. Aortic valve regurgitation is not visualized. No aortic stenosis is present.  5. The inferior vena cava is normal in size with greater than 50% respiratory variability, suggesting right atrial pressure of 3 mmHg. FINDINGS  Left Ventricle: Left ventricular ejection fraction, by estimation, is 70 to 75%. The left ventricle has hyperdynamic function. The left ventricle has no regional wall motion abnormalities. The left ventricular internal cavity size was normal in size.  There is mild left ventricular hypertrophy. Left ventricular diastolic parameters are consistent with Grade I diastolic dysfunction (impaired relaxation). Right Ventricle: The right ventricular size is normal. No increase in right ventricular wall thickness. Right ventricular systolic function is normal. Left Atrium: Left atrial size was normal in size. Right Atrium: Right atrial size was normal in size. Pericardium: There is no evidence of pericardial effusion. Mitral Valve: The mitral valve is normal  in structure. Normal mobility of the mitral valve leaflets. No evidence of mitral valve regurgitation. No evidence of mitral valve stenosis. Tricuspid Valve: The tricuspid valve is normal in structure. Tricuspid valve regurgitation is trivial. No evidence of tricuspid stenosis. Aortic Valve: The aortic valve is normal in structure. Aortic valve regurgitation is not visualized. No aortic stenosis is present. Pulmonic Valve: The pulmonic valve was normal in structure. Pulmonic valve regurgitation is not visualized. No evidence of pulmonic stenosis. Aorta: The aortic root is normal in size and structure. Venous: The inferior vena cava is normal in size with greater than 50% respiratory variability, suggesting right atrial pressure of 3 mmHg. IAS/Shunts: No atrial level shunt detected by color flow Doppler.  LEFT VENTRICLE PLAX 2D LVIDd:         3.30 cm LVIDs:         2.20 cm LV PW:         1.30 cm LV IVS:        1.40 cm LVOT diam:     2.20 cm LV SV:         62 LV SV Index:   32 LVOT Area:     3.80 cm  LEFT ATRIUM         Index LA diam:    3.30 cm 1.69 cm/m  AORTIC VALVE LVOT Vmax:   108.00 cm/s LVOT Vmean:  73.400 cm/s LVOT VTI:    0.164 m  AORTA Ao Root diam: 3.30 cm  SHUNTS Systemic VTI:  0.16 m Systemic Diam: 2.20 cm Candee Furbish MD Electronically signed by Candee Furbish MD Signature Date/Time: 12/15/2019/3:07:01 PM    Final      Admission status: The appropriate admission status for this patient is INPATIENT.  Inpatient status is judged to be reasonable and necessary in order to provide the required intensity of service to ensure the patient's safety. The patient's presenting symptoms, physical exam findings, and initial radiographic and laboratory data in the context of their chronic comorbidities is felt to place them at high risk for further clinical deterioration. Furthermore, it is not anticipated that the patient will be medically stable for discharge from the hospital within 2 midnights of admission. The following factors support the admission status of inpatient.    The patient's presenting symptoms include nausea vomiting, abdominal pain The worrisome physical exam findings include dehydration The initial radiographic and laboratory data are worrisome because of dehydration The chronic co-morbidities include diabetes mellitus type 2, hypertension    * I certify that at the point of admission it is my clinical judgment that the patient will require inpatient hospital care spanning beyond 2 midnights from the point of admission due to high intensity of service, high risk for further deterioration and high frequency of surveillance required.Oswald Hillock   [facility] If 7PM-7AM, please contact night-coverage at [email or web address], Office  [phone]   12/16/2019, 5:00 PM  LOS: 4 days

## 2019-12-16 NOTE — Evaluation (Signed)
Physical Therapy Evaluation Patient Details Name: Amy Nunez MRN: [id number] DOB: [birth year] Today's Date: 12/16/2019   History of Present Illness  Amy Nunez is a 59 y.o. female with medical history significant of DM2, HTN.  Renal cancer s/p R nephrectomy in Feb, also s/p appendectomy.who presented to ED at [facility] with c/o N/V/D.  Associated generalized abd pain. H/o chronic opiod use 2* OA.  Clinical Impression  Pt ambulated 240' without an assistive device, no loss of balance, HR 119 ambulating, HR 101 at rest. No dyspnea noted. 8/10 abdominal pain at rest and with ambulation. Pt is independent with mobility, no further PT indicated. Will sign off. Encouraged pt to ambulate in halls 2-3x/day to minimize deconditioning during hospitalization.    Follow Up Recommendations No PT follow up    Equipment Recommendations  None recommended by PT    Recommendations for Other Services       Precautions / Restrictions Precautions Precautions: Other (comment) Precaution Comments: monitor HR Restrictions Weight Bearing Restrictions: No      Mobility  Bed Mobility Overal bed mobility: Independent                Transfers Overall transfer level: Independent                  Ambulation/Gait Ambulation/Gait assistance: Independent Gait Distance (Feet): 240 Feet Assistive device: None Gait Pattern/deviations: WFL(Within Functional Limits) Gait velocity: WFL   General Gait Details: steady, no loss of balance, HR 101 at rest, 119 with walking, no dyspnea noted  Stairs            Wheelchair Mobility    Modified Rankin (Stroke Patients Only)       Balance Overall balance assessment: Independent                                           Pertinent Vitals/Pain Pain Assessment: 0-10 Pain Score: 8  Pain Location: stomach Pain Descriptors / Indicators: Aching Pain Intervention(s): Limited activity within patient's  tolerance;Monitored during session;Premedicated before session    Home Living Family/patient expects to be discharged to:: Private residence Living Arrangements: Children Available Help at Discharge: Family;Available PRN/intermittently   Home Access: Level entry     Home Layout: Two level;Able to live on main level with bedroom/bathroom Home Equipment: None      Prior Function Level of Independence: Independent               Hand Dominance        Extremity/Trunk Assessment   Upper Extremity Assessment Upper Extremity Assessment: Overall WFL for tasks assessed    Lower Extremity Assessment Lower Extremity Assessment: Overall WFL for tasks assessed    Cervical / Trunk Assessment Cervical / Trunk Assessment: Normal  Communication   Communication: No difficulties  Cognition Arousal/Alertness: Awake/alert Behavior During Therapy: WFL for tasks assessed/performed Overall Cognitive Status: Within Functional Limits for tasks assessed                                        General Comments      Exercises     Assessment/Plan    PT Assessment Patent does not need any further PT services  PT Problem List         PT Treatment Interventions  PT Goals (Current goals can be found in the Care Plan section)  Acute Rehab PT Goals Patient Stated Goal: likes to do arts and crafts PT Goal Formulation: All assessment and education complete, DC therapy    Frequency     Barriers to discharge        Co-evaluation               AM-PAC PT "6 Clicks" Mobility  Outcome Measure Help needed turning from your back to your side while in a flat bed without using bedrails?: None Help needed moving from lying on your back to sitting on the side of a flat bed without using bedrails?: None Help needed moving to and from a bed to a chair (including a wheelchair)?: None Help needed standing up from a chair using your arms (e.g., wheelchair or bedside  chair)?: None Help needed to walk in hospital room?: None Help needed climbing 3-5 steps with a railing? : None 6 Click Score: 24    End of Session Equipment Utilized During Treatment: Gait belt Activity Tolerance: Patient tolerated treatment well Patient left: in bed Nurse Communication: Mobility status      Time: [id number] PT Time Calculation (min) (ACUTE ONLY): 10 min   Charges:   PT Evaluation $PT Eval Low Complexity: 1 Low        Philomena Doheny PT 12/16/2019  Acute Rehabilitation Services Pager [phone] Office [phone]

## 2019-12-16 NOTE — Progress Notes (Signed)
Attempted to administer milk and molasses enema per MD order but pt adamantly refused. Educated pt on importance of receiving enema as it would help facilitate a BM. Pt verbalized understanding and stated she still did not want to receive it.

## 2019-12-17 LAB — GLUCOSE, CAPILLARY
Glucose-Capillary: 120 mg/dL — ABNORMAL HIGH (ref 70–99)
Glucose-Capillary: 141 mg/dL — ABNORMAL HIGH (ref 70–99)
Glucose-Capillary: 211 mg/dL — ABNORMAL HIGH (ref 70–99)
Glucose-Capillary: 112 mg/dL — ABNORMAL HIGH (ref 70–99)
Glucose-Capillary: 236 mg/dL — ABNORMAL HIGH (ref 70–99)

## 2019-12-17 LAB — BASIC METABOLIC PANEL
Anion gap: 10 (ref 5–15)
BUN: 19 mg/dL (ref 6–20)
CO2: 19 mmol/L — ABNORMAL LOW (ref 22–32)
Calcium: 8.8 mg/dL — ABNORMAL LOW (ref 8.9–10.3)
Chloride: 107 mmol/L (ref 98–111)
Creatinine, Ser: 1.22 mg/dL — ABNORMAL HIGH (ref 0.44–1.00)
GFR calc Af Amer: 56 mL/min — ABNORMAL LOW (ref >60)
GFR calc non Af Amer: 48 mL/min — ABNORMAL LOW (ref >60)
Glucose, Bld: 159 mg/dL — ABNORMAL HIGH (ref 70–99)
Potassium: 5 mmol/L (ref 3.5–5.1)
Sodium: 136 mmol/L (ref 135–145)

## 2019-12-17 LAB — CULTURE, BLOOD (ROUTINE X 2)
Culture: NO GROWTH
Special Requests: ADEQUATE

## 2019-12-17 MED ORDER — INSULIN ASPART 100 UNIT/ML ~~LOC~~ SOLN
0.0000 [IU] | Freq: Every day | SUBCUTANEOUS | Status: DC
  Administered 2019-12-17: 2 [IU] via SUBCUTANEOUS

## 2019-12-17 MED ORDER — METOPROLOL TARTRATE 12.5 MG HALF TABLET
12.5000 mg | ORAL_TABLET | Freq: Two times a day (BID) | ORAL | Status: DC
  Administered 2019-12-17 – 2019-12-18 (×3): 12.5 mg via ORAL
  Filled 2019-12-17 (×3): qty 1

## 2019-12-17 MED ORDER — PANTOPRAZOLE SODIUM 40 MG PO TBEC
40.0000 mg | DELAYED_RELEASE_TABLET | Freq: Two times a day (BID) | ORAL | Status: DC
  Administered 2019-12-17 – 2019-12-18 (×3): 40 mg via ORAL
  Filled 2019-12-17 (×3): qty 1

## 2019-12-17 MED ORDER — DOCUSATE SODIUM 100 MG PO CAPS
100.0000 mg | ORAL_CAPSULE | Freq: Two times a day (BID) | ORAL | Status: DC
  Administered 2019-12-17 – 2019-12-18 (×2): 100 mg via ORAL
  Filled 2019-12-17 (×2): qty 1

## 2019-12-17 MED ORDER — INSULIN ASPART 100 UNIT/ML ~~LOC~~ SOLN
0.0000 [IU] | Freq: Three times a day (TID) | SUBCUTANEOUS | Status: DC
  Administered 2019-12-18 (×2): 2 [IU] via SUBCUTANEOUS

## 2019-12-17 NOTE — Progress Notes (Signed)
PHARMACIST - PHYSICIAN COMMUNICATION  DR:   Reesa Chew  CONCERNING: IV to Oral Route Change Policy  RECOMMENDATION: This patient is receiving pantoprazole by the intravenous route.  Based on criteria approved by the Pharmacy and Therapeutics Committee, the intravenous medication(s) is/are being converted to the equivalent oral dose form(s).   DESCRIPTION: These criteria include:  The patient is eating (either orally or via tube) and/or has been taking other orally administered medications for a least 24 hours  The patient has no evidence of active gastrointestinal bleeding or impaired GI absorption (gastrectomy, short bowel, patient on TNA or NPO).  If you have questions about this conversion, please contact the [place], [facility] 12/17/2019 10:06 AM

## 2019-12-17 NOTE — Progress Notes (Signed)
PROGRESS NOTE    Amy Nunez  [id number] DOB: [birth year] DOA: 12/11/2019 PCP: Einar Pheasant, DO   Brief Narrative:  59 year old with history of DM2, HTN, renal cell cancer status post nephrectomy in February, status post appendectomy presented from [place] with complaints of nausea vomiting and diarrhea.  Unknown exact etiology, CT of the abdomen pelvis is negative.   Assessment & Plan:   Principal Problem:   SIRS (systemic inflammatory response syndrome) (HCC) Active Problems:   Abdominal pain   Type 2 diabetes mellitus (HCC)   Benign essential HTN   Nausea vomiting and diarrhea   Intractable nausea and vomiting  Abdominal pain/nausea and vomiting -No acute pathology seen on CT.  Concerns of possible constipation secondary to narcotic use from arthritis -Refusing enema K with taking p.o. laxatives -Continue p.o. laxatives at this point.  Supportive care. -Florastor twice daily -Diet as tolerated  Sinus tachycardia -No obvious signs of withdrawal.  We will continue to monitor -Add metoprolol 25 mg twice daily. -Hydralazine stopped  Essential hypertension -Norvasc, lisinopril.  Diabetes mellitus type 2 -Insulin sliding scale Accu-Chek, Lantus  Pulmonary edema secondary to congestive heart failure with preserved ejection fraction, [id number], grade 1 diastolic dysfunction -Status post IV Lasix.  Saturating well today.  Appears close to euvolemic.  CKD stage IIIb Metabolic acidosis -Creatinine around 1.2.  Continue to monitor. -On bicarb supplements twice daily  DVT prophylaxis: Lovenox Code Status: Full code Family Communication: None Disposition Plan:   Patient From= home  Patient Anticipated D/C place= home  Barriers= maintain hospital status patient none fully able to tolerate p.o. at this time.  Holding off on IV fluids due to volume overload.  Slowly advancing diet as tolerated.   Subjective: Still reports of lower abdominal discomfort  with feeling of nausea.  Review of Systems Otherwise negative except as per HPI, including: General: Denies fever, chills, night sweats or unintended weight loss. Resp: Denies cough, wheezing, shortness of breath. Cardiac: Denies chest pain, palpitations, orthopnea, paroxysmal nocturnal dyspnea. GI: Denies , diarrhea or constipation GU: Denies dysuria, frequency, hesitancy or incontinence MS: Denies muscle aches, joint pain or swelling Neuro: Denies headache, neurologic deficits (focal weakness, numbness, tingling), abnormal gait Psych: Denies anxiety, depression, SI/HI/AVH Skin: Denies new rashes or lesions ID: Denies sick contacts, exotic exposures, travel  Examination:  General exam: Appears calm and comfortable  Respiratory system: Clear to auscultation. Respiratory effort normal. Cardiovascular system: S1 & S2 heard, RRR. No JVD, murmurs, rubs, gallops or clicks. No pedal edema. Gastrointestinal system: Abdomen is nondistended, soft and nontender. No organomegaly or masses felt. Normal bowel sounds heard. Central nervous system: Alert and oriented. No focal neurological deficits. Extremities: Symmetric 5 x 5 power. Skin: No rashes, lesions or ulcers Psychiatry: Judgement and insight appear normal. Mood & affect appropriate.     Objective: Vitals:   12/16/19 1046 12/16/19 1446 12/16/19 2020 12/17/19 0501  BP: 140/78 126/70 111/76 114/80  Pulse: (!) 104 97 (!) 101 90  Resp:   18 16  Temp: 98.6 F (37 C) 99.2 F (37.3 C) 98.3 F (36.8 C) 98.3 F (36.8 C)  TempSrc: Oral Oral  Oral  SpO2: 99% 97% 99% 100%  Weight:      Height:       No intake or output data in the 24 hours ending 12/17/19 1200 Filed Weights   12/11/19 2124 12/12/19 0411  Weight: 90.7 kg 87.2 kg     Data Reviewed:   CBC: Recent Labs  Lab  12/11/19 2202 12/11/19 2202 12/12/19 0552 12/13/19 0617 12/14/19 0726 12/15/19 0610 12/16/19 0636  WBC 12.4*   < > 24.4* 15.5* 10.5 9.5 8.8  NEUTROABS  11.0*  --   --  12.0* 6.6 6.2 5.6  HGB 12.7   < > 13.4 11.2* 10.7* 10.3* 10.7*  HCT 35.6*   < > 39.7 34.3* 31.5* 30.8* 33.3*  MCV 91.0   < > 94.5 96.9 94.0 94.8 98.8  PLT 402*   < > 355 292 251 272 310   < > = values in this interval not displayed.   Basic Metabolic Panel: Recent Labs  Lab 12/13/19 0617 12/14/19 0726 12/15/19 0610 12/16/19 0636 12/17/19 0623  NA 136 137 134* 135 136  K 3.7 4.4 4.8 3.9 5.0  CL 106 111 109 108 107  CO2 18* 15* 16* 19* 19*  GLUCOSE 152* 138* 151* 154* 159*  BUN 20 20 24* 20 19  CREATININE 1.52* 1.27* 1.43* 1.20* 1.22*  CALCIUM 8.1* 8.2* 8.4* 8.9 8.8*   GFR: Estimated Creatinine Clearance: 54 mL/min (A) (by C-G formula based on SCr of 1.22 mg/dL (H)). Liver Function Tests: Recent Labs  Lab 12/11/19 2157 12/14/19 0726 12/15/19 0610  AST 27 23 23   ALT 24 18 16   ALKPHOS 123 73 74  BILITOT 0.7 0.8 0.9  PROT 8.6* 6.1* 5.9*  ALBUMIN 4.7 3.3* 3.1*   Recent Labs  Lab 12/11/19 2157  LIPASE 37   No results for input(s): AMMONIA in the last 168 hours. Coagulation Profile: Recent Labs  Lab 12/11/19 2202  INR 1.0   Cardiac Enzymes: No results for input(s): CKTOTAL, CKMB, CKMBINDEX, TROPONINI in the last 168 hours. BNP (last 3 results) No results for input(s): PROBNP in the last 8760 hours. HbA1C: No results for input(s): HGBA1C in the last 72 hours. CBG: Recent Labs  Lab 12/16/19 1152 12/16/19 1648 12/16/19 2023 12/17/19 0509 12/17/19 0731  GLUCAP 217* 174* 185* 120* 141*   Lipid Profile: No results for input(s): CHOL, HDL, LDLCALC, TRIG, CHOLHDL, LDLDIRECT in the last 72 hours. Thyroid Function Tests: No results for input(s): TSH, T4TOTAL, FREET4, T3FREE, THYROIDAB in the last 72 hours. Anemia Panel: No results for input(s): VITAMINB12, FOLATE, FERRITIN, TIBC, IRON, RETICCTPCT in the last 72 hours. Sepsis Labs: Recent Labs  Lab 12/11/19 2202 12/12/19 0003 12/12/19 0552 12/12/19 0825  LATICACIDVEN 2.7* 3.1* 2.0* 2.1*     Recent Results (from the past 240 hour(s))  Culture, blood (Routine x 2)     Status: None   Collection Time: 12/11/19  9:55 PM   Specimen: BLOOD  Result Value Ref Range Status   Specimen Description   Final    BLOOD RIGHT ANTECUBITAL Performed at [facility], [address] [place], [place] [address]    Special Requests   Final    BOTTLES DRAWN AEROBIC AND ANAEROBIC Blood Culture adequate volume Performed at [facility], [address] [place], [place] [address]    Culture   Final    NO GROWTH 5 DAYS Performed at [facility], [place] [address], [place], [place] [address]    Report Status 12/17/2019 FINAL  Final  Respiratory Panel by RT PCR (Flu A&B, Covid) - Nasopharyngeal Swab     Status: None   Collection Time: 12/12/19 12:06 AM   Specimen: Nasopharyngeal Swab  Result Value Ref Range Status   SARS Coronavirus 2 by RT PCR NEGATIVE NEGATIVE Final    Comment: (NOTE) SARS-CoV-2 target nucleic  acids are NOT DETECTED. The SARS-CoV-2 RNA is generally detectable in upper respiratoy specimens during the acute phase of infection. The lowest concentration of SARS-CoV-2 viral copies this assay can detect is 131 copies/mL. A negative result does not preclude SARS-Cov-2 infection and should not be used as the sole basis for treatment or other patient management decisions. A negative result may occur with  improper specimen collection/handling, submission of specimen other than nasopharyngeal swab, presence of viral mutation(s) within the areas targeted by this assay, and inadequate number of viral copies (<131 copies/mL). A negative result must be combined with clinical observations, patient history, and epidemiological information. The expected result is Negative. Fact Sheet for Patients:  PinkCheek.be Fact Sheet for Healthcare Providers:  GravelBags.it This test is not yet ap proved or cleared  by the [place] FDA and  has been authorized for detection and/or diagnosis of SARS-CoV-2 by FDA under an Emergency Use Authorization (EUA). This EUA will remain  in effect (meaning this test can be used) for the duration of the COVID-19 declaration under Section 564(b)(1) of the Act, 21 U.S.C. section 360bbb-3(b)(1), unless the authorization is terminated or revoked sooner.    Influenza A by PCR NEGATIVE NEGATIVE Final   Influenza B by PCR NEGATIVE NEGATIVE Final    Comment: (NOTE) The Xpert Xpress SARS-CoV-2/FLU/RSV assay is intended as an aid in  the diagnosis of influenza from Nasopharyngeal swab specimens and  should not be used as a sole basis for treatment. Nasal washings and  aspirates are unacceptable for Xpert Xpress SARS-CoV-2/FLU/RSV  testing. Fact Sheet for Patients: PinkCheek.be Fact Sheet for Healthcare Providers: GravelBags.it This test is not yet approved or cleared by the [place] FDA and  has been authorized for detection and/or diagnosis of SARS-CoV-2 by  FDA under an Emergency Use Authorization (EUA). This EUA will remain  in effect (meaning this test can be used) for the duration of the  Covid-19 declaration under Section 564(b)(1) of the Act, 21  U.S.C. section 360bbb-3(b)(1), unless the authorization is  terminated or revoked. Performed at [facility], [address] [place], [place] [address]   MRSA PCR Screening     Status: None   Collection Time: 12/12/19  3:33 AM   Specimen: Nasopharyngeal  Result Value Ref Range Status   MRSA by PCR NEGATIVE NEGATIVE Final    Comment:        The GeneXpert MRSA Assay (FDA approved for NASAL specimens only), is one component of a comprehensive MRSA colonization surveillance program. It is not intended to diagnose MRSA infection nor to guide or monitor treatment for MRSA infections. Performed at [facility], [place]  [address] [place], [place] [address]   Culture, blood (routine x 2)     Status: None (Preliminary result)   Collection Time: 12/13/19  3:14 PM   Specimen: BLOOD  Result Value Ref Range Status   Specimen Description   Final    BLOOD RIGHT HAND Performed at [facility] [address], [place], [place] [address]    Special Requests   Final    BOTTLES DRAWN AEROBIC ONLY Blood Culture adequate volume Performed at [facility] [address], [place], [place] [address]    Culture   Final    NO GROWTH 4 DAYS Performed at [facility], [place] [address], [place],  [address]    Report Status PENDING  Incomplete  Culture, blood (routine x 2)  Status: None (Preliminary result)   Collection Time: 12/13/19  3:14 PM   Specimen: BLOOD  Result Value Ref Range Status   Specimen Description   Final    BLOOD LEFT ARM Performed at [facility] [address], [place], [place] [address]    Special Requests   Final    BOTTLES DRAWN AEROBIC ONLY Blood Culture adequate volume Performed at [facility] [address], [place], [place] [address]    Culture   Final    NO GROWTH 4 DAYS Performed at [facility], [place] [address] [place], [place] [address]    Report Status PENDING  Incomplete         Radiology Studies: ECHOCARDIOGRAM COMPLETE  Result Date: 12/15/2019    ECHOCARDIOGRAM REPORT   Patient Name:   BRAYLAH BRANDLI Date of Exam: 12/15/2019 Medical Rec #:  [id number]        Height:       65.5 in Accession #:    [id number]       Weight:       192.2 lb Date of Birth:  1960-11-21        BSA:          1.956 m Patient Age:    14 years         BP:           140/78 mmHg Patient Gender: F                HR:           108 bpm. Exam Location:  Inpatient Procedure: 2D Echo Indications:    786.09 dyspnea  History:        Patient has prior history of Echocardiogram examinations, most                 recent  09/03/2019. Risk Factors:Diabetes, Hypertension and Former                 Smoker. Cancer.  Sonographer:    Jannett Celestine RDCS (AE) Referring Phys: [id number] CHRISTOPHER N SPONGBERG  Sonographer Comments: off axis apical window. exam performed supine IMPRESSIONS  1. Left ventricular ejection fraction, by estimation, is 70 to 75%. The left ventricle has hyperdynamic function. The left ventricle has no regional wall motion abnormalities. There is mild left ventricular hypertrophy. Left ventricular diastolic parameters are consistent with Grade I diastolic dysfunction (impaired relaxation).  2. Right ventricular systolic function is normal. The right ventricular size is normal.  3. The mitral valve is normal in structure. No evidence of mitral valve regurgitation. No evidence of mitral stenosis.  4. The aortic valve is normal in structure. Aortic valve regurgitation is not visualized. No aortic stenosis is present.  5. The inferior vena cava is normal in size with greater than 50% respiratory variability, suggesting right atrial pressure of 3 mmHg. FINDINGS  Left Ventricle: Left ventricular ejection fraction, by estimation, is 70 to 75%. The left ventricle has hyperdynamic function. The left ventricle has no regional wall motion abnormalities. The left ventricular internal cavity size was normal in size. There is mild left ventricular hypertrophy. Left ventricular diastolic parameters are consistent with Grade I diastolic dysfunction (impaired relaxation). Right Ventricle: The right ventricular size is normal. No increase in right ventricular wall thickness. Right ventricular systolic function is normal. Left Atrium: Left atrial size was normal in size. Right Atrium: Right atrial size was normal in size. Pericardium: There is no evidence of pericardial effusion.  Mitral Valve: The mitral valve is normal in structure. Normal mobility of the mitral valve leaflets. No evidence of mitral valve regurgitation. No evidence of  mitral valve stenosis. Tricuspid Valve: The tricuspid valve is normal in structure. Tricuspid valve regurgitation is trivial. No evidence of tricuspid stenosis. Aortic Valve: The aortic valve is normal in structure. Aortic valve regurgitation is not visualized. No aortic stenosis is present. Pulmonic Valve: The pulmonic valve was normal in structure. Pulmonic valve regurgitation is not visualized. No evidence of pulmonic stenosis. Aorta: The aortic root is normal in size and structure. Venous: The inferior vena cava is normal in size with greater than 50% respiratory variability, suggesting right atrial pressure of 3 mmHg. IAS/Shunts: No atrial level shunt detected by color flow Doppler.  LEFT VENTRICLE PLAX 2D LVIDd:         3.30 cm LVIDs:         2.20 cm LV PW:         1.30 cm LV IVS:        1.40 cm LVOT diam:     2.20 cm LV SV:         62 LV SV Index:   32 LVOT Area:     3.80 cm  LEFT ATRIUM         Index LA diam:    3.30 cm 1.69 cm/m  AORTIC VALVE LVOT Vmax:   108.00 cm/s LVOT Vmean:  73.400 cm/s LVOT VTI:    0.164 m  AORTA Ao Root diam: 3.30 cm  SHUNTS Systemic VTI:  0.16 m Systemic Diam: 2.20 cm Candee Furbish MD Electronically signed by Candee Furbish MD Signature Date/Time: 12/15/2019/3:07:01 PM    Final         Scheduled Meds: . amLODipine  10 mg Oral Daily  . atorvastatin  80 mg Oral q1800  . Chlorhexidine Gluconate Cloth  6 each Topical Daily  . docusate sodium  100 mg Oral BID  . enoxaparin (LOVENOX) injection  40 mg Subcutaneous Daily  . insulin aspart  0-9 Units Subcutaneous Q4H  . insulin glargine  5 Units Subcutaneous QHS  . lisinopril  20 mg Oral Daily  . metoCLOPramide  5 mg Oral TID AC & HS  . metoprolol tartrate  12.5 mg Oral BID  . milk and molasses  1 enema Rectal Once  . morphine  30 mg Oral Q12H  . pantoprazole  40 mg Oral BID  . polyethylene glycol  17 g Oral BID  . QUEtiapine  200 mg Oral Daily  . QUEtiapine  400 mg Oral QHS  . saccharomyces boulardii  250 mg Oral BID  .  senna  2 tablet Oral QHS  . sodium bicarbonate  650 mg Oral BID   Continuous Infusions:   LOS: 5 days   Time spent= 35 mins    Michalene Debruler Arsenio Loader, MD [facility]  If 7PM-7AM, please contact night-coverage  12/17/2019, 12:00 PM

## 2019-12-17 NOTE — Progress Notes (Signed)
Nutrition Brief Education Note  RD consulted for education regarding constipation.   RD working remotely and spoke with patient via phone this afternoon. She reports that she was currently eating at the time of call. Patient reports eating a cheeseburger this afternoon for lunch and endorses some nausea after eating. Patient reports regular meal intake (2-3 meals/day) and stated that she drinks a lot of water at home. RD attempted to provide education, patient stated "I am not in the best of moods right now because I am having all this pain" and requested education at a later time.  RD will be on campus tomorrow, if time allows will see patient in room for education.   No further nutrition interventions warranted at this time. If nutrition issues arise, please re-consult RD.   Lajuan Lines, RD, LDN Clinical Nutrition After Hours/Weekend Pager # in [phone]

## 2019-12-18 LAB — CBC
HCT: 31.2 % — ABNORMAL LOW (ref 36.0–46.0)
Hemoglobin: 10.2 g/dL — ABNORMAL LOW (ref 12.0–15.0)
MCH: 32 pg (ref 26.0–34.0)
MCHC: 32.7 g/dL (ref 30.0–36.0)
MCV: 97.8 fL (ref 80.0–100.0)
Platelets: 303 10*3/uL (ref 150–400)
RBC: 3.19 MIL/uL — ABNORMAL LOW (ref 3.87–5.11)
RDW: 13.4 % (ref 11.5–15.5)
WBC: 8 10*3/uL (ref 4.0–10.5)
nRBC: 0 % (ref 0.0–0.2)

## 2019-12-18 LAB — BASIC METABOLIC PANEL
Anion gap: 7 (ref 5–15)
BUN: 21 mg/dL — ABNORMAL HIGH (ref 6–20)
CO2: 22 mmol/L (ref 22–32)
Calcium: 8.7 mg/dL — ABNORMAL LOW (ref 8.9–10.3)
Chloride: 108 mmol/L (ref 98–111)
Creatinine, Ser: 1.06 mg/dL — ABNORMAL HIGH (ref 0.44–1.00)
GFR calc Af Amer: >60 mL/min (ref >60)
GFR calc non Af Amer: 57 mL/min — ABNORMAL LOW (ref >60)
Glucose, Bld: 156 mg/dL — ABNORMAL HIGH (ref 70–99)
Potassium: 4.6 mmol/L (ref 3.5–5.1)
Sodium: 137 mmol/L (ref 135–145)

## 2019-12-18 LAB — CULTURE, BLOOD (ROUTINE X 2)
Culture: NO GROWTH
Culture: NO GROWTH
Special Requests: ADEQUATE
Special Requests: ADEQUATE

## 2019-12-18 LAB — GLUCOSE, CAPILLARY
Glucose-Capillary: 181 mg/dL — ABNORMAL HIGH (ref 70–99)
Glucose-Capillary: 165 mg/dL — ABNORMAL HIGH (ref 70–99)

## 2019-12-18 LAB — MAGNESIUM: Magnesium: 1.4 mg/dL — ABNORMAL LOW (ref 1.7–2.4)

## 2019-12-18 MED ORDER — PANTOPRAZOLE SODIUM 40 MG PO TBEC: 40.0000 mg | DELAYED_RELEASE_TABLET | Freq: Two times a day (BID) | ORAL | 0 refills | Status: AC

## 2019-12-18 MED ORDER — MAGNESIUM SULFATE 2 GM/50ML IV SOLN
2.0000 g | Freq: Once | INTRAVENOUS | Status: AC
  Administered 2019-12-18: 2 g via INTRAVENOUS
  Filled 2019-12-18: qty 50

## 2019-12-18 MED ORDER — SACCHAROMYCES BOULARDII 250 MG PO CAPS: 250.0000 mg | ORAL_CAPSULE | Freq: Two times a day (BID) | ORAL | 0 refills | Status: AC

## 2019-12-18 MED ORDER — DOCUSATE SODIUM 100 MG PO CAPS: 100.0000 mg | ORAL_CAPSULE | Freq: Two times a day (BID) | ORAL | 0 refills | Status: DC

## 2019-12-18 MED ORDER — METOPROLOL TARTRATE 25 MG PO TABS: 12.5000 mg | ORAL_TABLET | Freq: Two times a day (BID) | ORAL | 0 refills | Status: AC

## 2019-12-18 MED ORDER — METOCLOPRAMIDE HCL 5 MG PO TABS: 5.0000 mg | ORAL_TABLET | Freq: Three times a day (TID) | ORAL | 0 refills | Status: AC

## 2019-12-18 NOTE — Progress Notes (Signed)
Discharge instructions given with stated understanding 

## 2019-12-18 NOTE — Discharge Summary (Signed)
Physician Discharge Summary  Amy Nunez [id number] DOB: 03/31/1961 DOA: 12/11/2019  PCP: Einar Pheasant, DO  Admit date: 12/11/2019 Discharge date: 12/18/2019  Admitted From: Home Disposition: Home  Recommendations for Outpatient Follow-up:  1. Follow up with PCP in 1-2 weeks 2. Please obtain BMP/CBC in one week your next doctors visit.  3. Started Reglan, PPI twice daily, stool softener as needed 4. Metoprolol 12.5 mg twice daily for sinus tachycardia 5. Florastor twice daily for the next few days 6. Advised to discontinue narcotic use 7. Has follow-up appointment with GI in 1 week    Discharge Condition: Stable CODE STATUS: Full code Diet recommendation: Heart healthy  Brief/Interim Summary: 59 year old with history of DM2, HTN, renal cell cancer status post nephrectomy in February, status post appendectomy presented from [place] with complaints of nausea vomiting and diarrhea.  Unknown exact etiology, CT of the abdomen pelvis is negative.  Patient was considered to manage during the hospitalization.  There is concerns for possible constipation therefore bowel regimen was given, PPI and Florastor.  Started on beta-blocker due to sinus tachycardia which improved her heart rate.  On the day of discharge she was feeling much better.  She was advised to follow-up outpatient with PCP and gastroenterology.   Assessment & Plan:    Abdominal pain/nausea and vomiting, resolved -No acute pathology seen on CT.  Concerns of possible constipation secondary to narcotic use from arthritis -Bowel regimen prescribed upon discharge -Florastor twice daily -Diet as tolerated  Sinus tachycardia -Resolved.  Now on metoprolol twice daily  Essential hypertension -Norvasc, lisinopril.  Diabetes mellitus type 2 -Resume home medications  Pulmonary edema secondary to congestive heart failure with preserved ejection fraction, [id number], grade 1 diastolic  dysfunction -Appears euvolemic  CKD stage IIIb Metabolic acidosis, resolved -Renal function now baseline of 1.06   Discharge Diagnoses:  Principal Problem:   SIRS (systemic inflammatory response syndrome) (HCC) Active Problems:   Abdominal pain   Type 2 diabetes mellitus (HCC)   Benign essential HTN   Nausea vomiting and diarrhea   Intractable nausea and vomiting    Consultations:  None  Subjective: Feels okay no complaints.  Feels slightly better than yesterday tolerating orals  Discharge Exam: Vitals:   12/17/19 2021 12/18/19 0524  BP: 111/73 108/67  Pulse: 89 79  Resp: 18 16  Temp: 98.8 F (37.1 C) 98.2 F (36.8 C)  SpO2: 98% 99%   Vitals:   12/17/19 0501 12/17/19 1422 12/17/19 2021 12/18/19 0524  BP: 114/80 116/77 111/73 108/67  Pulse: 90 89 89 79  Resp: 16 18 18 16   Temp: 98.3 F (36.8 C) 98.3 F (36.8 C) 98.8 F (37.1 C) 98.2 F (36.8 C)  TempSrc: Oral Oral Oral Oral  SpO2: 100% 98% 98% 99%  Weight:      Height:        General: Pt is alert, awake, not in acute distress Cardiovascular: RRR, S1/S2 +, no rubs, no gallops Respiratory: CTA bilaterally, no wheezing, no rhonchi Abdominal: Soft, NT, ND, bowel sounds + Extremities: no edema, no cyanosis  Discharge Instructions   Allergies as of 12/18/2019      Reactions   Penicillins Anaphylaxis, Rash   Other reaction(s): Unknown   Ciprofloxacin Hives   Ibuprofen Nausea And Vomiting   Red Dye Diarrhea   Other reaction(s): Unknown      Medication List    STOP taking these medications   sulfamethoxazole-trimethoprim 800-160 MG tablet Commonly known as: BACTRIM DS  TAKE these medications   acetaminophen 500 MG tablet Commonly known as: TYLENOL Take 2 tablets (1,000 mg total) by mouth every 6 (six) hours.   amLODipine 10 MG tablet Commonly known as: NORVASC Take 10 mg by mouth daily. What changed: Another medication with the same name was removed. Continue taking this medication, and  follow the directions you see here.   atorvastatin 80 MG tablet Commonly known as: LIPITOR Take 80 mg by mouth daily.   docusate sodium 100 MG capsule Commonly known as: COLACE Take 1 capsule (100 mg total) by mouth 2 (two) times daily.   gabapentin 800 MG tablet Commonly known as: NEURONTIN Take 1 tablet (800 mg total) by mouth 2 (two) times daily. What changed: when to take this   hydrOXYzine 25 MG tablet Commonly known as: ATARAX/VISTARIL Take 25 mg by mouth every 6 (six) hours as needed for anxiety.   Lantus SoloStar 100 UNIT/ML Solostar Pen Generic drug: insulin glargine Inject 5 Units into the skin at bedtime.   lisinopril 40 MG tablet Commonly known as: ZESTRIL Take 0.5 tablets (20 mg total) by mouth daily.   metoCLOPramide 5 MG tablet Commonly known as: REGLAN Take 1 tablet (5 mg total) by mouth 4 (four) times daily -  before meals and at bedtime for 15 days.   metoprolol tartrate 25 MG tablet Commonly known as: LOPRESSOR Take 0.5 tablets (12.5 mg total) by mouth 2 (two) times daily.   metroNIDAZOLE 500 MG tablet Commonly known as: FLAGYL Take 500 mg by mouth every 6 (six) hours. 7 day supply   morphine 30 MG 12 hr tablet Commonly known as: MS CONTIN Take 30 mg by mouth every 12 (twelve) hours.   ondansetron 4 MG tablet Commonly known as: Zofran Take 1 tablet (4 mg total) by mouth daily as needed for nausea or vomiting.   oxyCODONE-acetaminophen 10-325 MG tablet Commonly known as: PERCOCET Take 1 tablet by mouth every 6 (six) hours.   pantoprazole 40 MG tablet Commonly known as: PROTONIX Take 1 tablet (40 mg total) by mouth 2 (two) times daily before a meal.   polyethylene glycol 17 g packet Commonly known as: MIRALAX / GLYCOLAX Take 17 g by mouth 2 (two) times daily. What changed:   when to take this  reasons to take this   promethazine 25 MG tablet Commonly known as: PHENERGAN Take 25 mg by mouth every 8 (eight) hours as needed for nausea or  vomiting.   QUEtiapine 200 MG tablet Commonly known as: SEROQUEL Take 1-2 tablets by mouth See admin instructions. Take 1 tablet in the morning and 2 tablets at night.   QUEtiapine 50 MG Tb24 24 hr tablet Commonly known as: SEROQUEL XR Take 50 mg by mouth at bedtime.   saccharomyces boulardii 250 MG capsule Commonly known as: FLORASTOR Take 1 capsule (250 mg total) by mouth 2 (two) times daily.   tiZANidine 4 MG tablet Commonly known as: ZANAFLEX Take 4 mg by mouth every 8 (eight) hours as needed for muscle spasms.   traZODone 50 MG tablet Commonly known as: DESYREL Take 50 mg by mouth at bedtime as needed for sleep.      Follow-up Information    Einar Pheasant, DO. Schedule an appointment as soon as possible for a visit in 2 week(s).   Specialty: Family Medicine Contact information: [address] Kristeen Mans [address] [place] [address] [phone]          Allergies  Allergen Reactions  . Penicillins Anaphylaxis  and Rash    Other reaction(s): Unknown  . Ciprofloxacin Hives  . Ibuprofen Nausea And Vomiting  . Red Dye Diarrhea    Other reaction(s): Unknown    You were cared for by a hospitalist during your hospital stay. If you have any questions about your discharge medications or the care you received while you were in the hospital after you are discharged, you can call the unit and asked to speak with the hospitalist on call if the hospitalist that took care of you is not available. Once you are discharged, your primary care physician will handle any further medical issues. Please note that no refills for any discharge medications will be authorized once you are discharged, as it is imperative that you return to your primary care physician (or establish a relationship with a primary care physician if you do not have one) for your aftercare needs so that they can reassess your need for medications and monitor your lab values.   Procedures/Studies: DG Chest 2  View  Result Date: 12/11/2019 CLINICAL DATA:  Sepsis, nausea/vomiting/diarrhea, fever EXAM: CHEST - 2 VIEW COMPARISON:  09/08/2019 FINDINGS: Frontal and lateral views of the chest demonstrate a stable cardiac silhouette. No airspace disease, effusion, or pneumothorax. Postsurgical changes from right nephrectomy. No acute bony abnormalities. IMPRESSION: 1. No acute intrathoracic process. Electronically Signed   By: Randa Ngo M.D.   On: 12/11/2019 22:26   CT Abdomen Pelvis W Contrast  Result Date: 12/11/2019 CLINICAL DATA:  Nausea, vomiting, diarrhea for 1 day. History of right renal cell carcinoma status post nephrectomy EXAM: CT ABDOMEN AND PELVIS WITH CONTRAST TECHNIQUE: Multidetector CT imaging of the abdomen and pelvis was performed using the standard protocol following bolus administration of intravenous contrast. CONTRAST:  61mL OMNIPAQUE IOHEXOL 300 MG/ML  SOLN COMPARISON:  11/03/2019 FINDINGS: Lower chest: Visualized portions of the lung bases are clear. Hepatobiliary: No focal liver abnormality is seen. No gallstones, gallbladder wall thickening, or biliary dilatation. Pancreas: Unremarkable. No pancreatic ductal dilatation or surrounding inflammatory changes. Spleen: Normal in size without focal abnormality. Adrenals/Urinary Tract: Postsurgical changes from right nephrectomy. Compensatory hypertrophy of the left kidney. Stable left renal cyst. No obstructive uropathy. The bladder is unremarkable. The adrenals are normal. Stomach/Bowel: No bowel obstruction or ileus. Minimal diverticulosis of the descending colon without diverticulitis. The appendix is surgically absent. No bowel wall thickening or inflammatory change. There is a small hiatal hernia. Vascular/Lymphatic: Aortic atherosclerosis. No enlarged abdominal or pelvic lymph nodes. Reproductive: Status post hysterectomy. No adnexal masses. Other: No abdominal wall hernia or abnormality. No abdominopelvic ascites. Musculoskeletal: No acute or  destructive bony lesions. Reconstructed images demonstrate no additional findings. IMPRESSION: 1. No acute intra-abdominal or intrapelvic process. 2. Stable postsurgical changes from right nephrectomy. 3. Stable hiatal hernia. 4.  Aortic Atherosclerosis (ICD10-I70.0). Electronically Signed   By: Randa Ngo M.D.   On: 12/11/2019 23:17   DG CHEST PORT 1 VIEW  Result Date: 12/14/2019 CLINICAL DATA:  Shortness of breath EXAM: PORTABLE CHEST 1 VIEW COMPARISON:  Radiograph 12/11/2019 FINDINGS: Lung volumes are low. There is diffuse mild hazy opacity with some cephalization of the pulmonary vascularity and peripheral septal lines with fissural thickening. No pneumothorax or visible effusion. The cardiac silhouette appears perhaps mildly enlarged from comparison prior exams. There is tortuosity of the brachiocephalic vessels contributing to opacity along the right upper mediastinal border. The aorta is calcified. The remaining cardiomediastinal contours are unremarkable. Telemetry leads overlie the chest. No acute osseous or soft tissue abnormality. Degenerative changes are  present in the imaged spine and shoulders. Cholecystectomy clips present in the right upper quadrant. IMPRESSION: 1. Features suggesting CHF/volume overload with pulmonary edema and perhaps mild cardiac enlargement from comparison exams. 2. Tortuosity of the brachiocephalic vessels contributing to opacity along the right upper mediastinal border. 3.  Aortic Atherosclerosis (ICD10-I70.0). Electronically Signed   By: Lovena Le M.D.   On: 12/14/2019 04:30   ECHOCARDIOGRAM COMPLETE  Result Date: 12/15/2019    ECHOCARDIOGRAM REPORT   Patient Name:   Amy Nunez Date of Exam: 12/15/2019 Medical Rec #:  [id number]        Height:       65.5 in Accession #:    [id number]       Weight:       192.2 lb Date of Birth:  06-20-1961        BSA:          1.956 m Patient Age:    59 years         BP:           140/78 mmHg Patient Gender: F                 HR:           108 bpm. Exam Location:  Inpatient Procedure: 2D Echo Indications:    786.09 dyspnea  History:        Patient has prior history of Echocardiogram examinations, most                 recent 09/03/2019. Risk Factors:Diabetes, Hypertension and Former                 Smoker. Cancer.  Sonographer:    Jannett Celestine RDCS (AE) Referring Phys: [phone] CHRISTOPHER N SPONGBERG  Sonographer Comments: off axis apical window. exam performed supine IMPRESSIONS  1. Left ventricular ejection fraction, by estimation, is 70 to 75%. The left ventricle has hyperdynamic function. The left ventricle has no regional wall motion abnormalities. There is mild left ventricular hypertrophy. Left ventricular diastolic parameters are consistent with Grade I diastolic dysfunction (impaired relaxation).  2. Right ventricular systolic function is normal. The right ventricular size is normal.  3. The mitral valve is normal in structure. No evidence of mitral valve regurgitation. No evidence of mitral stenosis.  4. The aortic valve is normal in structure. Aortic valve regurgitation is not visualized. No aortic stenosis is present.  5. The inferior vena cava is normal in size with greater than 50% respiratory variability, suggesting right atrial pressure of 3 mmHg. FINDINGS  Left Ventricle: Left ventricular ejection fraction, by estimation, is 70 to 75%. The left ventricle has hyperdynamic function. The left ventricle has no regional wall motion abnormalities. The left ventricular internal cavity size was normal in size. There is mild left ventricular hypertrophy. Left ventricular diastolic parameters are consistent with Grade I diastolic dysfunction (impaired relaxation). Right Ventricle: The right ventricular size is normal. No increase in right ventricular wall thickness. Right ventricular systolic function is normal. Left Atrium: Left atrial size was normal in size. Right Atrium: Right atrial size was normal in size. Pericardium: There is  no evidence of pericardial effusion. Mitral Valve: The mitral valve is normal in structure. Normal mobility of the mitral valve leaflets. No evidence of mitral valve regurgitation. No evidence of mitral valve stenosis. Tricuspid Valve: The tricuspid valve is normal in structure. Tricuspid valve regurgitation is trivial. No evidence of tricuspid stenosis. Aortic Valve: The aortic valve is normal in structure.  Aortic valve regurgitation is not visualized. No aortic stenosis is present. Pulmonic Valve: The pulmonic valve was normal in structure. Pulmonic valve regurgitation is not visualized. No evidence of pulmonic stenosis. Aorta: The aortic root is normal in size and structure. Venous: The inferior vena cava is normal in size with greater than 50% respiratory variability, suggesting right atrial pressure of 3 mmHg. IAS/Shunts: No atrial level shunt detected by color flow Doppler.  LEFT VENTRICLE PLAX 2D LVIDd:         3.30 cm LVIDs:         2.20 cm LV PW:         1.30 cm LV IVS:        1.40 cm LVOT diam:     2.20 cm LV SV:         62 LV SV Index:   32 LVOT Area:     3.80 cm  LEFT ATRIUM         Index LA diam:    3.30 cm 1.69 cm/m  AORTIC VALVE LVOT Vmax:   108.00 cm/s LVOT Vmean:  73.400 cm/s LVOT VTI:    0.164 m  AORTA Ao Root diam: 3.30 cm  SHUNTS Systemic VTI:  0.16 m Systemic Diam: 2.20 cm Candee Furbish MD Electronically signed by Candee Furbish MD Signature Date/Time: 12/15/2019/3:07:01 PM    Final       The results of significant diagnostics from this hospitalization (including imaging, microbiology, ancillary and laboratory) are listed below for reference.     Microbiology: Recent Results (from the past 240 hour(s))  Culture, blood (Routine x 2)     Status: None   Collection Time: 12/11/19  9:55 PM   Specimen: BLOOD  Result Value Ref Range Status   Specimen Description   Final    BLOOD RIGHT ANTECUBITAL Performed at [facility] [address] [place], [place] [address]    Special  Requests   Final    BOTTLES DRAWN AEROBIC AND ANAEROBIC Blood Culture adequate volume Performed at [facility], [address] [place], [place] [address]    Culture   Final    NO GROWTH 5 DAYS Performed at [facility], [place] [address] [place], [place] [address]    Report Status 12/17/2019 FINAL  Final  Respiratory Panel by RT PCR (Flu A&B, Covid) - Nasopharyngeal Swab     Status: None   Collection Time: 12/12/19 12:06 AM   Specimen: Nasopharyngeal Swab  Result Value Ref Range Status   SARS Coronavirus 2 by RT PCR NEGATIVE NEGATIVE Final    Comment: (NOTE) SARS-CoV-2 target nucleic acids are NOT DETECTED. The SARS-CoV-2 RNA is generally detectable in upper respiratoy specimens during the acute phase of infection. The lowest concentration of SARS-CoV-2 viral copies this assay can detect is 131 copies/mL. A negative result does not preclude SARS-Cov-2 infection and should not be used as the sole basis for treatment or other patient management decisions. A negative result may occur with  improper specimen collection/handling, submission of specimen other than nasopharyngeal swab, presence of viral mutation(s) within the areas targeted by this assay, and inadequate number of viral copies (<131 copies/mL). A negative result must be combined with clinical observations, patient history, and epidemiological information. The expected result is Negative. Fact Sheet for Patients:  PinkCheek.be Fact Sheet for Healthcare Providers:  GravelBags.it This test is not yet ap proved or cleared by the [place] FDA and  has been authorized for detection  and/or diagnosis of SARS-CoV-2 by FDA under an Emergency Use Authorization (EUA). This EUA will remain  in effect (meaning this test can be used) for the duration of the COVID-19 declaration under Section 564(b)(1) of the Act, 21 U.S.C. section 360bbb-3(b)(1), unless the  authorization is terminated or revoked sooner.    Influenza A by PCR NEGATIVE NEGATIVE Final   Influenza B by PCR NEGATIVE NEGATIVE Final    Comment: (NOTE) The Xpert Xpress SARS-CoV-2/FLU/RSV assay is intended as an aid in  the diagnosis of influenza from Nasopharyngeal swab specimens and  should not be used as a sole basis for treatment. Nasal washings and  aspirates are unacceptable for Xpert Xpress SARS-CoV-2/FLU/RSV  testing. Fact Sheet for Patients: PinkCheek.be Fact Sheet for Healthcare Providers: GravelBags.it This test is not yet approved or cleared by the [place] FDA and  has been authorized for detection and/or diagnosis of SARS-CoV-2 by  FDA under an Emergency Use Authorization (EUA). This EUA will remain  in effect (meaning this test can be used) for the duration of the  Covid-19 declaration under Section 564(b)(1) of the Act, 21  U.S.C. section 360bbb-3(b)(1), unless the authorization is  terminated or revoked. Performed at [facility], [address] [place], [place] [address]   MRSA PCR Screening     Status: None   Collection Time: 12/12/19  3:33 AM   Specimen: Nasopharyngeal  Result Value Ref Range Status   MRSA by PCR NEGATIVE NEGATIVE Final    Comment:        The GeneXpert MRSA Assay (FDA approved for NASAL specimens only), is one component of a comprehensive MRSA colonization surveillance program. It is not intended to diagnose MRSA infection nor to guide or monitor treatment for MRSA infections. Performed at [facility], [place] [address] [place], [place] [address]   Culture, blood (routine x 2)     Status: None   Collection Time: 12/13/19  3:14 PM   Specimen: BLOOD  Result Value Ref Range Status   Specimen Description   Final    BLOOD RIGHT HAND Performed at [facility] [address], [place], [place] [address]    Special Requests    Final    BOTTLES DRAWN AEROBIC ONLY Blood Culture adequate volume Performed at [facility] [address], [place], [place] [address]    Culture   Final    NO GROWTH 5 DAYS Performed at [facility], [place] [address] [place], [place] [address]    Report Status 12/18/2019 FINAL  Final  Culture, blood (routine x 2)     Status: None   Collection Time: 12/13/19  3:14 PM   Specimen: BLOOD  Result Value Ref Range Status   Specimen Description   Final    BLOOD LEFT ARM Performed at [facility] [address] [place], [place] [address]    Special Requests   Final    BOTTLES DRAWN AEROBIC ONLY Blood Culture adequate volume Performed at [facility] [address] [place], [place] [address]    Culture   Final    NO GROWTH 5 DAYS Performed at [facility], [place] [address] [place], [place] [address]    Report Status 12/18/2019 FINAL  Final     Labs: BNP (last 3 results) No results for input(s): BNP in the last 8760 hours. Basic Metabolic Panel: Recent Labs  Lab 12/14/19 0726 12/15/19 [id number] 12/16/19 0636 12/17/19 [id number]  12/18/19 0608  NA 137 134* 135 136 137  K 4.4 4.8 3.9 5.0 4.6  CL 111 109 108 107 108  CO2 15* 16* 19* 19* 22  GLUCOSE 138* 151* 154* 159* 156*  BUN 20 24* 20 19 21*  CREATININE 1.27* 1.43* 1.20* 1.22* 1.06*  CALCIUM 8.2* 8.4* 8.9 8.8* 8.7*  MG  --   --   --   --  1.4*   Liver Function Tests: Recent Labs  Lab 12/11/19 2157 12/14/19 0726 12/15/19 0610  AST 27 23 23   ALT 24 18 16   ALKPHOS 123 73 74  BILITOT 0.7 0.8 0.9  PROT 8.6* 6.1* 5.9*  ALBUMIN 4.7 3.3* 3.1*   Recent Labs  Lab 12/11/19 2157  LIPASE 37   No results for input(s): AMMONIA in the last 168 hours. CBC: Recent Labs  Lab 12/11/19 2202 12/12/19 0552 12/13/19 0617 12/14/19 0726 12/15/19 0610 12/16/19 0636 12/18/19 0608  WBC 12.4*   < > 15.5* 10.5 9.5 8.8 8.0  NEUTROABS 11.0*  --  12.0* 6.6 6.2 5.6  --    HGB 12.7   < > 11.2* 10.7* 10.3* 10.7* 10.2*  HCT 35.6*   < > 34.3* 31.5* 30.8* 33.3* 31.2*  MCV 91.0   < > 96.9 94.0 94.8 98.8 97.8  PLT 402*   < > 292 251 272 310 303   < > = values in this interval not displayed.   Cardiac Enzymes: No results for input(s): CKTOTAL, CKMB, CKMBINDEX, TROPONINI in the last 168 hours. BNP: Invalid input(s): POCBNP CBG: Recent Labs  Lab 12/17/19 1214 12/17/19 1709 12/17/19 2020 12/18/19 0811 12/18/19 1128  GLUCAP 211* 112* 236* 181* 165*   D-Dimer No results for input(s): DDIMER in the last 72 hours. Hgb A1c No results for input(s): HGBA1C in the last 72 hours. Lipid Profile No results for input(s): CHOL, HDL, LDLCALC, TRIG, CHOLHDL, LDLDIRECT in the last 72 hours. Thyroid function studies No results for input(s): TSH, T4TOTAL, T3FREE, THYROIDAB in the last 72 hours.  Invalid input(s): FREET3 Anemia work up No results for input(s): VITAMINB12, FOLATE, FERRITIN, TIBC, IRON, RETICCTPCT in the last 72 hours. Urinalysis    Component Value Date/Time   COLORURINE YELLOW 12/12/2019 [place] 12/12/2019 0057   LABSPEC 1.010 12/12/2019 0057   PHURINE 7.5 12/12/2019 0057   GLUCOSEU 250 (A) 12/12/2019 0057   HGBUR SMALL (A) 12/12/2019 0057   BILIRUBINUR NEGATIVE 12/12/2019 0057   KETONESUR 15 (A) 12/12/2019 0057   PROTEINUR 30 (A) 12/12/2019 0057   NITRITE NEGATIVE 12/12/2019 0057   LEUKOCYTESUR NEGATIVE 12/12/2019 0057   Sepsis Labs Invalid input(s): PROCALCITONIN,  WBC,  LACTICIDVEN Microbiology Recent Results (from the past 240 hour(s))  Culture, blood (Routine x 2)     Status: None   Collection Time: 12/11/19  9:55 PM   Specimen: BLOOD  Result Value Ref Range Status   Specimen Description   Final    BLOOD RIGHT ANTECUBITAL Performed at [facility], [address] [place], [place] [address]    Special Requests   Final    BOTTLES DRAWN AEROBIC AND ANAEROBIC Blood Culture adequate volume Performed at [facility], [address] [place], [place] [address]    Culture   Final    NO GROWTH 5 DAYS Performed at [facility], [place] [address] [place], [place] [address]    Report Status 12/17/2019 FINAL  Final  Respiratory Panel by RT PCR (Flu A&B,  Covid) - Nasopharyngeal Swab     Status: None   Collection Time: 12/12/19 12:06 AM   Specimen: Nasopharyngeal Swab  Result Value Ref Range Status   SARS Coronavirus 2 by RT PCR NEGATIVE NEGATIVE Final    Comment: (NOTE) SARS-CoV-2 target nucleic acids are NOT DETECTED. The SARS-CoV-2 RNA is generally detectable in upper respiratoy specimens during the acute phase of infection. The lowest concentration of SARS-CoV-2 viral copies this assay can detect is 131 copies/mL. A negative result does not preclude SARS-Cov-2 infection and should not be used as the sole basis for treatment or other patient management decisions. A negative result may occur with  improper specimen collection/handling, submission of specimen other than nasopharyngeal swab, presence of viral mutation(s) within the areas targeted by this assay, and inadequate number of viral copies (<131 copies/mL). A negative result must be combined with clinical observations, patient history, and epidemiological information. The expected result is Negative. Fact Sheet for Patients:  PinkCheek.be Fact Sheet for Healthcare Providers:  GravelBags.it This test is not yet ap proved or cleared by the [place] FDA and  has been authorized for detection and/or diagnosis of SARS-CoV-2 by FDA under an Emergency Use Authorization (EUA). This EUA will remain  in effect (meaning this test can be used) for the duration of the COVID-19 declaration under Section 564(b)(1) of the Act, 21 U.S.C. section 360bbb-3(b)(1), unless the authorization is terminated or revoked sooner.    Influenza A by PCR NEGATIVE NEGATIVE Final    Influenza B by PCR NEGATIVE NEGATIVE Final    Comment: (NOTE) The Xpert Xpress SARS-CoV-2/FLU/RSV assay is intended as an aid in  the diagnosis of influenza from Nasopharyngeal swab specimens and  should not be used as a sole basis for treatment. Nasal washings and  aspirates are unacceptable for Xpert Xpress SARS-CoV-2/FLU/RSV  testing. Fact Sheet for Patients: PinkCheek.be Fact Sheet for Healthcare Providers: GravelBags.it This test is not yet approved or cleared by the [place] FDA and  has been authorized for detection and/or diagnosis of SARS-CoV-2 by  FDA under an Emergency Use Authorization (EUA). This EUA will remain  in effect (meaning this test can be used) for the duration of the  Covid-19 declaration under Section 564(b)(1) of the Act, 21  U.S.C. section 360bbb-3(b)(1), unless the authorization is  terminated or revoked. Performed at [facility], [address] [place], [place] [address]   MRSA PCR Screening     Status: None   Collection Time: 12/12/19  3:33 AM   Specimen: Nasopharyngeal  Result Value Ref Range Status   MRSA by PCR NEGATIVE NEGATIVE Final    Comment:        The GeneXpert MRSA Assay (FDA approved for NASAL specimens only), is one component of a comprehensive MRSA colonization surveillance program. It is not intended to diagnose MRSA infection nor to guide or monitor treatment for MRSA infections. Performed at [facility], [place] [address] [place], [place] [address]   Culture, blood (routine x 2)     Status: None   Collection Time: 12/13/19  3:14 PM   Specimen: BLOOD  Result Value Ref Range Status   Specimen Description   Final    BLOOD RIGHT HAND Performed at [facility] [address], [place], [place] [address]    Special Requests   Final    BOTTLES DRAWN AEROBIC ONLY Blood Culture adequate volume Performed at [facility] [address], [place], [place] [address]  Culture   Final    NO GROWTH 5 DAYS Performed at [facility], [place] [address] [place], [place] [address]    Report Status 12/18/2019 FINAL  Final  Culture, blood (routine x 2)     Status: None   Collection Time: 12/13/19  3:14 PM   Specimen: BLOOD  Result Value Ref Range Status   Specimen Description   Final    BLOOD LEFT ARM Performed at [facility] [address], [place], [place] [address]    Special Requests   Final    BOTTLES DRAWN AEROBIC ONLY Blood Culture adequate volume Performed at [place] [facility] [address], [place], [place] [address]    Culture   Final    NO GROWTH 5 DAYS Performed at [facility], [place] [address], [place],  [address]    Report Status 12/18/2019 FINAL  Final     Time coordinating discharge:  I have spent 35 minutes face to face with the patient and on the ward discussing the patients care, assessment, plan and disposition with other care givers. >50% of the time was devoted counseling the patient about the risks and benefits of treatment/Discharge disposition and coordinating care.   SIGNED:   Damita Lack, MD  [facility] 12/18/2019, 12:17 PM   If 7PM-7AM, please contact night-coverage

## 2019-12-18 NOTE — Discharge Instructions (Signed)

## 2019-12-18 NOTE — Plan of Care (Signed)

## 2019-12-18 NOTE — Care Management Important Message (Signed)
Important Message  Patient Details IM Letter given to Roque Lias SW Case Manager to present to the Patient Name: Amy Nunez MRN: [id number] Date of Birth: 1961/03/11   Medicare Important Message Given:  Yes     Kerin Salen 12/18/2019, 11:01 AM

## 2019-12-23 ENCOUNTER — Other Ambulatory Visit: Payer: Medicare HMO

## 2019-12-23 ENCOUNTER — Encounter: Payer: Self-pay | Admitting: Gastroenterology

## 2019-12-23 ENCOUNTER — Ambulatory Visit (INDEPENDENT_AMBULATORY_CARE_PROVIDER_SITE_OTHER): Payer: Medicare HMO | Admitting: Gastroenterology

## 2019-12-23 VITALS — Ht 65.5 in | Wt 202.0 lb

## 2019-12-23 DIAGNOSIS — R197 Diarrhea, unspecified: Secondary | ICD-10-CM

## 2019-12-23 DIAGNOSIS — R112 Nausea with vomiting, unspecified: Secondary | ICD-10-CM

## 2019-12-23 DIAGNOSIS — Z5329 Procedure and treatment not carried out because of patient's decision for other reasons: Secondary | ICD-10-CM

## 2019-12-23 DIAGNOSIS — R634 Abnormal weight loss: Secondary | ICD-10-CM

## 2019-12-30 ENCOUNTER — Ambulatory Visit: Payer: Medicare HMO | Admitting: Gastroenterology

## 2020-01-06 ENCOUNTER — Emergency Department (HOSPITAL_BASED_OUTPATIENT_CLINIC_OR_DEPARTMENT_OTHER): Payer: Medicare HMO

## 2020-01-06 ENCOUNTER — Other Ambulatory Visit: Payer: Self-pay

## 2020-01-06 ENCOUNTER — Inpatient Hospital Stay (HOSPITAL_BASED_OUTPATIENT_CLINIC_OR_DEPARTMENT_OTHER)
Admission: EM | Admit: 2020-01-06 | Discharge: 2020-01-10 | DRG: 315 | Disposition: A | Discharge status: Home / Self Care | Payer: Medicare HMO | Attending: Internal Medicine | Admitting: Internal Medicine

## 2020-01-06 ENCOUNTER — Encounter (HOSPITAL_BASED_OUTPATIENT_CLINIC_OR_DEPARTMENT_OTHER): Payer: Self-pay | Admitting: Emergency Medicine

## 2020-01-06 DIAGNOSIS — G4440 Drug-induced headache, not elsewhere classified, not intractable: Secondary | ICD-10-CM | POA: Diagnosis present

## 2020-01-06 DIAGNOSIS — G939 Disorder of brain, unspecified: Secondary | ICD-10-CM | POA: Diagnosis present

## 2020-01-06 DIAGNOSIS — Z8249 Family history of ischemic heart disease and other diseases of the circulatory system: Secondary | ICD-10-CM

## 2020-01-06 DIAGNOSIS — M7989 Other specified soft tissue disorders: Secondary | ICD-10-CM | POA: Diagnosis not present

## 2020-01-06 DIAGNOSIS — G894 Chronic pain syndrome: Secondary | ICD-10-CM | POA: Diagnosis present

## 2020-01-06 DIAGNOSIS — Z90710 Acquired absence of both cervix and uterus: Secondary | ICD-10-CM

## 2020-01-06 DIAGNOSIS — E875 Hyperkalemia: Secondary | ICD-10-CM | POA: Diagnosis present

## 2020-01-06 DIAGNOSIS — I5032 Chronic diastolic (congestive) heart failure: Secondary | ICD-10-CM | POA: Diagnosis present

## 2020-01-06 DIAGNOSIS — E119 Type 2 diabetes mellitus without complications: Secondary | ICD-10-CM

## 2020-01-06 DIAGNOSIS — R519 Headache, unspecified: Secondary | ICD-10-CM | POA: Diagnosis not present

## 2020-01-06 DIAGNOSIS — E861 Hypovolemia: Secondary | ICD-10-CM | POA: Diagnosis present

## 2020-01-06 DIAGNOSIS — S0101XS Laceration without foreign body of scalp, sequela: Secondary | ICD-10-CM

## 2020-01-06 DIAGNOSIS — W010XXS Fall on same level from slipping, tripping and stumbling without subsequent striking against object, sequela: Secondary | ICD-10-CM | POA: Diagnosis present

## 2020-01-06 DIAGNOSIS — R296 Repeated falls: Secondary | ICD-10-CM | POA: Diagnosis present

## 2020-01-06 DIAGNOSIS — E669 Obesity, unspecified: Secondary | ICD-10-CM | POA: Diagnosis present

## 2020-01-06 DIAGNOSIS — Z79899 Other long term (current) drug therapy: Secondary | ICD-10-CM | POA: Diagnosis not present

## 2020-01-06 DIAGNOSIS — G44209 Tension-type headache, unspecified, not intractable: Secondary | ICD-10-CM | POA: Diagnosis present

## 2020-01-06 DIAGNOSIS — N179 Acute kidney failure, unspecified: Secondary | ICD-10-CM

## 2020-01-06 DIAGNOSIS — I959 Hypotension, unspecified: Secondary | ICD-10-CM | POA: Diagnosis present

## 2020-01-06 DIAGNOSIS — Z20822 Contact with and (suspected) exposure to covid-19: Secondary | ICD-10-CM | POA: Diagnosis present

## 2020-01-06 DIAGNOSIS — C649 Malignant neoplasm of unspecified kidney, except renal pelvis: Secondary | ICD-10-CM | POA: Diagnosis present

## 2020-01-06 DIAGNOSIS — E860 Dehydration: Secondary | ICD-10-CM | POA: Diagnosis present

## 2020-01-06 DIAGNOSIS — E1122 Type 2 diabetes mellitus with diabetic chronic kidney disease: Secondary | ICD-10-CM | POA: Diagnosis present

## 2020-01-06 DIAGNOSIS — F209 Schizophrenia, unspecified: Secondary | ICD-10-CM | POA: Diagnosis present

## 2020-01-06 DIAGNOSIS — Z6832 Body mass index (BMI) 32.0-32.9, adult: Secondary | ICD-10-CM | POA: Diagnosis not present

## 2020-01-06 DIAGNOSIS — E785 Hyperlipidemia, unspecified: Secondary | ICD-10-CM | POA: Diagnosis present

## 2020-01-06 DIAGNOSIS — R7989 Other specified abnormal findings of blood chemistry: Secondary | ICD-10-CM

## 2020-01-06 DIAGNOSIS — Z85528 Personal history of other malignant neoplasm of kidney: Secondary | ICD-10-CM

## 2020-01-06 DIAGNOSIS — I130 Hypertensive heart and chronic kidney disease with heart failure and stage 1 through stage 4 chronic kidney disease, or unspecified chronic kidney disease: Secondary | ICD-10-CM | POA: Diagnosis present

## 2020-01-06 DIAGNOSIS — I10 Essential (primary) hypertension: Secondary | ICD-10-CM | POA: Diagnosis present

## 2020-01-06 DIAGNOSIS — N1831 Chronic kidney disease, stage 3a: Secondary | ICD-10-CM | POA: Diagnosis present

## 2020-01-06 DIAGNOSIS — Z905 Acquired absence of kidney: Secondary | ICD-10-CM | POA: Diagnosis not present

## 2020-01-06 DIAGNOSIS — I9589 Other hypotension: Secondary | ICD-10-CM | POA: Diagnosis not present

## 2020-01-06 DIAGNOSIS — F319 Bipolar disorder, unspecified: Secondary | ICD-10-CM | POA: Diagnosis present

## 2020-01-06 DIAGNOSIS — R609 Edema, unspecified: Secondary | ICD-10-CM | POA: Diagnosis not present

## 2020-01-06 DIAGNOSIS — N189 Chronic kidney disease, unspecified: Secondary | ICD-10-CM | POA: Diagnosis present

## 2020-01-06 DIAGNOSIS — Z79891 Long term (current) use of opiate analgesic: Secondary | ICD-10-CM

## 2020-01-06 HISTORY — DX: Schizophrenia, unspecified: F20.9

## 2020-01-06 HISTORY — DX: Other chronic pain: G89.29

## 2020-01-06 HISTORY — DX: Opioid dependence, uncomplicated: F11.20

## 2020-01-06 HISTORY — DX: Bipolar disorder, unspecified: F31.9

## 2020-01-06 LAB — COMPREHENSIVE METABOLIC PANEL
ALT: 19 U/L (ref 0–44)
AST: 25 U/L (ref 15–41)
Albumin: 3.5 g/dL (ref 3.5–5.0)
Alkaline Phosphatase: 108 U/L (ref 38–126)
Anion gap: 11 (ref 5–15)
BUN: 50 mg/dL — ABNORMAL HIGH (ref 6–20)
CO2: 18 mmol/L — ABNORMAL LOW (ref 22–32)
Calcium: 8.5 mg/dL — ABNORMAL LOW (ref 8.9–10.3)
Chloride: 103 mmol/L (ref 98–111)
Creatinine, Ser: 3.1 mg/dL — ABNORMAL HIGH (ref 0.44–1.00)
GFR calc Af Amer: 18 mL/min — ABNORMAL LOW (ref >60)
GFR calc non Af Amer: 16 mL/min — ABNORMAL LOW (ref >60)
Glucose, Bld: 167 mg/dL — ABNORMAL HIGH (ref 70–99)
Potassium: 4.7 mmol/L (ref 3.5–5.1)
Sodium: 132 mmol/L — ABNORMAL LOW (ref 135–145)
Total Bilirubin: 0.5 mg/dL (ref 0.3–1.2)
Total Protein: 7 g/dL (ref 6.5–8.1)

## 2020-01-06 LAB — SARS CORONAVIRUS 2 BY RT PCR (HOSPITAL ORDER, PERFORMED IN ~~LOC~~ HOSPITAL LAB): SARS Coronavirus 2: NEGATIVE

## 2020-01-06 LAB — CBC WITH DIFFERENTIAL/PLATELET
Abs Immature Granulocytes: 0.03 K/uL (ref 0.00–0.07)
Basophils Absolute: 0 K/uL (ref 0.0–0.1)
Basophils Relative: 0 %
Eosinophils Absolute: 0.3 K/uL (ref 0.0–0.5)
Eosinophils Relative: 3 %
HCT: 27.1 % — ABNORMAL LOW (ref 36.0–46.0)
Hemoglobin: 9.2 g/dL — ABNORMAL LOW (ref 12.0–15.0)
Immature Granulocytes: 0 %
Lymphocytes Relative: 17 %
Lymphs Abs: 1.8 K/uL (ref 0.7–4.0)
MCH: 32.1 pg (ref 26.0–34.0)
MCHC: 33.9 g/dL (ref 30.0–36.0)
MCV: 94.4 fL (ref 80.0–100.0)
Monocytes Absolute: 0.5 K/uL (ref 0.1–1.0)
Monocytes Relative: 5 %
Neutro Abs: 7.9 K/uL — ABNORMAL HIGH (ref 1.7–7.7)
Neutrophils Relative %: 75 %
Platelets: 329 K/uL (ref 150–400)
RBC: 2.87 MIL/uL — ABNORMAL LOW (ref 3.87–5.11)
RDW: 13.5 % (ref 11.5–15.5)
WBC: 10.5 K/uL (ref 4.0–10.5)
nRBC: 0 % (ref 0.0–0.2)

## 2020-01-06 LAB — ETHANOL: Alcohol, Ethyl (B): <10 mg/dL (ref <10)

## 2020-01-06 LAB — PROTIME-INR
INR: 1 (ref 0.8–1.2)
Prothrombin Time: 13.1 seconds (ref 11.4–15.2)

## 2020-01-06 LAB — LACTIC ACID, PLASMA: Lactic Acid, Venous: 1 mmol/L (ref 0.5–1.9)

## 2020-01-06 LAB — APTT: aPTT: 30 s (ref 24–36)

## 2020-01-06 LAB — CBG MONITORING, ED: Glucose-Capillary: 185 mg/dL — ABNORMAL HIGH (ref 70–99)

## 2020-01-06 LAB — D-DIMER, QUANTITATIVE: D-Dimer, Quant: 2.4 ug/mL-FEU — ABNORMAL HIGH (ref 0.00–0.50)

## 2020-01-06 LAB — SALICYLATE LEVEL: Salicylate Lvl: <7 mg/dL — ABNORMAL LOW (ref 7.0–30.0)

## 2020-01-06 LAB — ACETAMINOPHEN LEVEL: Acetaminophen (Tylenol), Serum: 19 ug/mL (ref 10–30)

## 2020-01-06 MED ORDER — HEPARIN BOLUS VIA INFUSION
4000.0000 [IU] | Freq: Once | INTRAVENOUS | Status: AC
  Administered 2020-01-06: 4000 [IU] via INTRAVENOUS

## 2020-01-06 MED ORDER — SODIUM CHLORIDE 0.9 % IV BOLUS (SEPSIS)
1000.0000 mL | Freq: Once | INTRAVENOUS | Status: AC
  Administered 2020-01-06: 1000 mL via INTRAVENOUS

## 2020-01-06 MED ORDER — HEPARIN (PORCINE) 25000 UT/250ML-% IV SOLN
1400.0000 [IU]/h | INTRAVENOUS | Status: DC
  Administered 2020-01-06: 1400 [IU]/h via INTRAVENOUS
  Filled 2020-01-06 (×2): qty 250

## 2020-01-06 MED ORDER — SODIUM CHLORIDE 0.9 % IV SOLN: Freq: Once | INTRAVENOUS | Status: AC

## 2020-01-06 NOTE — Progress Notes (Signed)
[facility] transfer  59 year old female with history of hypertension, type 2 diabetes, stage III CKD, hx of DVT, chronic pain who presents with concerns of dizziness.    Patient reportedly had a fall 2 days ago hitting her head due to dizziness. She was found  notably hypotensive on arrival with systolic of 60 with improved up to 90s after getting 2 L of fluid. MAP above 70. She is now on her third liter.  Takes trazodone, oxycodone, tizanidine and gabapentin but has not taken more than recommended. CBC shows no leukocytosis with stable anemia with hemoglobin 9.2.  Has acute on chronic kidney injury with creatinine of 3.10 with a prior of 1.06.  Has not yet been able to produce urine in the ED.  CT scan of her head, cervical spine, CT renal stone search all negative.  Unclear if her hypotension is secondary to polypharmacy.  However she does have a elevated D-dimer of 2.40 but not sure if that has to do with her AKI.  Could consider stat VQ scan on arrival if there is not any improvement in her blood pressure.  She currently is not tachycardic or hypoxic.  Admit to stepdown at [facility] as stat V/Q scan can only be done there at night if warranted.

## 2020-01-06 NOTE — ED Notes (Signed)
Attempted IV in left AC and left wrist unsuccessful. Dr. Darl Householder made aware of need for [place] IV.

## 2020-01-06 NOTE — ED Triage Notes (Addendum)
B/P low in triage. Pt states took her Morphine 11 hours ago.

## 2020-01-06 NOTE — ED Notes (Signed)
IV infiltrated . ED Provider at bedside.

## 2020-01-06 NOTE — ED Notes (Signed)
ED Provider at bedside. 

## 2020-01-06 NOTE — ED Notes (Signed)
MD in room for [place] iv

## 2020-01-06 NOTE — ED Notes (Signed)
md at bedside for [place] iv

## 2020-01-06 NOTE — ED Notes (Signed)
PT took MS contin 30mg , Gabapentin 800mg  and 1 4mg  zanaflex at 1800 today, PTA. Per her daughter, pt usually takes all 3 and had not had a morphine pill since last eve.

## 2020-01-06 NOTE — ED Triage Notes (Signed)
Multiple complaints. Fell at [employer] 2 days ago. Right wrist pain. Headache.  Pt alert, states "feels foggy" since yesterday. States his head when she fell 2 days ago. No LOC. BS 185 in triage.

## 2020-01-06 NOTE — ED Provider Notes (Signed)
[facility] EMERGENCY DEPARTMENT Provider Note   CSN: [id number] Arrival date & time: 01/06/20  1854     History No chief complaint on file.   Amy Nunez is a 59 y.o. female hx of DM, HTN, chronic pain on opioids, renal cell cancer status post nephrectomy, here presenting with weakness, fall.  Patient was recently admitted for nausea vomiting and dehydration.  Patient states that 2 days ago, she had a mechanical fall at a restaurant.  She did hit her head and injured the right wrist.  She has been feeling persistently weak and dizzy and has persistent headaches.  Also has chronic pain in has been taking her pain medicine as prescribed.  Per the daughter, she sometimes takes more than usual.  She did take some Zanaflex and gabapentin and morphine which were prescribed to her this evening.  She was noted to be hypotensive in triage.  She adamantly denies any fevers or chills or urinary symptoms.  She states that she just feels very dizzy.  The history is provided by the patient.       Past Medical History:  Diagnosis Date  . Bipolar 1 disorder ([place])   . Blood transfusion without reported diagnosis   . Cancer [facility])    renal cancer  . Chronic pain   . Diabetes mellitus without complication ([place])   . Hypertension   . IBS (irritable bowel syndrome)   . Opioid dependence ([place])   . Schizophrenia [facility])     Patient Active Problem List   Diagnosis Date Noted  . SIRS (systemic inflammatory response syndrome) ([place]) 12/12/2019  . Nausea vomiting and diarrhea 12/12/2019  . Intractable nausea and vomiting 12/12/2019  . Appendicitis 11/03/2019  . Acute appendicitis 11/03/2019  . AKI (acute kidney injury) ([place]) 09/19/2019  . Acute kidney injury superimposed on chronic kidney disease ([place]) 09/18/2019  . Abdominal pain 09/03/2019  . Type 2 diabetes mellitus ([place]) 09/03/2019  . Benign essential HTN 09/03/2019  . Irritable bowel syndrome (IBS) 09/03/2019  . Left-sided weakness  09/01/2019    Past Surgical History:  Procedure Laterality Date  . ABDOMINAL HYSTERECTOMY    . ANKLE SURGERY    . COLONOSCOPY  2018   [facility]  . ESOPHAGOGASTRODUODENOSCOPY  10/15/2016   [facility]  . LAPAROSCOPIC APPENDECTOMY N/A 11/04/2019   Procedure: APPENDECTOMY LAPAROSCOPIC;  Surgeon: Coralie Keens, MD;  Location: [facility] ORS;  Service: General;  Laterality: N/A;  . NEPHRECTOMY Right      OB History   No obstetric history on file.     Family History  Problem Relation Age of Onset  . Hypertension Other   . Colon cancer Neg Hx   . Esophageal cancer Neg Hx     Social History   Tobacco Use  . Smoking status: Former Research scientist (life sciences)  . Smokeless tobacco: Never Used  Substance Use Topics  . Alcohol use: Not Currently  . Drug use: Never    Home Medications Prior to Admission medications   Medication Sig Start Date End Date Taking? Authorizing Provider  acetaminophen (TYLENOL) 500 MG tablet Take 2 tablets (1,000 mg total) by mouth every 6 (six) hours. 11/07/19   Saverio Danker, PA-C  amLODipine (NORVASC) 10 MG tablet Take 10 mg by mouth daily. 11/20/19   [provider]  atorvastatin (LIPITOR) 80 MG tablet Take 80 mg by mouth daily.    [provider]  docusate sodium (COLACE) 100 MG capsule Take 1 capsule (100 mg total) by mouth 2 (two) times daily. 12/18/19  Amin, Ankit Chirag, MD  gabapentin (NEURONTIN) 800 MG tablet Take 1 tablet (800 mg total) by mouth 2 (two) times daily. Patient taking differently: Take 800 mg by mouth 3 (three) times daily.  09/04/19   Mariel Aloe, MD  hydrOXYzine (ATARAX/VISTARIL) 25 MG tablet Take 25 mg by mouth every 6 (six) hours as needed for anxiety. 11/20/19   [provider]  LANTUS SOLOSTAR 100 UNIT/ML Solostar Pen Inject 5 Units into the skin at bedtime. 09/20/19   Kayleen Memos, DO  lisinopril (ZESTRIL) 40 MG tablet Take 0.5 tablets (20 mg total) by mouth daily. 11/08/19   Georgette Shell, MD  metoCLOPramide (REGLAN) 5 MG  tablet Take 1 tablet (5 mg total) by mouth 4 (four) times daily -  before meals and at bedtime for 15 days. 12/18/19 01/02/20  Amin, Jeanella Flattery, MD  metoprolol tartrate (LOPRESSOR) 25 MG tablet Take 0.5 tablets (12.5 mg total) by mouth 2 (two) times daily. 12/18/19   Amin, Jeanella Flattery, MD  metroNIDAZOLE (FLAGYL) 500 MG tablet Take 500 mg by mouth every 6 (six) hours. 7 day supply 12/08/19   [provider]  morphine (MS CONTIN) 30 MG 12 hr tablet Take 30 mg by mouth every 12 (twelve) hours. 08/28/19   [provider]  ondansetron (ZOFRAN) 4 MG tablet Take 1 tablet (4 mg total) by mouth daily as needed for nausea or vomiting. 11/08/19 11/07/20  Georgette Shell, MD  oxyCODONE-acetaminophen (PERCOCET) 10-325 MG tablet Take 1 tablet by mouth as needed.  11/24/19   [provider]  pantoprazole (PROTONIX) 40 MG tablet Take 1 tablet (40 mg total) by mouth 2 (two) times daily before a meal. 12/18/19 01/17/20  Amin, Jeanella Flattery, MD  polyethylene glycol (MIRALAX / GLYCOLAX) 17 g packet Take 17 g by mouth 2 (two) times daily. Patient taking differently: Take 17 g by mouth as needed for mild constipation.  11/08/19   Georgette Shell, MD  promethazine (PHENERGAN) 25 MG tablet Take 25 mg by mouth every 8 (eight) hours as needed for nausea or vomiting.     [provider]  QUEtiapine (SEROQUEL XR) 50 MG TB24 24 hr tablet Take 50 mg by mouth at bedtime. 11/20/19   [provider]  saccharomyces boulardii (FLORASTOR) 250 MG capsule Take 1 capsule (250 mg total) by mouth 2 (two) times daily. 12/18/19   Amin, Jeanella Flattery, MD  tiZANidine (ZANAFLEX) 4 MG tablet Take 4 mg by mouth every 8 (eight) hours as needed for muscle spasms.  08/25/19   [provider]  traZODone (DESYREL) 50 MG tablet Take 50 mg by mouth at bedtime as needed for sleep. 11/20/19   [provider]    Allergies    Penicillins, Ciprofloxacin, Ibuprofen, and Red dye  Review of Systems     Review of Systems  Neurological: Positive for dizziness and headaches.  All other systems reviewed and are negative.   Physical Exam Updated Vital Signs BP 94/67   Pulse 82   Temp 98.7 F (37.1 C) (Oral)   Resp 15   Ht 5' 5.5" (1.664 m)   Wt 86.2 kg   SpO2 100%   BMI 31.14 kg/m   Physical Exam Vitals and nursing note reviewed.  Constitutional:      Comments: Chronically ill. Dehydrated   HENT:     Head:     Comments: No obvious scalp hematoma     Nose: Nose normal.     Mouth/Throat:  Mouth: Mucous membranes are dry.  Eyes:     Extraocular Movements: Extraocular movements intact.     Pupils: Pupils are equal, round, and reactive to light.  Cardiovascular:     Rate and Rhythm: Normal rate and regular rhythm.     Pulses: Normal pulses.     Heart sounds: Normal heart sounds.  Pulmonary:     Effort: Pulmonary effort is normal.     Breath sounds: Normal breath sounds.  Abdominal:     General: Abdomen is flat.     Palpations: Abdomen is soft.     Comments: No bruit   Musculoskeletal:        General: Normal range of motion.     Cervical back: Normal range of motion.  Skin:    General: Skin is warm.     Capillary Refill: Capillary refill takes less than 2 seconds.  Neurological:     General: No focal deficit present.  Psychiatric:        Mood and Affect: Mood normal.     ED Results / Procedures / Treatments   Labs (all labs ordered are listed, but only abnormal results are displayed) Labs Reviewed  COMPREHENSIVE METABOLIC PANEL - Abnormal; Notable for the following components:      Result Value   Sodium 132 (*)    CO2 18 (*)    Glucose, Bld 167 (*)    BUN 50 (*)    Creatinine, Ser 3.10 (*)    Calcium 8.5 (*)    GFR calc non Af Amer 16 (*)    GFR calc Af Amer 18 (*)    All other components within normal limits  CBC WITH DIFFERENTIAL/PLATELET - Abnormal; Notable for the following components:   RBC 2.87 (*)    Hemoglobin 9.2 (*)    HCT 27.1 (*)     Neutro Abs 7.9 (*)    All other components within normal limits  SALICYLATE LEVEL - Abnormal; Notable for the following components:   Salicylate Lvl [id number] (*)    All other components within normal limits  D-DIMER, QUANTITATIVE (NOT AT [facility]) - Abnormal; Notable for the following components:   D-Dimer, Quant 2.40 (*)    All other components within normal limits  CBG MONITORING, ED - Abnormal; Notable for the following components:   Glucose-Capillary 185 (*)    All other components within normal limits  CULTURE, BLOOD (ROUTINE X 2)  CULTURE, BLOOD (ROUTINE X 2)  URINE CULTURE  LACTIC ACID, PLASMA  APTT  PROTIME-INR  ETHANOL  ACETAMINOPHEN LEVEL  LACTIC ACID, PLASMA  URINALYSIS, ROUTINE W REFLEX MICROSCOPIC  PREGNANCY, URINE  RAPID URINE DRUG SCREEN, HOSP PERFORMED    EKG EKG Interpretation  Date/Time:  Tuesday Jan 06 2020 19:38:07 EDT Ventricular Rate:  72 PR Interval:    QRS Duration: 85 QT Interval:  382 QTC Calculation: 418 R Axis:   3 Text Interpretation: Sinus rhythm No significant change since last tracing Confirmed by Wandra Arthurs [phone]) on 01/06/2020 7:54:31 PM   Radiology DG Wrist Complete Right  Result Date: 01/06/2020 CLINICAL DATA:  [place], right wrist pain EXAM: RIGHT WRIST - COMPLETE 3+ VIEW COMPARISON:  None. FINDINGS: Frontal, oblique, lateral, and ulnar deviated views of the right wrist are obtained. No fracture, subluxation, or dislocation. Joint spaces are well preserved. Soft tissues are normal. IMPRESSION: 1. Unremarkable right wrist. Electronically Signed   By: Randa Ngo M.D.   On: 01/06/2020 20:08   CT Head Wo Contrast  Result Date: 01/06/2020  CLINICAL DATA:  Status post recent fall. EXAM: CT HEAD WITHOUT CONTRAST TECHNIQUE: Contiguous axial images were obtained from the base of the skull through the vertex without intravenous contrast. COMPARISON:  September 01, 2019. FINDINGS: Brain: There is mild cerebral atrophy with widening of the extra-axial  spaces and ventricular dilatation. There are areas of decreased attenuation within the white matter tracts of the supratentorial brain, consistent with microvascular disease changes. A small chronic right basal ganglia lacunar infarct is seen. Vascular: No hyperdense vessel or unexpected calcification. Skull: Normal. Negative for fracture or focal lesion. Sinuses/Orbits: No acute finding. Other: None. IMPRESSION: 1. Generalized cerebral atrophy. 2. No acute intracranial abnormality. Electronically Signed   By: Virgina Norfolk M.D.   On: 01/06/2020 20:09   CT Cervical Spine Wo Contrast  Result Date: 01/06/2020 CLINICAL DATA:  Status post fall 2 days ago. EXAM: CT CERVICAL SPINE WITHOUT CONTRAST TECHNIQUE: Multidetector CT imaging of the cervical spine was performed without intravenous contrast. Multiplanar CT image reconstructions were also generated. COMPARISON:  September 01, 2019 FINDINGS: Alignment: Normal. Skull base and vertebrae: No acute fracture. No primary bone lesion or focal pathologic process. Soft tissues and spinal canal: No prevertebral fluid or swelling. No visible canal hematoma. Disc levels: There is moderate severity multilevel endplate sclerosis with moderate to marked severity multilevel intervertebral disc space narrowing. Upper chest: Negative. Other: None. IMPRESSION: 1. No acute fracture or dislocation of the cervical spine. 2. Moderate to marked severity multilevel degenerative changes. Electronically Signed   By: Virgina Norfolk M.D.   On: 01/06/2020 20:17   DG Chest Port 1 View  Result Date: 01/06/2020 CLINICAL DATA:  Altered level of consciousness, hypotension, fell EXAM: PORTABLE CHEST 1 VIEW COMPARISON:  12/14/2019 FINDINGS: The heart size and mediastinal contours are within normal limits. Both lungs are clear. The visualized skeletal structures are unremarkable. IMPRESSION: No active disease. Electronically Signed   By: Randa Ngo M.D.   On: 01/06/2020 20:07     Procedures Procedures (including critical care time)  CRITICAL CARE Performed by: Wandra Arthurs   Total critical care time: 30 minutes  Critical care time was exclusive of separately billable procedures and treating other patients.  Critical care was necessary to treat or prevent imminent or life-threatening deterioration.  Critical care was time spent personally by me on the following activities: development of treatment plan with patient and/or surrogate as well as nursing, discussions with consultants, evaluation of patient's response to treatment, examination of patient, obtaining history from patient or surrogate, ordering and performing treatments and interventions, ordering and review of laboratory studies, ordering and review of radiographic studies, pulse oximetry and re-evaluation of patient's condition.  Angiocath insertion Performed by: Wandra Arthurs  Consent: Verbal consent obtained. Risks and benefits: risks, benefits and alternatives were discussed Time out: Immediately prior to procedure a "time out" was called to verify the correct patient, procedure, equipment, support staff and site/side marked as required.  Preparation: Patient was prepped and draped in the usual sterile fashion.  Vein Location: R EJ  Ultrasound Guided  Gauge: 20  Normal blood return and flush without difficulty Patient tolerance: Patient tolerated the procedure well with no immediate complications.     Medications Ordered in ED Medications  sodium chloride 0.9 % bolus 1,000 mL (0 mLs Intravenous Stopped 01/06/20 2044)    And  sodium chloride 0.9 % bolus 1,000 mL (1,000 mLs Intravenous New Bag/Given 01/06/20 2109)    And  sodium chloride 0.9 % bolus 1,000 mL (has no  administration in time range)    ED Course  I have reviewed the triage vital signs and the nursing notes.  Pertinent labs & imaging results that were available during my care of the patient were reviewed by me and  considered in my medical decision making (see chart for details).    MDM Rules/Calculators/A&P                      Amy Nunez is a 59 y.o. female who presented with hypotension, fall.  Etiology of her hypotension is complicated.  Consider drug overdose versus dehydration versus sepsis.  She did fall and hit her head so we will get a CT head and neck as well.  Will get CBC, CMP, tox levels, UA, UDS.  9:27 PM Patient has creatinine of 3 with a BUN of 50.  Patient is difficult IV access and I was able to place a right EJ. She received 2 L bolus and blood pressure went up to the 90s from 60s.  I think her renal failure is likely from hypotension and dehydration.  Her CT renal stone showed no obstruction.  Her CT head and neck were unremarkable.  Will admit for hypotension, renal failure.  Her lactate is normal and she is afebrile and has no source of infection so I held off on antibiotics.  Of note, D-dimer sent due to unexplained hypotension and is elevated.  However given the renal failure, she is unable to get CTA chest.  Also due to the renal failure, I will hold off on Lovenox and will start heparin instead.  Patient can get VQ scan in the hospital if she is persistently hypotensive. Will admit to stepdown.   Final Clinical Impression(s) / ED Diagnoses Final diagnoses:  None    Rx / DC Orders ED Discharge Orders    None       Drenda Freeze, MD 01/06/20 2231

## 2020-01-06 NOTE — ED Notes (Signed)
Patient transported to CT with nurse

## 2020-01-06 NOTE — ED Notes (Signed)
Spoke with pt's daughter who stated her pain medication is locked up at home. She has taken to many pain pills in the past by accident. Pt also takes medications for schizophrenia and bipolar.

## 2020-01-06 NOTE — Progress Notes (Signed)
ANTICOAGULATION CONSULT NOTE - Initial Consult  Pharmacy Consult for heparin Indication: pulmonary embolus  Allergies  Allergen Reactions  . Penicillins Anaphylaxis and Rash    Other reaction(s): Unknown  . Ciprofloxacin Hives  . Ibuprofen Nausea And Vomiting  . Red Dye Diarrhea    Other reaction(s): Unknown    Patient Measurements: Height: 5' 5.5" (166.4 cm) Weight: 86.2 kg (190 lb) IBW/kg (Calculated) : 58.15   Vital Signs: Temp: 98.7 F (37.1 C) (05/11 1905) Temp Source: Oral (05/11 1905) BP: 94/67 (05/11 2045) Pulse Rate: 82 (05/11 2045)  Labs: Recent Labs    01/06/20 1949  HGB 9.2*  HCT 27.1*  PLT 329  APTT 30  LABPROT 13.1  INR 1.0  CREATININE 3.10*    Estimated Creatinine Clearance: 21.1 mL/min (A) (by C-G formula based on SCr of 3.1 mg/dL (H)).   Medical History: Past Medical History:  Diagnosis Date  . Bipolar 1 disorder ([place])   . Blood transfusion without reported diagnosis   . Cancer [facility])    renal cancer  . Chronic pain   . Diabetes mellitus without complication ([place])   . Hypertension   . IBS (irritable bowel syndrome)   . Opioid dependence ([place])   . Schizophrenia ([place])     Medications:  (Not in a hospital admission)   Assessment: 56 YOF with unexplained hypotension and elevated d-dimer concerning for PE. Unable to get CTA chest due to renal failure. Pharmacy consulted to empirically start IV heparin for r/o pulmonary embolus. H/H low. Plt wnl. SCr 3.1.   Goal of Therapy:  Heparin level 0.3-0.7 units/ml Monitor platelets by anticoagulation protocol: Yes   Plan:  -Heparin 4000 units IV bolus followed by IV heparin infusion at 1400 units/hr  -F/u 8 hr HL -Monitor daily HL, CBC and s/s of bleeding   Albertina Parr, PharmD., BCPS, BCCCP Clinical Pharmacist Clinical phone for 01/06/20 until 11:30pm: [phone] If after 11:30pm, please refer to [facility] for unit-specific pharmacist

## 2020-01-06 NOTE — ED Notes (Signed)
Patient transported to CT with nurse and monitor

## 2020-01-07 ENCOUNTER — Encounter (HOSPITAL_COMMUNITY): Payer: Self-pay | Admitting: Internal Medicine

## 2020-01-07 ENCOUNTER — Inpatient Hospital Stay (HOSPITAL_COMMUNITY): Payer: Medicare HMO

## 2020-01-07 DIAGNOSIS — E669 Obesity, unspecified: Secondary | ICD-10-CM

## 2020-01-07 DIAGNOSIS — I5032 Chronic diastolic (congestive) heart failure: Secondary | ICD-10-CM

## 2020-01-07 DIAGNOSIS — N179 Acute kidney failure, unspecified: Secondary | ICD-10-CM

## 2020-01-07 DIAGNOSIS — I9589 Other hypotension: Secondary | ICD-10-CM

## 2020-01-07 DIAGNOSIS — N189 Chronic kidney disease, unspecified: Secondary | ICD-10-CM

## 2020-01-07 DIAGNOSIS — F319 Bipolar disorder, unspecified: Secondary | ICD-10-CM

## 2020-01-07 DIAGNOSIS — E861 Hypovolemia: Secondary | ICD-10-CM

## 2020-01-07 DIAGNOSIS — Z905 Acquired absence of kidney: Secondary | ICD-10-CM

## 2020-01-07 DIAGNOSIS — C649 Malignant neoplasm of unspecified kidney, except renal pelvis: Secondary | ICD-10-CM | POA: Diagnosis present

## 2020-01-07 DIAGNOSIS — I10 Essential (primary) hypertension: Secondary | ICD-10-CM

## 2020-01-07 DIAGNOSIS — G894 Chronic pain syndrome: Secondary | ICD-10-CM

## 2020-01-07 LAB — BASIC METABOLIC PANEL
Anion gap: 9 (ref 5–15)
BUN: 30 mg/dL — ABNORMAL HIGH (ref 6–20)
CO2: 18 mmol/L — ABNORMAL LOW (ref 22–32)
Calcium: 8.5 mg/dL — ABNORMAL LOW (ref 8.9–10.3)
Chloride: 110 mmol/L (ref 98–111)
Creatinine, Ser: 1.41 mg/dL — ABNORMAL HIGH (ref 0.44–1.00)
GFR calc Af Amer: 47 mL/min — ABNORMAL LOW (ref >60)
GFR calc non Af Amer: 40 mL/min — ABNORMAL LOW (ref >60)
Glucose, Bld: 189 mg/dL — ABNORMAL HIGH (ref 70–99)
Potassium: 5.1 mmol/L (ref 3.5–5.1)
Sodium: 137 mmol/L (ref 135–145)

## 2020-01-07 LAB — CBC
HCT: 28.8 % — ABNORMAL LOW (ref 36.0–46.0)
Hemoglobin: 9.6 g/dL — ABNORMAL LOW (ref 12.0–15.0)
MCH: 31.6 pg (ref 26.0–34.0)
MCHC: 33.3 g/dL (ref 30.0–36.0)
MCV: 94.7 fL (ref 80.0–100.0)
Platelets: 320 10*3/uL (ref 150–400)
RBC: 3.04 MIL/uL — ABNORMAL LOW (ref 3.87–5.11)
RDW: 13.6 % (ref 11.5–15.5)
WBC: 10.1 10*3/uL (ref 4.0–10.5)
nRBC: 0 % (ref 0.0–0.2)

## 2020-01-07 LAB — RAPID URINE DRUG SCREEN, HOSP PERFORMED
Amphetamines: NOT DETECTED
Barbiturates: NOT DETECTED
Benzodiazepines: NOT DETECTED
Cocaine: NOT DETECTED
Opiates: POSITIVE — AB
Tetrahydrocannabinol: NOT DETECTED

## 2020-01-07 LAB — URINALYSIS, MICROSCOPIC (REFLEX)

## 2020-01-07 LAB — URINALYSIS, ROUTINE W REFLEX MICROSCOPIC
Bilirubin Urine: NEGATIVE
Glucose, UA: NEGATIVE mg/dL
Hgb urine dipstick: NEGATIVE
Ketones, ur: NEGATIVE mg/dL
Nitrite: NEGATIVE
Protein, ur: NEGATIVE mg/dL
Specific Gravity, Urine: 1.01 (ref 1.005–1.030)
pH: 6 (ref 5.0–8.0)

## 2020-01-07 LAB — HEPARIN LEVEL (UNFRACTIONATED)
Heparin Unfractionated: 0.62 IU/mL (ref 0.30–0.70)
Heparin Unfractionated: 0.65 IU/mL (ref 0.30–0.70)

## 2020-01-07 LAB — GLUCOSE, CAPILLARY
Glucose-Capillary: 142 mg/dL — ABNORMAL HIGH (ref 70–99)
Glucose-Capillary: 140 mg/dL — ABNORMAL HIGH (ref 70–99)
Glucose-Capillary: 143 mg/dL — ABNORMAL HIGH (ref 70–99)

## 2020-01-07 LAB — D-DIMER, QUANTITATIVE: D-Dimer, Quant: 2.62 ug/mL-FEU — ABNORMAL HIGH (ref 0.00–0.50)

## 2020-01-07 MED ORDER — GABAPENTIN 400 MG PO CAPS
800.0000 mg | ORAL_CAPSULE | Freq: Three times a day (TID) | ORAL | Status: DC
  Administered 2020-01-07 (×3): 800 mg via ORAL
  Filled 2020-01-07 (×3): qty 2

## 2020-01-07 MED ORDER — DIPHENHYDRAMINE HCL 50 MG/ML IJ SOLN
25.0000 mg | Freq: Once | INTRAMUSCULAR | Status: AC
  Administered 2020-01-07: 25 mg via INTRAVENOUS
  Filled 2020-01-07: qty 1

## 2020-01-07 MED ORDER — INSULIN GLARGINE 100 UNIT/ML ~~LOC~~ SOLN
5.0000 [IU] | Freq: Every day | SUBCUTANEOUS | Status: DC
  Administered 2020-01-07 – 2020-01-09 (×3): 5 [IU] via SUBCUTANEOUS
  Filled 2020-01-07 (×5): qty 0.05

## 2020-01-07 MED ORDER — SODIUM CHLORIDE 0.9% FLUSH
3.0000 mL | Freq: Two times a day (BID) | INTRAVENOUS | Status: DC
  Administered 2020-01-07 – 2020-01-09 (×5): 3 mL via INTRAVENOUS

## 2020-01-07 MED ORDER — TRAZODONE HCL 50 MG PO TABS: 50.0000 mg | ORAL_TABLET | Freq: Every evening | ORAL | Status: DC | PRN

## 2020-01-07 MED ORDER — QUETIAPINE FUMARATE ER 50 MG PO TB24
50.0000 mg | ORAL_TABLET | Freq: Every day | ORAL | Status: DC
  Administered 2020-01-07 – 2020-01-09 (×3): 50 mg via ORAL
  Filled 2020-01-07 (×5): qty 1

## 2020-01-07 MED ORDER — ACETAMINOPHEN 325 MG PO TABS
650.0000 mg | ORAL_TABLET | Freq: Four times a day (QID) | ORAL | Status: DC | PRN
  Administered 2020-01-07 – 2020-01-10 (×6): 650 mg via ORAL
  Filled 2020-01-07 (×6): qty 2

## 2020-01-07 MED ORDER — DOCUSATE SODIUM 100 MG PO CAPS
100.0000 mg | ORAL_CAPSULE | Freq: Two times a day (BID) | ORAL | Status: DC
  Administered 2020-01-07 – 2020-01-10 (×6): 100 mg via ORAL
  Filled 2020-01-07 (×7): qty 1

## 2020-01-07 MED ORDER — OXYCODONE-ACETAMINOPHEN 5-325 MG PO TABS
1.0000 | ORAL_TABLET | Freq: Four times a day (QID) | ORAL | Status: DC | PRN
  Administered 2020-01-08 – 2020-01-10 (×6): 1 via ORAL
  Filled 2020-01-07 (×6): qty 1

## 2020-01-07 MED ORDER — TIZANIDINE HCL 4 MG PO TABS: 4.0000 mg | ORAL_TABLET | Freq: Three times a day (TID) | ORAL | Status: DC | PRN

## 2020-01-07 MED ORDER — ONDANSETRON HCL 4 MG PO TABS: 4.0000 mg | ORAL_TABLET | Freq: Four times a day (QID) | ORAL | Status: DC | PRN

## 2020-01-07 MED ORDER — LACTATED RINGERS IV SOLN: INTRAVENOUS | Status: DC

## 2020-01-07 MED ORDER — HYDRALAZINE HCL 20 MG/ML IJ SOLN: 5.0000 mg | INTRAMUSCULAR | Status: DC | PRN

## 2020-01-07 MED ORDER — POLYETHYLENE GLYCOL 3350 17 G PO PACK: 17.0000 g | PACK | Freq: Every day | ORAL | Status: DC | PRN

## 2020-01-07 MED ORDER — GABAPENTIN 300 MG PO CAPS
600.0000 mg | ORAL_CAPSULE | Freq: Three times a day (TID) | ORAL | Status: DC
  Administered 2020-01-08 – 2020-01-10 (×7): 600 mg via ORAL
  Filled 2020-01-07 (×7): qty 2

## 2020-01-07 MED ORDER — ACETAMINOPHEN 650 MG RE SUPP: 650.0000 mg | Freq: Four times a day (QID) | RECTAL | Status: DC | PRN

## 2020-01-07 MED ORDER — HYDROXYZINE HCL 25 MG PO TABS: 25.0000 mg | ORAL_TABLET | Freq: Four times a day (QID) | ORAL | Status: DC | PRN

## 2020-01-07 MED ORDER — TRAZODONE HCL 100 MG PO TABS
100.0000 mg | ORAL_TABLET | Freq: Every day | ORAL | Status: DC
  Administered 2020-01-07 – 2020-01-09 (×3): 100 mg via ORAL
  Filled 2020-01-07 (×3): qty 1

## 2020-01-07 MED ORDER — MORPHINE SULFATE ER 15 MG PO TBCR
15.0000 mg | EXTENDED_RELEASE_TABLET | Freq: Two times a day (BID) | ORAL | Status: DC
  Administered 2020-01-07 – 2020-01-10 (×6): 15 mg via ORAL
  Filled 2020-01-07 (×6): qty 1

## 2020-01-07 MED ORDER — PANTOPRAZOLE SODIUM 40 MG PO TBEC
40.0000 mg | DELAYED_RELEASE_TABLET | Freq: Two times a day (BID) | ORAL | Status: DC
  Administered 2020-01-07 – 2020-01-10 (×6): 40 mg via ORAL
  Filled 2020-01-07 (×6): qty 1

## 2020-01-07 MED ORDER — SACCHAROMYCES BOULARDII 250 MG PO CAPS
250.0000 mg | ORAL_CAPSULE | Freq: Two times a day (BID) | ORAL | Status: DC
  Administered 2020-01-07 – 2020-01-10 (×7): 250 mg via ORAL
  Filled 2020-01-07 (×7): qty 1

## 2020-01-07 MED ORDER — INSULIN ASPART 100 UNIT/ML ~~LOC~~ SOLN
0.0000 [IU] | Freq: Three times a day (TID) | SUBCUTANEOUS | Status: DC
  Administered 2020-01-07 – 2020-01-08 (×3): 1 [IU] via SUBCUTANEOUS
  Administered 2020-01-08: 3 [IU] via SUBCUTANEOUS
  Administered 2020-01-09: 1 [IU] via SUBCUTANEOUS
  Administered 2020-01-09: 3 [IU] via SUBCUTANEOUS
  Administered 2020-01-10 (×2): 1 [IU] via SUBCUTANEOUS

## 2020-01-07 MED ORDER — ONDANSETRON HCL 4 MG/2ML IJ SOLN
4.0000 mg | Freq: Four times a day (QID) | INTRAMUSCULAR | Status: DC | PRN
  Administered 2020-01-08: 4 mg via INTRAVENOUS
  Filled 2020-01-07: qty 2

## 2020-01-07 MED ORDER — METHOCARBAMOL 500 MG PO TABS
500.0000 mg | ORAL_TABLET | Freq: Four times a day (QID) | ORAL | Status: DC | PRN
  Administered 2020-01-08 – 2020-01-09 (×2): 500 mg via ORAL
  Filled 2020-01-07 (×2): qty 1

## 2020-01-07 MED ORDER — INSULIN ASPART 100 UNIT/ML ~~LOC~~ SOLN: 0.0000 [IU] | Freq: Every day | SUBCUTANEOUS | Status: DC

## 2020-01-07 MED ORDER — HEPARIN SODIUM (PORCINE) 5000 UNIT/ML IJ SOLN
5000.0000 [IU] | Freq: Three times a day (TID) | INTRAMUSCULAR | Status: DC
  Administered 2020-01-07 – 2020-01-10 (×8): 5000 [IU] via SUBCUTANEOUS
  Filled 2020-01-07 (×8): qty 1

## 2020-01-07 MED ORDER — BISACODYL 5 MG PO TBEC: 5.0000 mg | DELAYED_RELEASE_TABLET | Freq: Every day | ORAL | Status: DC | PRN

## 2020-01-07 MED ORDER — ATORVASTATIN CALCIUM 80 MG PO TABS
80.0000 mg | ORAL_TABLET | Freq: Every day | ORAL | Status: DC
  Administered 2020-01-07 – 2020-01-10 (×4): 80 mg via ORAL
  Filled 2020-01-07 (×4): qty 1

## 2020-01-07 MED ORDER — MORPHINE SULFATE ER 30 MG PO TBCR: 30.0000 mg | EXTENDED_RELEASE_TABLET | Freq: Two times a day (BID) | ORAL | Status: DC

## 2020-01-07 MED ORDER — TECHNETIUM TO 99M ALBUMIN AGGREGATED
1.6000 | Freq: Once | INTRAVENOUS | Status: AC | PRN
  Administered 2020-01-07: 1.6 via INTRAVENOUS

## 2020-01-07 MED ORDER — PROCHLORPERAZINE EDISYLATE 10 MG/2ML IJ SOLN
10.0000 mg | Freq: Once | INTRAMUSCULAR | Status: AC
  Administered 2020-01-07: 10 mg via INTRAVENOUS
  Filled 2020-01-07: qty 2

## 2020-01-07 NOTE — Progress Notes (Signed)
Patient arrived via [facility] to 4E room 15.  Pt in no apparent distress.  CHG bath and skin assessment completed.  Telemetry monitor applied and CCMD notified.  Patient oriented to unit and room to include call light and phone.  Will continue to monitor.

## 2020-01-07 NOTE — ED Notes (Signed)
I verified with the patient about her fall.  She stated that she slipped/fell at her daughter's house on Sunday and on Monday, she slipped at [facility] bathroom.  She denies of fainting in both incidents.

## 2020-01-07 NOTE — ED Notes (Signed)
Patient is resting comfortably, alert and oriented and was updated for her room assignment at [facility].  Informed her that I will informed her once [facility] will call me for report. Verbalized understanding.

## 2020-01-07 NOTE — Progress Notes (Signed)
Orthopedic Tech Progress Note Patient Details:  Patrina CHAYLEN FALCK Jan 09, 1961 [id number]  Ortho Devices Type of Ortho Device: Ace wrap Ortho Device/Splint Location: Right Wrist Ortho Device/Splint Interventions: Application   Post Interventions Patient Tolerated: Well Instructions Provided: Adjustment of device   Keagon Glascoe E Arnisha Laffoon 01/07/2020, 9:48 AM

## 2020-01-07 NOTE — ED Notes (Signed)
Pt awake and oriented x 3  Skin warm and dry  Assisted pt up to bedside commode  Voided large amount  Bed linens changed and straightened  Assisted pt back to bed  Tolerated well  No acute distress noted

## 2020-01-07 NOTE — H&P (Signed)
History and Physical    Amy Nunez [id number] DOB: 1961/03/06 DOA: 01/06/2020  PCP: Einar Pheasant, DO Consultants:  Lyndel Safe - GI; Pain management Patient coming from:  Home - lives with oldest daughter and her 3 kids; NOK: Daughters, Trish Fountain, [phone]  Chief Complaint: Dizziness  HPI: Amy Nunez is a 59 y.o. female with medical history significant of schizophrenia/bipolar; opioid dependence/chronic pain; HTN; DM; grade 1 diastolic CHF; and renal cancer s/p remote R nephrectomy presenting to [facility] with dizziness.  She reports that she has been having panic and hurt her wrist.  She fell on Monday in hte bathroom at [facility] - she slipped.  She also had fallen off the bed at her daughter's house but didn't really herself.  Her equilibrium was off a little bit.  She has been eating and drinking some but not a whole lot.  She is not sure why she is so dehydrated - "I wasn't drinking no alcohol or anything to make me dehydrated so I don't know."  She currently feels a little headache and her wrist hurts and she is hungry.  She has not felt SOB.  No N/V/D.  No fever.  She is now able to void appropriately (was not making urine initially at [facility]).   She was last hospitalized from 4/15-22 for abdominal pain with n/v, thought to be due to chronic opioid-induced constipation.  She is on chronic opioids but family assists with medications.    ED Course:  [facility] to [facility] transfer, per Dr. Flossie Buffy:  Patient reportedly had a fall 2 days ago hitting her head due to dizziness. She was found  notably hypotensive on arrival with systolic of 60 with improved up to 90s after getting 2 L of fluid. MAP above 70. She is now on her third liter.  Takes trazodone, oxycodone, tizanidine and gabapentin but has not taken more than recommended. CBC shows no leukocytosis with stable anemia with hemoglobin 9.2.  Has acute on chronic kidney injury with creatinine of 3.10 with a prior of 1.06.  Has not yet  been able to produce urine in the ED.  CT scan of her head, cervical spine, CT renal stone search all negative.  Unclear if her hypotension is secondary to polypharmacy.  However she does have a elevated D-dimer of 2.40 but not sure if that has to do with her AKI.  Could consider stat VQ scan on arrival if there is not any improvement in her blood pressure.  She currently is not tachycardic or hypoxic.  Admit to stepdown at [facility] as stat V/Q scan can only be done there at night if warranted.   Review of Systems: As per HPI; otherwise review of systems reviewed and negative.   Ambulatory Status:  Ambulates without assistance  COVID Vaccine Status:  None  Past Medical History:  Diagnosis Date  . Bipolar 1 disorder ([place])   . Blood transfusion without reported diagnosis   . Cancer [facility])    renal cancer  . Chronic pain   . Diabetes mellitus without complication ([place])   . Hypertension   . IBS (irritable bowel syndrome)   . Opioid dependence ([place])   . Schizophrenia [facility])     Past Surgical History:  Procedure Laterality Date  . ABDOMINAL HYSTERECTOMY    . ANKLE SURGERY    . COLONOSCOPY  2018   [facility]  . ESOPHAGOGASTRODUODENOSCOPY  10/15/2016   [facility]  . LAPAROSCOPIC APPENDECTOMY N/A 11/04/2019   Procedure: APPENDECTOMY LAPAROSCOPIC;  Surgeon:  Coralie Keens, MD;  Location: [facility] ORS;  Service: General;  Laterality: N/A;  . NEPHRECTOMY Right     Social History   Socioeconomic History  . Marital status: Single    Spouse name: Not on file  . Number of children: Not on file  . Years of education: Not on file  . Highest education level: Not on file  Occupational History  . Occupation: disabled  Tobacco Use  . Smoking status: Current Some Day Smoker    Types: E-cigarettes  . Smokeless tobacco: Never Used  Substance and Sexual Activity  . Alcohol use: Not Currently  . Drug use: Never  . Sexual activity: Not on file  Other Topics Concern  . Not on file  Social  History Narrative  . Not on file   Social Determinants of Health   Financial Resource Strain:   . Difficulty of Paying Living Expenses:   Food Insecurity:   . Worried About Charity fundraiser in the Last Year:   . Arboriculturist in the Last Year:   Transportation Needs:   . Film/video editor (Medical):   Marland Kitchen Lack of Transportation (Non-Medical):   Physical Activity:   . Days of Exercise per Week:   . Minutes of Exercise per Session:   Stress:   . Feeling of Stress :   Social Connections:   . Frequency of Communication with Friends and Family:   . Frequency of Social Gatherings with Friends and Family:   . Attends Religious Services:   . Active Member of Clubs or Organizations:   . Attends Archivist Meetings:   Marland Kitchen Marital Status:   Intimate Partner Violence:   . Fear of Current or Ex-Partner:   . Emotionally Abused:   Marland Kitchen Physically Abused:   . Sexually Abused:     Allergies  Allergen Reactions  . Penicillins Anaphylaxis and Rash    Other reaction(s): Unknown  . Ciprofloxacin Hives  . Ibuprofen Nausea And Vomiting  . Red Dye Diarrhea    Other reaction(s): Unknown    Family History  Problem Relation Age of Onset  . Hypertension Other   . Colon cancer Neg Hx   . Esophageal cancer Neg Hx     Prior to Admission medications   Medication Sig Start Date End Date Taking? Authorizing Provider  acetaminophen (TYLENOL) 500 MG tablet Take 2 tablets (1,000 mg total) by mouth every 6 (six) hours. 11/07/19   Saverio Danker, PA-C  amLODipine (NORVASC) 10 MG tablet Take 10 mg by mouth daily. 11/20/19   [provider]  atorvastatin (LIPITOR) 80 MG tablet Take 80 mg by mouth daily.    [provider]  docusate sodium (COLACE) 100 MG capsule Take 1 capsule (100 mg total) by mouth 2 (two) times daily. 12/18/19   Amin, Jeanella Flattery, MD  gabapentin (NEURONTIN) 800 MG tablet Take 1 tablet (800 mg total) by mouth 2 (two) times daily. Patient taking  differently: Take 800 mg by mouth 3 (three) times daily.  09/04/19   Mariel Aloe, MD  hydrOXYzine (ATARAX/VISTARIL) 25 MG tablet Take 25 mg by mouth every 6 (six) hours as needed for anxiety. 11/20/19   [provider]  LANTUS SOLOSTAR 100 UNIT/ML Solostar Pen Inject 5 Units into the skin at bedtime. 09/20/19   Kayleen Memos, DO  lisinopril (ZESTRIL) 40 MG tablet Take 0.5 tablets (20 mg total) by mouth daily. 11/08/19   Georgette Shell, MD  metoCLOPramide (REGLAN) 5 MG tablet  Take 1 tablet (5 mg total) by mouth 4 (four) times daily -  before meals and at bedtime for 15 days. 12/18/19 01/02/20  Amin, Jeanella Flattery, MD  metoprolol tartrate (LOPRESSOR) 25 MG tablet Take 0.5 tablets (12.5 mg total) by mouth 2 (two) times daily. 12/18/19   Amin, Jeanella Flattery, MD  metroNIDAZOLE (FLAGYL) 500 MG tablet Take 500 mg by mouth every 6 (six) hours. 7 day supply 12/08/19   [provider]  morphine (MS CONTIN) 30 MG 12 hr tablet Take 30 mg by mouth every 12 (twelve) hours. 08/28/19   [provider]  ondansetron (ZOFRAN) 4 MG tablet Take 1 tablet (4 mg total) by mouth daily as needed for nausea or vomiting. 11/08/19 11/07/20  Georgette Shell, MD  oxyCODONE-acetaminophen (PERCOCET) 10-325 MG tablet Take 1 tablet by mouth as needed.  11/24/19   [provider]  pantoprazole (PROTONIX) 40 MG tablet Take 1 tablet (40 mg total) by mouth 2 (two) times daily before a meal. 12/18/19 01/17/20  Amin, Jeanella Flattery, MD  polyethylene glycol (MIRALAX / GLYCOLAX) 17 g packet Take 17 g by mouth 2 (two) times daily. Patient taking differently: Take 17 g by mouth as needed for mild constipation.  11/08/19   Georgette Shell, MD  promethazine (PHENERGAN) 25 MG tablet Take 25 mg by mouth every 8 (eight) hours as needed for nausea or vomiting.     [provider]  QUEtiapine (SEROQUEL XR) 50 MG TB24 24 hr tablet Take 50 mg by mouth at bedtime. 11/20/19   [provider]    saccharomyces boulardii (FLORASTOR) 250 MG capsule Take 1 capsule (250 mg total) by mouth 2 (two) times daily. 12/18/19   Amin, Jeanella Flattery, MD  tiZANidine (ZANAFLEX) 4 MG tablet Take 4 mg by mouth every 8 (eight) hours as needed for muscle spasms.  08/25/19   [provider]  traZODone (DESYREL) 50 MG tablet Take 50 mg by mouth at bedtime as needed for sleep. 11/20/19   [provider]    Physical Exam: Vitals:   01/07/20 0430 01/07/20 0625 01/07/20 0730 01/07/20 0909  BP: 117/67 125/83 117/66 (!) 125/59  Pulse: 82 88 96 93  Resp: 12 12 (!) 23 16  Temp:  98.6 F (37 C)  98.5 F (36.9 C)  TempSrc:  Oral  Oral  SpO2: 99% 98% 97% 100%  Weight:    88.9 kg  Height:    5\' 5"  (1.651 m)     . General:  Appears calm and comfortable and is NAD . Eyes:   EOMI, normal lids, iris . ENT:  grossly normal hearing, lips & tongue, mmm; edentulous . Neck:  no LAD, masses or thyromegaly; IV in R neck . Cardiovascular:  RRR, no m/r/g. No LE edema.  Marland Kitchen Respiratory:   CTA bilaterally with no wheezes/rales/rhonchi.  Normal respiratory effort. . Abdomen:  soft, NT, ND, NABS . Skin:  no rash or induration seen on limited exam . Musculoskeletal:  grossly normal tone BUE/BLE, good ROM, no bony abnormality; ACE wrap on R wrist . Psychiatric:  grossly normal mood and affect, speech fluent and appropriate, AOx3 . Neurologic:  CN 2-12 grossly intact, moves all extremities in coordinated fashion    Radiological Exams on Admission: DG Wrist Complete Right  Result Date: 01/06/2020 CLINICAL DATA:  [place], right wrist pain EXAM: RIGHT WRIST - COMPLETE 3+ VIEW COMPARISON:  None. FINDINGS: Frontal, oblique, lateral, and ulnar deviated views of the right wrist are obtained. No fracture,  subluxation, or dislocation. Joint spaces are well preserved. Soft tissues are normal. IMPRESSION: 1. Unremarkable right wrist. Electronically Signed   By: Randa Ngo M.D.   On: 01/06/2020 20:08   CT Head Wo  Contrast  Result Date: 01/06/2020 CLINICAL DATA:  Status post recent fall. EXAM: CT HEAD WITHOUT CONTRAST TECHNIQUE: Contiguous axial images were obtained from the base of the skull through the vertex without intravenous contrast. COMPARISON:  September 01, 2019. FINDINGS: Brain: There is mild cerebral atrophy with widening of the extra-axial spaces and ventricular dilatation. There are areas of decreased attenuation within the white matter tracts of the supratentorial brain, consistent with microvascular disease changes. A small chronic right basal ganglia lacunar infarct is seen. Vascular: No hyperdense vessel or unexpected calcification. Skull: Normal. Negative for fracture or focal lesion. Sinuses/Orbits: No acute finding. Other: None. IMPRESSION: 1. Generalized cerebral atrophy. 2. No acute intracranial abnormality. Electronically Signed   By: Virgina Norfolk M.D.   On: 01/06/2020 20:09   CT Cervical Spine Wo Contrast  Result Date: 01/06/2020 CLINICAL DATA:  Status post fall 2 days ago. EXAM: CT CERVICAL SPINE WITHOUT CONTRAST TECHNIQUE: Multidetector CT imaging of the cervical spine was performed without intravenous contrast. Multiplanar CT image reconstructions were also generated. COMPARISON:  September 01, 2019 FINDINGS: Alignment: Normal. Skull base and vertebrae: No acute fracture. No primary bone lesion or focal pathologic process. Soft tissues and spinal canal: No prevertebral fluid or swelling. No visible canal hematoma. Disc levels: There is moderate severity multilevel endplate sclerosis with moderate to marked severity multilevel intervertebral disc space narrowing. Upper chest: Negative. Other: None. IMPRESSION: 1. No acute fracture or dislocation of the cervical spine. 2. Moderate to marked severity multilevel degenerative changes. Electronically Signed   By: Virgina Norfolk M.D.   On: 01/06/2020 20:17   DG Chest Port 1 View  Result Date: 01/06/2020 CLINICAL DATA:  Altered level of  consciousness, hypotension, fell EXAM: PORTABLE CHEST 1 VIEW COMPARISON:  12/14/2019 FINDINGS: The heart size and mediastinal contours are within normal limits. Both lungs are clear. The visualized skeletal structures are unremarkable. IMPRESSION: No active disease. Electronically Signed   By: Randa Ngo M.D.   On: 01/06/2020 20:07   CT Renal Stone Study  Result Date: 01/06/2020 CLINICAL DATA:  Status post fall 2 days ago. EXAM: CT ABDOMEN AND PELVIS WITHOUT CONTRAST TECHNIQUE: Multidetector CT imaging of the abdomen and pelvis was performed following the standard protocol without IV contrast. COMPARISON:  CT abdomen and pelvis 12/11/2019. FINDINGS: Lower chest: Lung bases clear. Hepatobiliary: Minimal dependent atelectasis. Lung bases otherwise clear. No pleural or pericardial effusion. Pancreas: Unremarkable. No pancreatic ductal dilatation or surrounding inflammatory changes. Spleen: Normal in size without focal abnormality. Adrenals/Urinary Tract: The adrenal glands appear normal. Status post right nephrectomy. A cyst in the lower pole of the left kidney is unchanged. Urinary bladder appears normal. Stomach/Bowel: Stomach is within normal limits. Appendix appears normal. Status post appendectomy. No evidence of bowel wall thickening, distention, or inflammatory changes. Small hiatal hernia and scattered diverticulosis without diverticulitis noted. Vascular/Lymphatic: Aortic atherosclerosis. No enlarged abdominal or pelvic lymph nodes. Reproductive: Status post hysterectomy. No adnexal masses. Other: None. Musculoskeletal: No acute or focal abnormality. Degenerative disc disease lower lumbar spine noted. IMPRESSION: No acute abnormality abdomen or pelvis. Aortic Atherosclerosis (ICD10-I70.0). Small hiatal hernia. Diverticulosis without diverticulitis. Electronically Signed   By: Inge Rise M.D.   On: 01/06/2020 21:20    EKG: Independently reviewed.  NSR with rate 72; no evidence of acute  ischemia  Labs on Admission: I have personally reviewed the available labs and imaging studies at the time of the admission.  Pertinent labs:   Na++ 132 CO2 18 Glucose 167 BUN 50/Creatinine 3.10/GFR 18; 21/1.06/>60 on 4/22 Lactate 1.0 WBC 10.5 Hgb 9.2 D-dimer 2.40 INR 1.0 APAP 19 ASA <7 UA: small ketones, few bacteria UDS + opiates   Assessment/Plan Principal Problem:   Hypotension Active Problems:   Type 2 diabetes mellitus (HCC)   Benign essential HTN   Acute kidney injury superimposed on chronic kidney disease (HCC)   Bipolar 1 disorder (HCC)   Renal cancer (HCC)   Chronic pain syndrome   Chronic diastolic CHF (congestive heart failure) (HCC)   Obesity (BMI 30.0-34.9)   Hypotension resulting from hypovolemia -Patient with 2 recent falls (?unrelated) who presented to [facility] with apparently profound dehydration - marked and recurrent hypotension with creatinine markedly elevated -She reports mildly decreased appetite  -No GI symptoms or apparently excessive volume loss -Due to recurrent hypotension, D-dimer was checked and was markedly elevated; as such, patient was started on Heparin and she was transferred for VQ scan. -Very low current suspicion for PE given no respiratory symptoms but will obtain VQ and then likely d/c heparin; repeat D-dimer was also ordered in case this was artificially high initially -Meanwhile, BPs have improved but not normalized (she is usually on multiple medications) -Will admit, continue to hydrate, and monitor for now on telemetry  AKI on stage 2-3a CKD  -Baseline GFR about 60, currently 21 -As noted above, she appeared to be profoundly dehydrated at the time of presentation -She was also taking Lisinopril and Mobic -Repeat STAT BMP was ordered upon arrival at [facility] and is pending -Anticipate improvement with IVF, which is ongoing  DM -Will check A1c -hold Glucophage -Continue Lantus -Cover with moderate-scale  SSI  Bipolar -Continue Seroquel, Trazodone  Renal CA -R nephrectomy predates 2017 imaging -No apparent issues at this time -Given solitary kidney (remote, >20 years), would strongly suggest no NSAIDs  Chronic pain -I have reviewed this patient in the [place] Controlled Substances Reporting System.  She is receiving medications from only one provider and appears to be taking them as prescribed. -She is at extremely high risk of opioid misuse, diversion, or overdose. -Given recent falls and prior hospitalization for constipation with encouragement to taper off opioids, I have decreased her dose of MS Contin from 30 mg BID to 15 mg BID.  -Will continue prn Percocet  -Will transition Zanaflex to Robaxin to decrease sedative effects -Her daughter is concerned that she is taking too much medication, including her psychiatric medications -Her daughter is concerned about possibly developing dementia - I have encouraged outpatient neuropsychiatric testing although sedating medications also could make her confused or appear to have dementia -Phenergan also held due to sedating effects, suggest transition to Zofran if needed as an outpatient  Chronic diastolic CHF -Grade 1 diastolic dysfunction in [id number] -Appears compensated at this time -Will need to be monitored while receiving IVF  HTN -Hold BP medications due to still low-normal BP -She normally takes Norvasc (likely ok to resume tomorrow) and Lisinopril (holding due to renal dysfunction) -She reportedly takes Lopressor prn, none recently  Obesity Body mass index is 32.61 kg/m.  -Weight loss should be encouraged -Outpatient PCP/bariatric medicine f/u encouraged     Note: This patient has been tested and is negative for the novel coronavirus COVID-19.  DVT prophylaxis: Heparin Code Status:  Full - confirmed with patient Family Communication: None present; I  spoke with the patient's daughter by telephone at the time of  admission. Disposition Plan:  The patient is from: home  Anticipated d/c is to: home without [facility] services  Anticipated d/c date will depend on clinical response to treatment, likely 2-3 days  Patient is currently: acutely ill Consults called: PT/OT Admission status:  Admit - It is my clinical opinion that admission to [place] is reasonable and necessary because of the expectation that this patient will require hospital care that crosses at least 2 midnights to treat this condition based on the medical complexity of the problems presented.  Given the aforementioned information, the predictability of an adverse outcome is felt to be significant.    Karmen Bongo MD [facility]   How to contact the [facility] Attending or Consulting provider Power or covering provider during after hours [place], for this patient?  1. Check the care team in [facility] and look for a) attending/consulting TRH provider listed and b) the [facility] team listed 2. Log into [email or web address] and use [place]'s universal password to access. If you do not have the password, please contact the hospital operator. 3. Locate the [facility] provider you are looking for under [facility] and page to a number that you can be directly reached. 4. If you still have difficulty reaching the provider, please page the [facility] (Director on Call) for the Hospitalists listed on amion for assistance.   01/07/2020, 10:16 AM

## 2020-01-07 NOTE — ED Notes (Signed)
Attempted to call for report and I spoke with the Secretary and she stated to call them back in 2 minutes.  I asked her if the nurse can call me and I gave her the phone number.  Awaiting for the nurse to call me back.

## 2020-01-07 NOTE — ED Notes (Signed)
Pt up to bedside commode and voided large amount of pale yellow urine  Back to bed without incident  Tolerated well

## 2020-01-07 NOTE — Progress Notes (Addendum)
[place] for heparin Indication: r/o pulmonary embolus  Allergies  Allergen Reactions  . Penicillins Anaphylaxis and Rash    Other reaction(s): Unknown  . Ciprofloxacin Hives  . Ibuprofen Nausea And Vomiting  . Red Dye Diarrhea    Other reaction(s): Unknown    Patient Measurements: Height: 5' 5.5" (166.4 cm) Weight: 86.2 kg (190 lb) IBW/kg (Calculated) : 58.15   Vital Signs: Temp: 98.6 F (37 C) (05/12 0625) Temp Source: Oral (05/12 0625) BP: 117/66 (05/12 0730) Pulse Rate: 96 (05/12 0730)  Labs: Recent Labs    01/06/20 1949 01/07/20 0620  HGB 9.2* 9.6*  HCT 27.1* 28.8*  PLT 329 320  APTT 30  --   LABPROT 13.1  --   INR 1.0  --   HEPARINUNFRC  --  0.62  CREATININE 3.10*  --     Estimated Creatinine Clearance: 21.1 mL/min (A) (by C-G formula based on SCr of 3.1 mg/dL (H)).  Assessment: 8 YOF with unexplained hypotension and elevated d-dimer concerning for PE. Unable to get CTA chest due to renal failure. Pharmacy consulted to empirically start IV heparin for r/o pulmonary embolus. Initial heparin level is therapeutic at 0.62. No bleeding noted. CBC is stable.   Goal of Therapy:  Heparin level 0.3-0.7 units/ml Monitor platelets by anticoagulation protocol: Yes   Plan:  Continue heparin gtt at 1400 units/hr Recheck another heparin level in 8 hours Daily heparin level and CBC F/u VQ scan  Salome Arnt, PharmD, BCPS Clinical Pharmacist Please see AMION for all pharmacy numbers 01/07/2020 8:56 AM  Addendum: Repeat heparin level is therapeutic at 0.65. No bleeding noted. Continue current rate for now and f/u VQ scan.   Salome Arnt, PharmD, BCPS Clinical Pharmacist Please see AMION for all pharmacy numbers 01/07/2020 3:02 PM

## 2020-01-07 NOTE — Evaluation (Signed)
Physical Therapy One time Evaluation Patient Details Name: Rodneisha RYIN QUINONES MRN: [id number] DOB: 23-Oct-1960 Today's Date: 01/07/2020   History of Present Illness  Amy Nunez is a 59 y.o. female with medical history significant of schizophrenia/bipolar; opioid dependence/chronic pain; HTN; DM; grade 1 diastolic CHF; and renal cancer s/p remote R nephrectomy presenting to [facility] with dizziness.  She reports that she has been having panic and hurt her wrist.  She fell on Monday in hte bathroom at [facility] - she slipped.  She also had fallen off the bed at her daughter's house but didn't really herself.  Her equilibrium was off a little bit.    Clinical Impression  Pt admitted with above diagnosis. Pt was able to ambulate without device without LOB. DGI 23/24. Pt was not orthostatic during session today as well.   Pt states she is back to baseline as well.  Does not need PT at this time.  Will sign off.   Orthostatic BPs  Supine 139/82  Sitting 140/93  Standing 150/92    Follow Up Recommendations No PT follow up    Equipment Recommendations  None recommended by PT    Recommendations for Other Services       Precautions / Restrictions Precautions Precautions: Fall Required Braces or Orthoses: Splint/Cast Splint/Cast: right wrist splint Restrictions Weight Bearing Restrictions: No      Mobility  Bed Mobility Overal bed mobility: Independent                Transfers Overall transfer level: Independent                  Ambulation/Gait Ambulation/Gait assistance: Supervision;Independent Gait Distance (Feet): 200 Feet Assistive device: None Gait Pattern/deviations: WFL(Within Functional Limits)   Gait velocity interpretation: <1.8 ft/sec, indicate of risk for recurrent falls General Gait Details: steady, no loss of balance with challenges to balance,2/4 dyspnea   Stairs            Wheelchair Mobility    Modified Rankin (Stroke Patients Only)        Balance Overall balance assessment: Independent                               Standardized Balance Assessment Standardized Balance Assessment : Dynamic Gait Index   Dynamic Gait Index Level Surface: Normal Change in Gait Speed: Normal Gait with Horizontal Head Turns: Normal Gait with Vertical Head Turns: Normal Gait and Pivot Turn: Normal Step Over Obstacle: Normal Step Around Obstacles: Normal Steps: Mild Impairment Total Score: 23       Pertinent Vitals/Pain Pain Assessment: No/denies pain    Home Living Family/patient expects to be discharged to:: Private residence Living Arrangements: Children Available Help at Discharge: Family;Available 24 hours/day(daughter works from home, grandson works nights) Type of Home: House Home Access: Level entry     [place]: Two level;Able to live on main level with bedroom/bathroom Home Equipment: Bedside commode;Walker - 2 wheels Additional Comments: pt lives with her daughter and 3 grandaughter (5, 1 in college), grandson (21)    Prior Function Level of Independence: Independent               Hand Dominance   Dominant Hand: Right    Extremity/Trunk Assessment   Upper Extremity Assessment Upper Extremity Assessment: Defer to OT evaluation    Lower Extremity Assessment Lower Extremity Assessment: Generalized weakness    Cervical / Trunk Assessment Cervical / Trunk Assessment: Normal  Communication   Communication: No difficulties  Cognition Arousal/Alertness: Awake/alert Behavior During Therapy: WFL for tasks assessed/performed Overall Cognitive Status: Within Functional Limits for tasks assessed                                        General Comments      Exercises     Assessment/Plan    PT Assessment Patent does not need any further PT services  PT Problem List         PT Treatment Interventions      PT Goals (Current goals can be found in the Care Plan  section)  Acute Rehab PT Goals PT Goal Formulation: All assessment and education complete, DC therapy    Frequency     Barriers to discharge        Co-evaluation               AM-PAC PT "6 Clicks" Mobility  Outcome Measure Help needed turning from your back to your side while in a flat bed without using bedrails?: None Help needed moving from lying on your back to sitting on the side of a flat bed without using bedrails?: None Help needed moving to and from a bed to a chair (including a wheelchair)?: None Help needed standing up from a chair using your arms (e.g., wheelchair or bedside chair)?: None Help needed to walk in hospital room?: None Help needed climbing 3-5 steps with a railing? : None 6 Click Score: 24    End of Session Equipment Utilized During Treatment: Gait belt Activity Tolerance: Patient tolerated treatment well Patient left: with call bell/phone within reach(on toilet in bathroom) Nurse Communication: Mobility status      Time: [id number] PT Time Calculation (min) (ACUTE ONLY): 23 min   Charges:   PT Evaluation $PT Eval Low Complexity: 1 Low PT Treatments $Gait Training: 8-22 mins        Deniyah Dillavou W,PT Acute Rehabilitation Services Pager:  [phone]  Office:  [phone]    Denice Paradise 01/07/2020, 1:48 PM

## 2020-01-08 DIAGNOSIS — Z79899 Other long term (current) drug therapy: Secondary | ICD-10-CM

## 2020-01-08 LAB — LIPID PANEL
Cholesterol: 137 mg/dL (ref 0–200)
HDL: 25 mg/dL — ABNORMAL LOW (ref >40)
LDL Cholesterol: 93 mg/dL (ref 0–99)
Total CHOL/HDL Ratio: 5.5 RATIO
Triglycerides: 93 mg/dL (ref <150)
VLDL: 19 mg/dL (ref 0–40)

## 2020-01-08 LAB — CBC
HCT: 27.7 % — ABNORMAL LOW (ref 36.0–46.0)
Hemoglobin: 9.4 g/dL — ABNORMAL LOW (ref 12.0–15.0)
MCH: 32.3 pg (ref 26.0–34.0)
MCHC: 33.9 g/dL (ref 30.0–36.0)
MCV: 95.2 fL (ref 80.0–100.0)
Platelets: 338 10*3/uL (ref 150–400)
RBC: 2.91 MIL/uL — ABNORMAL LOW (ref 3.87–5.11)
RDW: 13.3 % (ref 11.5–15.5)
WBC: 7 10*3/uL (ref 4.0–10.5)
nRBC: 0 % (ref 0.0–0.2)

## 2020-01-08 LAB — BASIC METABOLIC PANEL
Anion gap: 6 (ref 5–15)
BUN: 19 mg/dL (ref 6–20)
CO2: 21 mmol/L — ABNORMAL LOW (ref 22–32)
Calcium: 8.6 mg/dL — ABNORMAL LOW (ref 8.9–10.3)
Chloride: 112 mmol/L — ABNORMAL HIGH (ref 98–111)
Creatinine, Ser: 1.17 mg/dL — ABNORMAL HIGH (ref 0.44–1.00)
GFR calc Af Amer: 59 mL/min — ABNORMAL LOW (ref >60)
GFR calc non Af Amer: 51 mL/min — ABNORMAL LOW (ref >60)
Glucose, Bld: 157 mg/dL — ABNORMAL HIGH (ref 70–99)
Potassium: 5.1 mmol/L (ref 3.5–5.1)
Sodium: 139 mmol/L (ref 135–145)

## 2020-01-08 LAB — GLUCOSE, CAPILLARY
Glucose-Capillary: 101 mg/dL — ABNORMAL HIGH (ref 70–99)
Glucose-Capillary: 143 mg/dL — ABNORMAL HIGH (ref 70–99)
Glucose-Capillary: 204 mg/dL — ABNORMAL HIGH (ref 70–99)
Glucose-Capillary: 143 mg/dL — ABNORMAL HIGH (ref 70–99)

## 2020-01-08 LAB — VITAMIN B12: Vitamin B-12: 316 pg/mL (ref 180–914)

## 2020-01-08 LAB — URINE CULTURE

## 2020-01-08 MED ORDER — AMLODIPINE BESYLATE 5 MG PO TABS
5.0000 mg | ORAL_TABLET | Freq: Every day | ORAL | Status: DC
  Administered 2020-01-08 – 2020-01-09 (×2): 5 mg via ORAL
  Filled 2020-01-08 (×2): qty 1

## 2020-01-08 NOTE — Progress Notes (Signed)
Pt arrived to unit at 1845, pt walked over to chair. Pt stable, focused assessment charted.

## 2020-01-08 NOTE — Progress Notes (Signed)
No show

## 2020-01-08 NOTE — Progress Notes (Signed)
Occupational Therapy Evaluation Only Patient Details Name: Amy Nunez MRN: [id number] DOB: 25-Apr-1961 Today's Date: 01/08/2020    History of Present Illness Amy Nunez is a 59 y.o. female with medical history significant of schizophrenia/bipolar; opioid dependence/chronic pain; HTN; DM; grade 1 diastolic CHF; and renal cancer s/p remote R nephrectomy presenting to [facility] with dizziness.  She reports that she has been having panic and hurt her wrist.  She fell on Monday in hte bathroom at [facility] - she slipped.  She also had fallen off the bed at her daughter's house but didn't really herself.  Her equilibrium was off a little bit.     Clinical Impression   PTA pt lived with her daughter and grandchildren, independent in all ADL, IADL, and mobility tasks. Pt does not ambulate with an AD and reports 2 falls in the last 6 months. Pt able to ambulate around room and to/from bathroom independently with 0 instances of LOB. Pt completed LB dressing, toileting, grooming at the sink, and walk-in shower transfer independently demonstrating good balance and safety. No signs/symptoms of distress. Skilled OT services not warranted at this time as pt is functioning at/near baseline for self-care and mobility tasks. OT evaluation only.     Follow Up Recommendations  No OT follow up    Equipment Recommendations  None recommended by OT    Recommendations for Other Services       Precautions / Restrictions Precautions Precautions: Fall Precaution Comments: monitor HR Required Braces or Orthoses: Splint/Cast Splint/Cast: right wrist splint Restrictions Weight Bearing Restrictions: No      Mobility Bed Mobility Overal bed mobility: Modified Independent             General bed mobility comments: HOB elevated, use of bedrail  Transfers Overall transfer level: Independent Equipment used: None             General transfer comment: 0 instances of LOB    Balance Overall balance  assessment: Independent                                         ADL either performed or assessed with clinical judgement   ADL Overall ADL's : Independent;At baseline                                       General ADL Comments: Pt able to complete LB dressing, toileting, grooming/hygiene at the sink, and walk-in shower transfer independently. 0 instances of LOB.      Vision Baseline Vision/History: Wears glasses Wears Glasses: Reading only       Perception     Praxis      Pertinent Vitals/Pain Pain Assessment: 0-10 Pain Score: 8  Pain Location: Headache Pain Descriptors / Indicators: Aching Pain Intervention(s): Limited activity within patient's tolerance;Monitored during session;Repositioned;Premedicated before session     Hand Dominance Right   Extremity/Trunk Assessment Upper Extremity Assessment Upper Extremity Assessment: RUE deficits/detail;Generalized weakness RUE Deficits / Details: Weaker in right hand due to pain RUE Sensation: WNL   Lower Extremity Assessment Lower Extremity Assessment: Defer to PT evaluation       Communication Communication Communication: No difficulties   Cognition Arousal/Alertness: Awake/alert Behavior During Therapy: WFL for tasks assessed/performed Overall Cognitive Status: Within Functional Limits for tasks assessed  General Comments: Pt pleasant and willing to participate in therapy tasks   General Comments  VSS. No signs/symptoms of distress    Exercises     Shoulder Instructions      Home Living Family/patient expects to be discharged to:: Private residence Living Arrangements: Children(Daughter) Available Help at Discharge: Family;Available 24 hours/day(daughter works from home, grandson works at night) Type of Home: House Home Access: Level entry     [place]: Two level;Able to live on main level with bedroom/bathroom      Bathroom Shower/Tub: Tub/shower unit;Walk-in shower(Primarily uses walk-in shower)   Bathroom Toilet: Standard Bathroom Accessibility: Yes   Home Equipment: Bedside commode;Walker - 2 wheels   Additional Comments: pt lives with her daughter and 3 grandaughter (5, 1 in college), grandson (21)      Prior Functioning/Environment Level of Independence: Independent        Comments: Pt independent in all ADL, IADL, and mobility tasks. Pt does not ambulate with an AD and reports 2 falls in the last 6 months. Pt still drives.         OT Problem List:        OT Treatment/Interventions:      OT Goals(Current goals can be found in the care plan section) Acute Rehab OT Goals Patient Stated Goal: to go home  OT Frequency:     Barriers to D/C:            Co-evaluation              AM-PAC OT "6 Clicks" Daily Activity     Outcome Measure Help from another person eating meals?: None Help from another person taking care of personal grooming?: None Help from another person toileting, which includes using toliet, bedpan, or urinal?: None Help from another person bathing (including washing, rinsing, drying)?: None Help from another person to put on and taking off regular upper body clothing?: None Help from another person to put on and taking off regular lower body clothing?: None 6 Click Score: 24   End of Session Equipment Utilized During Treatment: Gait belt Nurse Communication: Mobility status  Activity Tolerance: Patient tolerated treatment well Patient left: in bed;with call bell/phone within reach  OT Visit Diagnosis: Pain Pain - Right/Left: (Anterior) Pain - part of body: (headache)                Time: 1100-1120 OT Time Calculation (min): 20 min Charges:  OT General Charges $OT Visit: 1 Visit OT Evaluation $OT Eval Low Complexity: 1 Low  Mauri Brooklyn OTR/L [phone]  Mauri Brooklyn 01/08/2020, 11:21 AM

## 2020-01-08 NOTE — Progress Notes (Signed)
PROGRESS NOTE    Amy Nunez    Code Status: Full Code  XH:[id number] DOB: 02/22/1961 DOA: 01/06/2020 LOS: 2 days  PCP: Einar Pheasant, DO CC: No chief complaint on file.      Hospital Summary   This is a 59 year old female with history of schizophrenia/bipolar disorder, opioid dependence and chronic pain with recent medication changes, hypertension, diabetes, HFpEF, renal cancer s/p remote right-sided nephrectomy who presented initially to [facility] with dizziness and multiple falls and questionably hitting her head over the past week after tripping and recent hospitalization on 4/15-22 for abdominal pain with nausea and vomiting thought to be from chronic opioid induced constipation.  ED course: Found to have SBP 60s improved to 90s with 2 L of IV fluids and received 1/3 L after that.  Noted to have leukocytosis with stable anemia and AKI with creatinine of 3.1 and an uric at presentation.  Initially thought to have hypotension secondary to polypharmacy.  Of note noted to have elevated D-dimer of 2.4 and sent to [facility] for PE with VQ scan though she was not hypoxic on room air.  VQ scan on 5/12 unremarkable.  Evaluated by PT without need for follow-up.  A & P   Principal Problem:   Hypotension Active Problems:   Type 2 diabetes mellitus (HCC)   Benign essential HTN   Acute kidney injury superimposed on chronic kidney disease (HCC)   Bipolar 1 disorder (HCC)   Renal cancer (HCC)   Chronic pain syndrome   Chronic diastolic CHF (congestive heart failure) (HCC)   Obesity (BMI 30.0-34.9)   1. Hypotension secondary to hypovolemia likely from poor p.o. intake and polypharmacy, now hypertensive a. Resolved with several boluses and maintenance IV fluids b. D-dimer elevated with negative VQ scan so unlikely from PE c. Home antihypertensives (amlodipine and lisinopril) held on admission-now hypertensive and will start on low-dose amlodipine and monitor  overnight d. Negative orthostatics today e. Discontinue lactated Ringer's as she is developing hyperkalemia and she is able to tolerate p.o. intake and monitor blood pressure overnight  2. Recurrent falls, suspect multifactorial: Polypharmacy in setting of AKI, hypotension, possibly component of peripheral neuropathy as well a. Evaluated by PT without any further follow-up needed b. GFR was 16 on admission and was taking gabapentin 800 mg 3 times daily which has since been decreased.  c. Continue medication reconciliation during hospitalization and at discharge d. will check a B12 level  3. AKI on CKD 3a with history of renal cancer s/p right nephrectomy a. AKI probably from hypotension and hypovolemia b. Creatinine 3.1 at presentation, currently 1.17-at/near baseline after IV fluids c. Hold lisinopril and meloxicam d. Renally dose medications  4. Diabetes a. HA1C 7.7 in April b. Continue Lantus and sliding scale  5. Headache with healing laceration on posterior scalp a. After discussion with daughter, patient recently had a wig which was causing discomfort and laceration to her scalp over the past week which is likely the cause of her headache b. CT head negative for acute pathology however did show generalized cerebral atrophy and microvascular disease changes c. Continue with her decreased home pain medications d. LDL 100 in January, will recheck lipid panel on Lipitor 80 mg and consider adding fenofibrate for stroke risk factor modification with goal LDL less than 70  6. Nausea and vomiting, probably medication induced in setting of AKI a. Continue to monitor  7. Chronic pain on chronic opiates a. High risk of opioid misuse, diversion and/or  overdose b. MS Contin decreased from 30 mg twice daily to 50 mg twice daily, Zanaflex changed to Robaxin, continued as needed Percocet c. Continue bowel regimen d. She will need very close follow-up and further med rec at discharge and  outpatient follow-up  8. Chronic diastolic heart failure not in acute exacerbation a. Monitor volume status after receiving IV fluids  9. Hypertension a. Home amlodipine and lisinopril held due to AKI and hypotension b. will start on lower dosed amlodipine 5 mg  10. Obesity  DVT prophylaxis: Heparin Family Communication: [name] has been updated  Disposition Plan:  Status is: Inpatient  Remains inpatient appropriate because:Monitoring blood pressure, falls and headache   Dispo: The patient is from: Home lives with daughter              Anticipated d/c is to: Home              Anticipated d/c date is: 1 day              Patient currently is not medically stable to d/c.           Pressure injury documentation    None  Consultants  None  Procedures  None  Antibiotics   Anti-infectives (From admission, onward)   None        Subjective   Patient is a poor historian and states that she had tripped at her daughter's house a week ago and was unsure if she hit her head at that time and states that she had gotten out of bed to go to the bathroom one night this week and had fallen again but unsure if this was going from sitting to standing, if she tripped or if she had lost consciousness.  Additionally she had a recurrent episode of fall at a [employer] sometime in the past couple days.  According to patient's daughter, she does not believe the patient did hit her head on any of these occasions.  Additionally, her daughter states that the patient had been using a new wig which was very tight on her scalp and causing discomfort and a laceration on her posterior scalp about 5 days ago.  Daughter very concerned about the patient's medication use and is on board with decreasing doses.  Patient is complaining of significant posterior headache in the area of her laceration.  No other issues.  Objective   Vitals:   01/07/20 2343 01/08/20 0801 01/08/20 1058 01/08/20  1641  BP: 129/90 136/84 (!) 152/99 (!) 151/88  Pulse: 98 96 87 86  Resp: 13 14 14 11   Temp:  98.7 F (37.1 C) 98.6 F (37 C) 98.5 F (36.9 C)  TempSrc:  Oral Oral Oral  SpO2: 98%  100%   Weight:      Height:        Intake/Output Summary (Last 24 hours) at 01/08/2020 1702 Last data filed at 01/08/2020 0600 Gross per 24 hour  Intake 1473.4 ml  Output 700 ml  Net 773.4 ml   Filed Weights   01/06/20 1905 01/07/20 0909  Weight: 86.2 kg 88.9 kg    Examination:  Physical Exam Vitals and nursing note reviewed.  Constitutional:      Appearance: She is obese. She is not ill-appearing.  HENT:     Head: Normocephalic.     Mouth/Throat:     Mouth: Mucous membranes are moist.  Eyes:     Extraocular Movements: Extraocular movements intact.     Conjunctiva/sclera: Conjunctivae normal.  Pupils: Pupils are equal, round, and reactive to light.  Cardiovascular:     Rate and Rhythm: Normal rate and regular rhythm.  Pulmonary:     Effort: Pulmonary effort is normal.     Breath sounds: Normal breath sounds.  Abdominal:     General: There is no distension.     Tenderness: There is no abdominal tenderness.  Musculoskeletal:     Cervical back: No rigidity.  Skin:    Coloration: Skin is not jaundiced or pale.  Neurological:     General: No focal deficit present.     Mental Status: She is alert.     Cranial Nerves: No dysarthria or facial asymmetry.     Coordination: Coordination is intact. Coordination normal. Finger-Nose-Finger Test normal.     Data Reviewed: I have personally reviewed following labs and imaging studies  CBC: Recent Labs  Lab 01/06/20 1949 01/07/20 0620 01/08/20 1009  WBC 10.5 10.1 7.0  NEUTROABS 7.9*  --   --   HGB 9.2* 9.6* 9.4*  HCT 27.1* 28.8* 27.7*  MCV 94.4 94.7 95.2  PLT 329 320 [id number]   Basic Metabolic Panel: Recent Labs  Lab 01/06/20 1949 01/07/20 1229 01/08/20 1009  NA 132* 137 139  K 4.7 5.1 5.1  CL 103 110 112*  CO2 18* 18* 21*   GLUCOSE 167* 189* 157*  BUN 50* 30* 19  CREATININE 3.10* 1.41* 1.17*  CALCIUM 8.5* 8.5* 8.6*   GFR: Estimated Creatinine Clearance: 56.3 mL/min (A) (by C-G formula based on SCr of 1.17 mg/dL (H)). Liver Function Tests: Recent Labs  Lab 01/06/20 1949  AST 25  ALT 19  ALKPHOS 108  BILITOT 0.5  PROT 7.0  ALBUMIN 3.5   No results for input(s): LIPASE, AMYLASE in the last 168 hours. No results for input(s): AMMONIA in the last 168 hours. Coagulation Profile: Recent Labs  Lab 01/06/20 1949  INR 1.0   Cardiac Enzymes: No results for input(s): CKTOTAL, CKMB, CKMBINDEX, TROPONINI in the last 168 hours. BNP (last 3 results) No results for input(s): PROBNP in the last 8760 hours. HbA1C: No results for input(s): HGBA1C in the last 72 hours. CBG: Recent Labs  Lab 01/07/20 1804 01/07/20 2122 01/08/20 0633 01/08/20 1059 01/08/20 1639  GLUCAP 140* 143* 101* 143* 204*   Lipid Profile: No results for input(s): CHOL, HDL, LDLCALC, TRIG, CHOLHDL, LDLDIRECT in the last 72 hours. Thyroid Function Tests: No results for input(s): TSH, T4TOTAL, FREET4, T3FREE, THYROIDAB in the last 72 hours. Anemia Panel: No results for input(s): VITAMINB12, FOLATE, FERRITIN, TIBC, IRON, RETICCTPCT in the last 72 hours. Sepsis Labs: Recent Labs  Lab 01/06/20 1949  LATICACIDVEN 1.0    Recent Results (from the past 240 hour(s))  Blood Culture (routine x 2)     Status: None (Preliminary result)   Collection Time: 01/06/20  8:30 PM   Specimen: BLOOD  Result Value Ref Range Status   Specimen Description   Final    BLOOD LEFT ANTECUBITAL Performed at [facility], [address] [place], [place] [address]    Special Requests   Final    BOTTLES DRAWN AEROBIC AND ANAEROBIC Blood Culture adequate volume Performed at [facility], [address] [place], [place] [address]    Culture   Final    NO GROWTH 2 DAYS Performed at [facility], [place] [address],  [place], [place] [address]    Report Status PENDING  Incomplete  SARS Coronavirus 2 by RT  PCR (hospital order, performed in [facility] hospital lab) Nasopharyngeal Nasopharyngeal Swab     Status: None   Collection Time: 01/06/20  9:27 PM   Specimen: Nasopharyngeal Swab  Result Value Ref Range Status   SARS Coronavirus 2 NEGATIVE NEGATIVE Final    Comment: (NOTE) SARS-CoV-2 target nucleic acids are NOT DETECTED. The SARS-CoV-2 RNA is generally detectable in upper and lower respiratory specimens during the acute phase of infection. The lowest concentration of SARS-CoV-2 viral copies this assay can detect is 250 copies / mL. A negative result does not preclude SARS-CoV-2 infection and should not be used as the sole basis for treatment or other patient management decisions.  A negative result may occur with improper specimen collection / handling, submission of specimen other than nasopharyngeal swab, presence of viral mutation(s) within the areas targeted by this assay, and inadequate number of viral copies (<250 copies / mL). A negative result must be combined with clinical observations, patient history, and epidemiological information. Fact Sheet for Patients:   StrictlyIdeas.no Fact Sheet for Healthcare Providers: BankingDealers.co.za This test is not yet approved or cleared  by the [place] FDA and has been authorized for detection and/or diagnosis of SARS-CoV-2 by FDA under an Emergency Use Authorization (EUA).  This EUA will remain in effect (meaning this test can be used) for the duration of the COVID-19 declaration under Section 564(b)(1) of the Act, 21 U.S.C. section 360bbb-3(b)(1), unless the authorization is terminated or revoked sooner. Performed at [facility], [address] [place], [place] [address]   Urine culture     Status: Abnormal   Collection Time: 01/07/20 12:25 AM   Specimen: In/Out Cath Urine   Result Value Ref Range Status   Specimen Description   Final    IN/OUT CATH URINE Performed at [facility], [address] [place], [place] [address]    Special Requests   Final    NONE Performed at [facility], [address] [place], [place] [address]    Culture MULTIPLE SPECIES PRESENT, SUGGEST RECOLLECTION (A)  Final   Report Status 01/08/2020 FINAL  Final         Radiology Studies: DG Wrist Complete Right  Result Date: 01/06/2020 CLINICAL DATA:  [place], right wrist pain EXAM: RIGHT WRIST - COMPLETE 3+ VIEW COMPARISON:  None. FINDINGS: Frontal, oblique, lateral, and ulnar deviated views of the right wrist are obtained. No fracture, subluxation, or dislocation. Joint spaces are well preserved. Soft tissues are normal. IMPRESSION: 1. Unremarkable right wrist. Electronically Signed   By: Randa Ngo M.D.   On: 01/06/2020 20:08   CT Head Wo Contrast  Result Date: 01/06/2020 CLINICAL DATA:  Status post recent fall. EXAM: CT HEAD WITHOUT CONTRAST TECHNIQUE: Contiguous axial images were obtained from the base of the skull through the vertex without intravenous contrast. COMPARISON:  September 01, 2019. FINDINGS: Brain: There is mild cerebral atrophy with widening of the extra-axial spaces and ventricular dilatation. There are areas of decreased attenuation within the white matter tracts of the supratentorial brain, consistent with microvascular disease changes. A small chronic right basal ganglia lacunar infarct is seen. Vascular: No hyperdense vessel or unexpected calcification. Skull: Normal. Negative for fracture or focal lesion. Sinuses/Orbits: No acute finding. Other: None. IMPRESSION: 1. Generalized cerebral atrophy. 2. No acute intracranial abnormality. Electronically Signed   By: Virgina Norfolk M.D.   On: 01/06/2020 20:09   CT Cervical Spine Wo Contrast  Result Date: 01/06/2020 CLINICAL DATA:  Status post fall 2 days ago. EXAM: CT CERVICAL SPINE WITHOUT  CONTRAST TECHNIQUE: Multidetector CT imaging of the cervical spine was performed without intravenous contrast. Multiplanar CT image reconstructions were also generated. COMPARISON:  September 01, 2019 FINDINGS: Alignment: Normal. Skull base and vertebrae: No acute fracture. No primary bone lesion or focal pathologic process. Soft tissues and spinal canal: No prevertebral fluid or swelling. No visible canal hematoma. Disc levels: There is moderate severity multilevel endplate sclerosis with moderate to marked severity multilevel intervertebral disc space narrowing. Upper chest: Negative. Other: None. IMPRESSION: 1. No acute fracture or dislocation of the cervical spine. 2. Moderate to marked severity multilevel degenerative changes. Electronically Signed   By: Virgina Norfolk M.D.   On: 01/06/2020 20:17   NM Pulmonary Perf and Vent  Result Date: 01/07/2020 CLINICAL DATA:  Dizziness. EXAM: NUCLEAR MEDICINE PERFUSION LUNG SCAN TECHNIQUE: Perfusion images were obtained in multiple projections after intravenous injection of Tc-8m MAA. RADIOPHARMACEUTICALS:  1.6 mCi Tc38m MAA IV COMPARISON:  None. FINDINGS: Perfusion: No wedge shaped peripheral perfusion defects to suggest acute pulmonary embolism. IMPRESSION: Normal nuclear medicine perfusion lung scan. Electronically Signed   By: Virgina Norfolk M.D.   On: 01/07/2020 17:02   DG Chest Port 1 View  Result Date: 01/06/2020 CLINICAL DATA:  Altered level of consciousness, hypotension, fell EXAM: PORTABLE CHEST 1 VIEW COMPARISON:  12/14/2019 FINDINGS: The heart size and mediastinal contours are within normal limits. Both lungs are clear. The visualized skeletal structures are unremarkable. IMPRESSION: No active disease. Electronically Signed   By: Randa Ngo M.D.   On: 01/06/2020 20:07   CT Renal Stone Study  Result Date: 01/06/2020 CLINICAL DATA:  Status post fall 2 days ago. EXAM: CT ABDOMEN AND PELVIS WITHOUT CONTRAST TECHNIQUE: Multidetector CT imaging  of the abdomen and pelvis was performed following the standard protocol without IV contrast. COMPARISON:  CT abdomen and pelvis 12/11/2019. FINDINGS: Lower chest: Lung bases clear. Hepatobiliary: Minimal dependent atelectasis. Lung bases otherwise clear. No pleural or pericardial effusion. Pancreas: Unremarkable. No pancreatic ductal dilatation or surrounding inflammatory changes. Spleen: Normal in size without focal abnormality. Adrenals/Urinary Tract: The adrenal glands appear normal. Status post right nephrectomy. A cyst in the lower pole of the left kidney is unchanged. Urinary bladder appears normal. Stomach/Bowel: Stomach is within normal limits. Appendix appears normal. Status post appendectomy. No evidence of bowel wall thickening, distention, or inflammatory changes. Small hiatal hernia and scattered diverticulosis without diverticulitis noted. Vascular/Lymphatic: Aortic atherosclerosis. No enlarged abdominal or pelvic lymph nodes. Reproductive: Status post hysterectomy. No adnexal masses. Other: None. Musculoskeletal: No acute or focal abnormality. Degenerative disc disease lower lumbar spine noted. IMPRESSION: No acute abnormality abdomen or pelvis. Aortic Atherosclerosis (ICD10-I70.0). Small hiatal hernia. Diverticulosis without diverticulitis. Electronically Signed   By: Inge Rise M.D.   On: 01/06/2020 21:20        Scheduled Meds: . atorvastatin  80 mg Oral Daily  . docusate sodium  100 mg Oral BID  . gabapentin  600 mg Oral TID  . heparin injection (subcutaneous)  5,000 Units Subcutaneous Q8H  . insulin aspart  0-5 Units Subcutaneous QHS  . insulin aspart  0-9 Units Subcutaneous TID WC  . insulin glargine  5 Units Subcutaneous QHS  . morphine  15 mg Oral Q12H  . pantoprazole  40 mg Oral BID AC  . QUEtiapine  50 mg Oral QHS  . saccharomyces boulardii  250 mg Oral BID  . sodium chloride flush  3 mL Intravenous Q12H  . traZODone  100 mg Oral QHS   Continuous  Infusions:   Time spent: 35 minutes with over 50% of the time coordinating the patient's care    Harold Hedge, DO [facility] Pager [phone]  Call night coverage person covering after 7pm

## 2020-01-09 ENCOUNTER — Inpatient Hospital Stay (HOSPITAL_COMMUNITY): Payer: Medicare HMO

## 2020-01-09 DIAGNOSIS — R519 Headache, unspecified: Secondary | ICD-10-CM

## 2020-01-09 DIAGNOSIS — R609 Edema, unspecified: Secondary | ICD-10-CM

## 2020-01-09 DIAGNOSIS — M7989 Other specified soft tissue disorders: Secondary | ICD-10-CM

## 2020-01-09 LAB — BASIC METABOLIC PANEL
Anion gap: 9 (ref 5–15)
Anion gap: 9 (ref 5–15)
BUN: 15 mg/dL (ref 6–20)
BUN: 14 mg/dL (ref 6–20)
CO2: 21 mmol/L — ABNORMAL LOW (ref 22–32)
CO2: 20 mmol/L — ABNORMAL LOW (ref 22–32)
Calcium: 8.3 mg/dL — ABNORMAL LOW (ref 8.9–10.3)
Calcium: 8.8 mg/dL — ABNORMAL LOW (ref 8.9–10.3)
Chloride: 108 mmol/L (ref 98–111)
Chloride: 110 mmol/L (ref 98–111)
Creatinine, Ser: 1.21 mg/dL — ABNORMAL HIGH (ref 0.44–1.00)
Creatinine, Ser: 1.23 mg/dL — ABNORMAL HIGH (ref 0.44–1.00)
GFR calc Af Amer: 56 mL/min — ABNORMAL LOW (ref >60)
GFR calc Af Amer: 55 mL/min — ABNORMAL LOW (ref >60)
GFR calc non Af Amer: 49 mL/min — ABNORMAL LOW (ref >60)
GFR calc non Af Amer: 48 mL/min — ABNORMAL LOW (ref >60)
Glucose, Bld: 211 mg/dL — ABNORMAL HIGH (ref 70–99)
Glucose, Bld: 166 mg/dL — ABNORMAL HIGH (ref 70–99)
Potassium: 5.2 mmol/L — ABNORMAL HIGH (ref 3.5–5.1)
Potassium: 4.9 mmol/L (ref 3.5–5.1)
Sodium: 138 mmol/L (ref 135–145)
Sodium: 139 mmol/L (ref 135–145)

## 2020-01-09 LAB — CBC
HCT: 27.4 % — ABNORMAL LOW (ref 36.0–46.0)
Hemoglobin: 9.1 g/dL — ABNORMAL LOW (ref 12.0–15.0)
MCH: 31.9 pg (ref 26.0–34.0)
MCHC: 33.2 g/dL (ref 30.0–36.0)
MCV: 96.1 fL (ref 80.0–100.0)
Platelets: 318 10*3/uL (ref 150–400)
RBC: 2.85 MIL/uL — ABNORMAL LOW (ref 3.87–5.11)
RDW: 13.1 % (ref 11.5–15.5)
WBC: 7.1 10*3/uL (ref 4.0–10.5)
nRBC: 0 % (ref 0.0–0.2)

## 2020-01-09 LAB — GLUCOSE, CAPILLARY
Glucose-Capillary: 94 mg/dL (ref 70–99)
Glucose-Capillary: 206 mg/dL — ABNORMAL HIGH (ref 70–99)
Glucose-Capillary: 124 mg/dL — ABNORMAL HIGH (ref 70–99)
Glucose-Capillary: 143 mg/dL — ABNORMAL HIGH (ref 70–99)

## 2020-01-09 LAB — MAGNESIUM
Magnesium: 0.8 mg/dL — CL (ref 1.7–2.4)
Magnesium: 1.7 mg/dL (ref 1.7–2.4)

## 2020-01-09 MED ORDER — MAGNESIUM SULFATE 4 GM/100ML IV SOLN
4.0000 g | Freq: Once | INTRAVENOUS | Status: AC
  Administered 2020-01-09: 4 g via INTRAVENOUS
  Filled 2020-01-09: qty 100

## 2020-01-09 MED ORDER — FENOFIBRATE 54 MG PO TABS
54.0000 mg | ORAL_TABLET | Freq: Every day | ORAL | Status: DC
  Administered 2020-01-09 – 2020-01-10 (×2): 54 mg via ORAL
  Filled 2020-01-09 (×3): qty 1

## 2020-01-09 MED ORDER — AMLODIPINE BESYLATE 10 MG PO TABS
10.0000 mg | ORAL_TABLET | Freq: Every day | ORAL | Status: DC
  Administered 2020-01-10: 10 mg via ORAL
  Filled 2020-01-09: qty 1

## 2020-01-09 MED ORDER — MORPHINE SULFATE (PF) 2 MG/ML IV SOLN
1.0000 mg | Freq: Once | INTRAVENOUS | Status: AC
  Administered 2020-01-09: 1 mg via INTRAVENOUS
  Filled 2020-01-09: qty 1

## 2020-01-09 MED ORDER — SODIUM CHLORIDE 0.9 % IV SOLN: INTRAVENOUS | Status: AC

## 2020-01-09 MED ORDER — METOCLOPRAMIDE HCL 5 MG/ML IJ SOLN
10.0000 mg | Freq: Once | INTRAMUSCULAR | Status: AC
  Administered 2020-01-09: 10 mg via INTRAVENOUS
  Filled 2020-01-09: qty 2

## 2020-01-09 NOTE — TOC Initial Note (Signed)
Transition of Care [facility]) - Initial/Assessment Note    Patient Details  Name: Amy Nunez MRN: [id number] Date of Birth: 06/12/1961  [facility]) CM/SW Contact:    Marilu Favre, RN Phone Number: 01/09/2020, 11:50 AM  Clinical Narrative:                 Patient from home with daughter, does not use DME.   Has PCP , transportation to appointments and can have scripts filled.   Will continue to follow.  Expected Discharge Plan: Home/Self Care Barriers to Discharge: Continued Medical Work up   Patient Goals and CMS Choice Patient states their goals for this hospitalization and ongoing recovery are:: to return to home CMS Medicare.gov Compare Post Acute Care list provided to:: Patient Choice offered to / list presented to : NA  Expected Discharge Plan and Services Expected Discharge Plan: Home/Self Care   Discharge Planning Services: CM Consult   Living arrangements for the past 2 months: Single Family Home                 DME Arranged: N/A         HH Arranged: NA          Prior Living Arrangements/Services Living arrangements for the past 2 months: Single Family Home Lives with:: Adult Children Patient language and need for interpreter reviewed:: Yes Do you feel safe going back to the place where you live?: Yes      Need for Family Participation in Patient Care: Yes (Comment) Care giver support system in place?: Yes (comment)   Criminal Activity/Legal Involvement Pertinent to Current Situation/Hospitalization: No - Comment as needed  Activities of Daily Living Home Assistive Devices/Equipment: Eyeglasses ADL Screening (condition at time of admission) Patient's cognitive ability adequate to safely complete daily activities?: Yes Is the patient deaf or have difficulty hearing?: No Does the patient have difficulty seeing, even when wearing glasses/contacts?: No Does the patient have difficulty concentrating, remembering, or making  decisions?: No Patient able to express need for assistance with ADLs?: Yes Does the patient have difficulty dressing or bathing?: No Independently performs ADLs?: Yes (appropriate for developmental age) Does the patient have difficulty walking or climbing stairs?: Yes Weakness of Legs: Both Weakness of Arms/Hands: None  Permission Sought/Granted   Permission granted to share information with : No              Emotional Assessment Appearance:: Appears stated age Attitude/Demeanor/Rapport: Engaged Affect (typically observed): Accepting Orientation: : Oriented to Self, Oriented to Place, Oriented to  Time Alcohol / Substance Use: Not Applicable Psych Involvement: No (comment)  Admission diagnosis:  AKI (acute kidney injury) ([place]) [N17.9] D-dimer, elevated [R79.89] Hypotension [I95.9] Hypotension, unspecified hypotension type [I95.9] Patient Active Problem List   Diagnosis Date Noted  . Bipolar 1 disorder ([place]) 01/07/2020  . Renal cancer ([place]) 01/07/2020  . Chronic pain syndrome 01/07/2020  . Chronic diastolic CHF (congestive heart failure) ([place]) 01/07/2020  . Obesity (BMI 30.0-34.9) 01/07/2020  . Hypotension 01/06/2020  . SIRS (systemic inflammatory response syndrome) ([place]) 12/12/2019  . Nausea vomiting and diarrhea 12/12/2019  . Intractable nausea and vomiting 12/12/2019  . Appendicitis 11/03/2019  . Acute appendicitis 11/03/2019  . AKI (acute kidney injury) ([place]) 09/19/2019  . Acute kidney injury superimposed on chronic kidney disease ([place]) 09/18/2019  . Abdominal pain 09/03/2019  . Type 2 diabetes mellitus ([place]) 09/03/2019  . Benign essential HTN 09/03/2019  . Irritable bowel syndrome (IBS) 09/03/2019  . Left-sided weakness 09/01/2019  PCP:  Einar Pheasant, DO Pharmacy:   [facility] [phone] - [place], [place] - [address] AT [place] [address] [place] [address] Phone: [phone] Fax: [phone]     Social  Determinants of Health (SDOH) Interventions    Readmission Risk Interventions Readmission Risk Prevention Plan 01/09/2020 12/17/2019 12/15/2019  Transportation Screening Complete - Complete  Medication Review (RN Care Manager) Referral to Pharmacy - Complete  PCP or Specialist appointment within 3-5 days of discharge Complete Complete -  [place] or Home Care Consult Complete - Complete  [place] or Home Care Consult Pt Refusal Comments - - no needs identified  SW Recovery Care/Counseling Consult - - Complete  Palliative Care Screening - - Not [place] - - Not Applicable  Some recent data might be hidden

## 2020-01-09 NOTE — Progress Notes (Signed)
Lower extremity venous has been completed.   Preliminary results in CV Proc.   Abram Sander 01/09/2020 12:06 PM

## 2020-01-09 NOTE — Progress Notes (Signed)
CRITICAL VALUE ALERT  Critical Value: Magnesium=0.8  Date & Time Notied:  01-09-20 @0359   Provider Notified: T. Opyd  Orders Received/Actions taken: Give IV Mg and place pt on tele. RN will continue to monitor pt.

## 2020-01-09 NOTE — Progress Notes (Signed)
PROGRESS NOTE    Amy Nunez    Code Status: Full Code  XH:[id number] DOB: 1961/08/12 DOA: 01/06/2020 LOS: 3 days  PCP: Einar Pheasant, DO CC: No chief complaint on file.      Hospital Summary   This is a 59 year old female with history of schizophrenia/bipolar disorder, opioid dependence and chronic pain with recent medication changes, hypertension, diabetes, HFpEF, renal cancer s/p remote right-sided nephrectomy who presented initially to [facility] with dizziness and multiple falls and questionably hitting her head over the past week after tripping and recent hospitalization on 4/15-22 for abdominal pain with nausea and vomiting thought to be from chronic opioid induced constipation.  ED course: Found to have SBP 60s improved to 90s with 2 L of IV fluids and received 1/3 L after that.  Noted to have leukocytosis with stable anemia and AKI with creatinine of 3.1 and an uric at presentation.  Initially thought to have hypotension secondary to polypharmacy.  Of note noted to have elevated D-dimer of 2.4 and sent to [facility] for valuation for PE with VQ scan though she was not hypoxic on room air.  VQ scan on 5/12 unremarkable.  Evaluated by PT without need for follow-up.  A & P   Principal Problem:   Hypotension Active Problems:   Type 2 diabetes mellitus (HCC)   Benign essential HTN   Acute kidney injury superimposed on chronic kidney disease (HCC)   Bipolar 1 disorder (HCC)   Renal cancer (HCC)   Chronic pain syndrome   Chronic diastolic CHF (congestive heart failure) (HCC)   Obesity (BMI 30.0-34.9)   1. Hypotension secondary to hypovolemia likely from poor p.o. intake and polypharmacy, resolved a. D-dimer elevated with negative VQ scan  b. Home antihypertensives (amlodipine and lisinopril) held on admission, restarted on amlodipine 5 mg and tolerated, will increase to 10 mg (home dose)  2. Recurrent falls, suspect multifactorial: Polypharmacy in setting of AKI, hypotension,  possibly component of peripheral neuropathy as well a. Evaluated by PT without any further follow-up needed b. GFR was 16 on admission and was taking gabapentin 800 mg 3 times daily which has since been decreased.  c. Continue medication reconciliation during hospitalization and at discharge d. Borderline low vitamin B12 level, check MMA  3. AKI on CKD 3a with history of renal cancer s/p right nephrectomy a. AKI probably from hypotension and hypovolemia b. Creatinine 3.1 -> 1.17 -> 1.21  c. Will give additional NS today, hold LR in setting of hyperkalemia d. Hold lisinopril and meloxicam e. Renally dose medications  4. Diabetes a. HA1C 7.7 in April b. Continue Lantus and sliding scale  5. Intractable headache of unknown etiology, possibly from hypomagnesemia vs overuse headache vs migraine  a. CT head negative for acute pathology however did show generalized cerebral atrophy and microvascular disease changes b. Discussed with neurology over the phone: Given hypotension, nausea/vomiting and elevated D-dimer with persistent pain MRI brain/MR venogram was ordered today to rule out venous thrombosis or other underlying pathology which were both negative c. Will give a dose of Reglan today and treat hypomagnesemia and if no improvement tomorrow we will formally consult neurology  6. Hyperlipidemia with microvascular disease of brain  a. LDL 93 on Lipitor 80 mg b. will start on fibrate for goal LDL less than 70  7. Chronic pain on chronic opiates a. High risk of opioid misuse, diversion and/or overdose b. MS Contin decreased from 30 mg twice daily to 50 mg twice daily, Zanaflex changed  to Robaxin, continued as needed Percocet c. Continue bowel regimen d. She will need very close follow-up and further med rec at discharge and outpatient follow-up  8. Chronic diastolic heart failure not in acute exacerbation a. Monitor volume status after receiving IV  fluids  9. Hypomagnesemia a. Magnesium 0.8 this a.m., received 4 g mag sulfate b. Follow-up magnesium this afternoon  10. Hypertension a. Home amlodipine and lisinopril held due to AKI and hypotension b. Increase amlodipine to 10 mg  11. Obesity  DVT prophylaxis: Heparin Family Communication: Called patient's daughter without response Disposition Plan:  Status is: Inpatient  Remains inpatient appropriate because:Treating intractable headache and hypomagnesemia   Dispo: The patient is from: Home lives with daughter              Anticipated d/c is to: Home              Anticipated d/c date is: 1 day              Patient currently is not medically stable to d/c.     Pressure injury documentation    None  Consultants  Discussed with neurology over the phone  Procedures  None  Antibiotics   Anti-infectives (From admission, onward)   None        Subjective   Patient with reported persistent severe headache located on the top of the scalp and posterior scalp in same places yesterday and not improved with her pain regimen.  Denies phonophobia, photophobia, change in vision or any other neurologic deficit.  Otherwise admits to chronic right foot paresthesias.  Discussed with neurology over the phone who recommended checking for venous thrombosis and giving Reglan.  No other issues.  Objective   Vitals:   01/09/20 0302 01/09/20 0620 01/09/20 1215 01/09/20 1423  BP: (!) 139/93 125/83 130/86 (!) 144/84  Pulse: 70 79 93 88  Resp: 15 18 18 18   Temp: 98.6 F (37 C) 98.3 F (36.8 C) 98.4 F (36.9 C) 98.5 F (36.9 C)  TempSrc: Oral Oral Oral Oral  SpO2: 99% 100% 100% 100%  Weight:      Height:        Intake/Output Summary (Last 24 hours) at 01/09/2020 1459 Last data filed at 01/09/2020 1430 Gross per 24 hour  Intake 840 ml  Output --  Net 840 ml   Filed Weights   01/06/20 1905 01/07/20 0909  Weight: 86.2 kg 88.9 kg    Examination:  Physical Exam Vitals  and nursing note reviewed.  HENT:     Head: Normocephalic.     Comments: 2 healing lacerations without evidence of cellulitis on posterior scalp no other abnormal findings on scalp Eyes:     Conjunctiva/sclera: Conjunctivae normal.     Pupils: Pupils are equal, round, and reactive to light.  Cardiovascular:     Rate and Rhythm: Normal rate and regular rhythm.  Pulmonary:     Effort: Pulmonary effort is normal.     Breath sounds: Normal breath sounds.  Abdominal:     General: Abdomen is flat.     Palpations: Abdomen is soft.  Musculoskeletal:        General: No swelling or tenderness. Normal range of motion.  Neurological:     General: No focal deficit present.     Mental Status: She is alert.  Psychiatric:        Mood and Affect: Mood normal.        Behavior: Behavior normal.     Data  Reviewed: I have personally reviewed following labs and imaging studies  CBC: Recent Labs  Lab 01/06/20 1949 01/07/20 0620 01/08/20 1009 01/09/20 0141  WBC 10.5 10.1 7.0 7.1  NEUTROABS 7.9*  --   --   --   HGB 9.2* 9.6* 9.4* 9.1*  HCT 27.1* 28.8* 27.7* 27.4*  MCV 94.4 94.7 95.2 96.1  PLT 329 320 338 0000000   Basic Metabolic Panel: Recent Labs  Lab 01/06/20 1949 01/07/20 1229 01/08/20 1009 01/09/20 0141  NA 132* 137 139 138  K 4.7 5.1 5.1 5.2*  CL 103 110 112* 108  CO2 18* 18* 21* 21*  GLUCOSE 167* 189* 157* 211*  BUN 50* 30* 19 15  CREATININE 3.10* 1.41* 1.17* 1.21*  CALCIUM 8.5* 8.5* 8.6* 8.3*  MG  --   --   --  0.8*   GFR: Estimated Creatinine Clearance: 54.5 mL/min (A) (by C-G formula based on SCr of 1.21 mg/dL (H)). Liver Function Tests: Recent Labs  Lab 01/06/20 1949  AST 25  ALT 19  ALKPHOS 108  BILITOT 0.5  PROT 7.0  ALBUMIN 3.5   No results for input(s): LIPASE, AMYLASE in the last 168 hours. No results for input(s): AMMONIA in the last 168 hours. Coagulation Profile: Recent Labs  Lab 01/06/20 1949  INR 1.0   Cardiac Enzymes: No results for input(s):  CKTOTAL, CKMB, CKMBINDEX, TROPONINI in the last 168 hours. BNP (last 3 results) No results for input(s): PROBNP in the last 8760 hours. HbA1C: No results for input(s): HGBA1C in the last 72 hours. CBG: Recent Labs  Lab 01/08/20 1059 01/08/20 1639 01/08/20 2112 01/09/20 0739 01/09/20 1200  GLUCAP 143* 204* 143* 94 206*   Lipid Profile: Recent Labs    01/08/20 1800  CHOL 137  HDL 25*  LDLCALC 93  TRIG 93  CHOLHDL 5.5   Thyroid Function Tests: No results for input(s): TSH, T4TOTAL, FREET4, T3FREE, THYROIDAB in the last 72 hours. Anemia Panel: Recent Labs    01/08/20 1810  VITAMINB12 316   Sepsis Labs: Recent Labs  Lab 01/06/20 1949  LATICACIDVEN 1.0    Recent Results (from the past 240 hour(s))  Blood Culture (routine x 2)     Status: None (Preliminary result)   Collection Time: 01/06/20  8:30 PM   Specimen: BLOOD  Result Value Ref Range Status   Specimen Description   Final    BLOOD LEFT ANTECUBITAL Performed at [facility], [address] [place], [place] [address]    Special Requests   Final    BOTTLES DRAWN AEROBIC AND ANAEROBIC Blood Culture adequate volume Performed at [facility], [address], [place], [place] [address]    Culture   Final    NO GROWTH 3 DAYS Performed at [facility], [place] [address] [place],  [address]    Report Status PENDING  Incomplete  SARS Coronavirus 2 by RT PCR (hospital order, performed in [facility] hospital lab) Nasopharyngeal Nasopharyngeal Swab     Status: None   Collection Time: 01/06/20  9:27 PM   Specimen: Nasopharyngeal Swab  Result Value Ref Range Status   SARS Coronavirus 2 NEGATIVE NEGATIVE Final    Comment: (NOTE) SARS-CoV-2 target nucleic acids are NOT DETECTED. The SARS-CoV-2 RNA is generally detectable in upper and lower respiratory specimens during the acute phase of infection. The lowest concentration of SARS-CoV-2 viral copies this assay can detect is  250 copies / mL. A negative result does not preclude  SARS-CoV-2 infection and should not be used as the sole basis for treatment or other patient management decisions.  A negative result may occur with improper specimen collection / handling, submission of specimen other than nasopharyngeal swab, presence of viral mutation(s) within the areas targeted by this assay, and inadequate number of viral copies (<250 copies / mL). A negative result must be combined with clinical observations, patient history, and epidemiological information. Fact Sheet for Patients:   StrictlyIdeas.no Fact Sheet for Healthcare Providers: BankingDealers.co.za This test is not yet approved or cleared  by the [place] FDA and has been authorized for detection and/or diagnosis of SARS-CoV-2 by FDA under an Emergency Use Authorization (EUA).  This EUA will remain in effect (meaning this test can be used) for the duration of the COVID-19 declaration under Section 564(b)(1) of the Act, 21 U.S.C. section 360bbb-3(b)(1), unless the authorization is terminated or revoked sooner. Performed at [facility], [address] [place], [place] [address]   Urine culture     Status: Abnormal   Collection Time: 01/07/20 12:25 AM   Specimen: In/Out Cath Urine  Result Value Ref Range Status   Specimen Description   Final    IN/OUT CATH URINE Performed at [facility], [address] [place], [place] [address]    Special Requests   Final    NONE Performed at [facility], [address] [place], [place] [address]    Culture MULTIPLE SPECIES PRESENT, SUGGEST RECOLLECTION (A)  Final   Report Status 01/08/2020 FINAL  Final         Radiology Studies: NM Pulmonary Perf and Vent  Result Date: 01/07/2020 CLINICAL DATA:  Dizziness. EXAM: NUCLEAR MEDICINE PERFUSION LUNG SCAN TECHNIQUE: Perfusion images were obtained in multiple projections  after intravenous injection of Tc-80m MAA. RADIOPHARMACEUTICALS:  1.6 mCi Tc29m MAA IV COMPARISON:  None. FINDINGS: Perfusion: No wedge shaped peripheral perfusion defects to suggest acute pulmonary embolism. IMPRESSION: Normal nuclear medicine perfusion lung scan. Electronically Signed   By: Virgina Norfolk M.D.   On: 01/07/2020 17:02   VAS [place] LOWER EXTREMITY VENOUS (DVT)  Result Date: 01/09/2020  Lower Venous DVTStudy Indications: Swelling, and Edema.  Comparison Study: no prior Performing Technologist: Abram Sander RVS  Examination Guidelines: A complete evaluation includes B-mode imaging, spectral Doppler, color Doppler, and power Doppler as needed of all accessible portions of each vessel. Bilateral testing is considered an integral part of a complete examination. Limited examinations for reoccurring indications may be performed as noted. The reflux portion of the exam is performed with the patient in reverse Trendelenburg.  +---------+---------------+---------+-----------+----------+--------------+ RIGHT    CompressibilityPhasicitySpontaneityPropertiesThrombus Aging +---------+---------------+---------+-----------+----------+--------------+ CFV      Full           Yes      Yes                                 +---------+---------------+---------+-----------+----------+--------------+ SFJ      Full                                                        +---------+---------------+---------+-----------+----------+--------------+ FV Prox  Full                                                        +---------+---------------+---------+-----------+----------+--------------+  FV Mid   Full                                                        +---------+---------------+---------+-----------+----------+--------------+ FV DistalFull                                                        +---------+---------------+---------+-----------+----------+--------------+ PFV       Full                                                        +---------+---------------+---------+-----------+----------+--------------+ POP      Full           Yes      Yes                                 +---------+---------------+---------+-----------+----------+--------------+ PTV      Full                                                        +---------+---------------+---------+-----------+----------+--------------+ PERO     Full                                                        +---------+---------------+---------+-----------+----------+--------------+   +---------+---------------+---------+-----------+----------+--------------+ LEFT     CompressibilityPhasicitySpontaneityPropertiesThrombus Aging +---------+---------------+---------+-----------+----------+--------------+ CFV      Full           Yes      Yes                                 +---------+---------------+---------+-----------+----------+--------------+ SFJ      Full                                                        +---------+---------------+---------+-----------+----------+--------------+ FV Prox  Full                                                        +---------+---------------+---------+-----------+----------+--------------+ FV Mid   Full                                                        +---------+---------------+---------+-----------+----------+--------------+  FV DistalFull                                                        +---------+---------------+---------+-----------+----------+--------------+ PFV      Full                                                        +---------+---------------+---------+-----------+----------+--------------+ POP      Full           Yes      Yes                                 +---------+---------------+---------+-----------+----------+--------------+ PTV      Full                                                         +---------+---------------+---------+-----------+----------+--------------+ PERO     Full                                                        +---------+---------------+---------+-----------+----------+--------------+     Summary: BILATERAL: - No evidence of deep vein thrombosis seen in the lower extremities, bilaterally. -No evidence of popliteal cyst, bilaterally.   *See table(s) above for measurements and observations.    Preliminary         Scheduled Meds: . amLODipine  5 mg Oral Daily  . atorvastatin  80 mg Oral Daily  . docusate sodium  100 mg Oral BID  . gabapentin  600 mg Oral TID  . heparin injection (subcutaneous)  5,000 Units Subcutaneous Q8H  . insulin aspart  0-5 Units Subcutaneous QHS  . insulin aspart  0-9 Units Subcutaneous TID WC  . insulin glargine  5 Units Subcutaneous QHS  . morphine  15 mg Oral Q12H  . pantoprazole  40 mg Oral BID AC  . QUEtiapine  50 mg Oral QHS  . saccharomyces boulardii  250 mg Oral BID  . sodium chloride flush  3 mL Intravenous Q12H  . traZODone  100 mg Oral QHS   Continuous Infusions:   Time spent: 36 minutes with over 50% of the time coordinating the patient's care    Harold Hedge, DO [facility] Pager [phone]  Call night coverage person covering after 7pm

## 2020-01-09 NOTE — Plan of Care (Signed)
  Problem: Education: Goal: Knowledge of General Education information will improve Description: Including pain rating scale, medication(s)/side effects and non-pharmacologic comfort measures Outcome: Progressing   Problem: Education: Goal: Knowledge of General Education information will improve Description: Including pain rating scale, medication(s)/side effects and non-pharmacologic comfort measures Outcome: Progressing   Problem: Health Behavior/Discharge Planning: Goal: Ability to manage health-related needs will improve Outcome: Progressing   Problem: Clinical Measurements: Goal: Respiratory complications will improve Outcome: Progressing Goal: Cardiovascular complication will be avoided Outcome: Progressing   Problem: Activity: Goal: Risk for activity intolerance will decrease Outcome: Progressing   Problem: Nutrition: Goal: Adequate nutrition will be maintained Outcome: Progressing   Problem: Pain Managment: Goal: General experience of comfort will improve Outcome: Progressing   Problem: Safety: Goal: Ability to remain free from injury will improve Outcome: Progressing   Problem: Skin Integrity: Goal: Risk for impaired skin integrity will decrease Outcome: Progressing

## 2020-01-10 LAB — CBC
HCT: 30.1 % — ABNORMAL LOW (ref 36.0–46.0)
Hemoglobin: 10 g/dL — ABNORMAL LOW (ref 12.0–15.0)
MCH: 31.3 pg (ref 26.0–34.0)
MCHC: 33.2 g/dL (ref 30.0–36.0)
MCV: 94.4 fL (ref 80.0–100.0)
Platelets: 317 10*3/uL (ref 150–400)
RBC: 3.19 MIL/uL — ABNORMAL LOW (ref 3.87–5.11)
RDW: 12.9 % (ref 11.5–15.5)
WBC: 8.5 10*3/uL (ref 4.0–10.5)
nRBC: 0 % (ref 0.0–0.2)

## 2020-01-10 LAB — BASIC METABOLIC PANEL
Anion gap: 11 (ref 5–15)
BUN: 14 mg/dL (ref 6–20)
CO2: 22 mmol/L (ref 22–32)
Calcium: 8.8 mg/dL — ABNORMAL LOW (ref 8.9–10.3)
Chloride: 107 mmol/L (ref 98–111)
Creatinine, Ser: 1.05 mg/dL — ABNORMAL HIGH (ref 0.44–1.00)
GFR calc Af Amer: >60 mL/min (ref >60)
GFR calc non Af Amer: 58 mL/min — ABNORMAL LOW (ref >60)
Glucose, Bld: 151 mg/dL — ABNORMAL HIGH (ref 70–99)
Potassium: 5 mmol/L (ref 3.5–5.1)
Sodium: 140 mmol/L (ref 135–145)

## 2020-01-10 LAB — GLUCOSE, CAPILLARY
Glucose-Capillary: 121 mg/dL — ABNORMAL HIGH (ref 70–99)
Glucose-Capillary: 131 mg/dL — ABNORMAL HIGH (ref 70–99)

## 2020-01-10 LAB — MAGNESIUM: Magnesium: 1.6 mg/dL — ABNORMAL LOW (ref 1.7–2.4)

## 2020-01-10 MED ORDER — MAGNESIUM SULFATE 2 GM/50ML IV SOLN
2.0000 g | Freq: Once | INTRAVENOUS | Status: AC
  Administered 2020-01-10: 2 g via INTRAVENOUS
  Filled 2020-01-10: qty 50

## 2020-01-10 MED ORDER — IBUPROFEN 400 MG PO TABS
400.0000 mg | ORAL_TABLET | Freq: Once | ORAL | Status: AC
  Administered 2020-01-10: 400 mg via ORAL
  Filled 2020-01-10: qty 1

## 2020-01-10 MED ORDER — MORPHINE SULFATE ER 15 MG PO TBCR: 15.0000 mg | EXTENDED_RELEASE_TABLET | Freq: Two times a day (BID) | ORAL | 0 refills | Status: AC

## 2020-01-10 MED ORDER — OXYCODONE-ACETAMINOPHEN 5-325 MG PO TABS: 1.0000 | ORAL_TABLET | Freq: Four times a day (QID) | ORAL | 0 refills | Status: AC | PRN

## 2020-01-10 MED ORDER — FENOFIBRATE 54 MG PO TABS: 54.0000 mg | ORAL_TABLET | Freq: Every day | ORAL | 0 refills | Status: AC

## 2020-01-10 MED ORDER — GABAPENTIN 300 MG PO CAPS: 600.0000 mg | ORAL_CAPSULE | Freq: Three times a day (TID) | ORAL | 0 refills | Status: AC

## 2020-01-10 NOTE — Discharge Summary (Signed)
Physician Discharge Summary  Amy Nunez [id number] DOB: [birth year]   PCP: Einar Pheasant, DO  Admit date: 01/06/2020 Discharge date: 01/10/2020 Length of Stay: 4 days   Code Status: Full Code  Admitted From:  Home Discharged to:   [place]:  None  Equipment/Devices:  None Discharge Condition:  Stable  Recommendations for Outpatient Follow-up   1. Follow up with PCP in 1 week 2. Follow-up magnesium and renal function.   3. Lisinopril held at discharge due to recent AKI and advised not to take NSAIDs. 4. Continue amlodipine and metoprolol, advised to record BP log and bring to PCP appointment 5. Follow-up with pain management.  Opiates cut in half as below. 6. If she still having headaches after the next week or 2 then consider neurology referral 7. Follow-up methylmalonic acid level  Hospital Summary  This is a 59 year old female with history of schizophrenia/bipolar disorder, opioid dependence and chronic pain with recent medication changes, hypertension, diabetes, HFpEF, renal cancer s/p remote right-sided nephrectomy who presented initially to [facility] with dizziness and multiple falls and questionably hitting her head over the past week after tripping and recent hospitalization on 4/15-22 for abdominal pain with nausea and vomiting thought to be from chronic opioid induced constipation.  Additionally, patient complained of right wrist pain after her fall and had a negative right wrist x-ray.  CT head and neck negative for acute changes.  ED course: Found to have SBP 60s improved to 90s with 2 L of IV fluids and received 3rd L after that.  Noted to have leukocytosis with stable anemia and AKI with creatinine of 3.1 and anuric at presentation.  Initially thought to have hypotension secondary to polypharmacy.  Of note noted to have elevated D-dimer of 2.4 and sent to [facility] for [facility] for PE with VQ scan though she was not hypoxic on room air.  VQ scan on 5/12  unremarkable, bilateral lower extremity Dopplers negative for DVT.    5/13: with severe headache, initially thought to be tension headache with scalp laceration she received from wearing a wig at home however this headache progressed and was constant. given elevated D-dimer and hypotension, however without neurologic deficit, there was some concern for increased venous sinus thrombosis after discussion with neurology over the phone and MRI/MR venogram brain was ordered and was unremarkable.  Additionally, she was noted to have magnesium level of 0.8 which was thought to be contributing to her headache.  This was repleted and patient had improved headache prior to discharge.  She was evaluated by PT without need for follow-up.   A & P   Principal Problem:   Hypotension Active Problems:   Type 2 diabetes mellitus (HCC)   Benign essential HTN   Acute kidney injury superimposed on chronic kidney disease (HCC)   Bipolar 1 disorder (HCC)   Renal cancer (HCC)   Chronic pain syndrome   Chronic diastolic CHF (congestive heart failure) (HCC)   Obesity (BMI 30.0-34.9)    1. Hypotension secondary to hypovolemia likely from poor p.o. intake and polypharmacy, resolved a. Resolved with IV fluids, holding antihypertensives on admission and decreased doses of opiates b. D-dimer elevated with negative VQ scan ruling out PE c. Amlodipine and Lopressor started at discharge, hold lisinopril with outpatient follow-up.  Med adjustment as below  2. Recurrent falls, suspect multifactorial: Polypharmacy in setting of AKI, hypotension, possibly component of peripheral neuropathy as well a. Evaluated by PT without any further follow-up needed b. GFR was 16  on admission and was taking gabapentin 800 mg 3 times daily c. Med rec at discharge: Gabapentin 600 mg 3 times daily, Percocet 5-325 every 6 hours as needed, MS Contin 50 mg every 12 hours home tizanidine and other meds. d. Further med rec at hospital  follow-up e. Borderline low vitamin B12 level, follow-up MMA  3. AKI on CKD 3a with history of renal cancer s/p right nephrectomy a. AKI probably from hypotension and hypovolemia and lisinopril b. Creatinine 3.1 on admission, 1.05 at discharge c. Hold lisinopril, NSAIDs with follow-up BMP this week  4. Diabetes a. HA1C 7.7 in April b. Continue home meds with outpatient follow-up  5. Intractable headache of unknown etiology, probably from hypomagnesemia +/-  medication overuse headache +/- tension headache a. CT head negative for acute pathology however did show generalized cerebral atrophy and microvascular disease changes b. Discussed with neurology over the phone: Given hypotension, nausea/vomiting and elevated D-dimer with persistent pain patient is at increased risk of venous thrombosis and MRI brain/MR venogram was ordered which were both unremarkable c. Improved with magnesium repletion d. Improved with ibuprofen x 1  6. Hyperlipidemia with microvascular disease of brain  a. LDL 93 on Lipitor 80 mg b. will start on fibrate for goal LDL less than 70  7. Chronic pain on chronic opiates/sedating medications a. High risk of opioid misuse, diversion and/or overdose b. Home Meds: MS Contin 30 mg every 12 hours, Percocet 10-325 mg as needed, meloxicam, gabapentin 800 mg 3 times daily, hydroxyzine->>>>Med rec: Gabapentin 600 mg 3 times daily, Percocet 5-325 every 6 hours as needed, MS Contin 50 mg every 12 hours home tizanidine and other meds.  Would recommend further titrating down/off medications and follow-up appointments  8. Chronic diastolic heart failure not in acute exacerbation a. Follow-up outpatient  9. Hypomagnesemia a. Magnesium 0.8 -> 1.6.  Received additional 2 g IV mag sulfate prior to discharge with follow-up labs ordered  10. Hypertension a. Continue amlodipine and Lopressor, holding lisinopril with outpatient follow-up BMP  11. Obesity    Consultants   . Discussed with neurology over the phone  Procedures  . None  Antibiotics   Anti-infectives (From admission, onward)   None       Subjective  Patient seen and examined at bedside no acute distress and resting comfortably.  No events overnight.  Tolerating diet. In good spirits and anticipating discharge.   Denies any chest pain, shortness of breath, fever, nausea, vomiting, urinary or bowel complaints.  Headache improved.  No neurologic deficit.  Otherwise ROS negative    Objective   Discharge Exam: Vitals:   01/09/20 2205 01/10/20 0300  BP: (!) 145/90 (!) 165/77  Pulse: 72 72  Resp:    Temp: 98.6 F (37 C) 98.3 F (36.8 C)  SpO2: 91% 98%   Vitals:   01/09/20 1215 01/09/20 1423 01/09/20 2205 01/10/20 0300  BP: 130/86 (!) 144/84 (!) 145/90 (!) 165/77  Pulse: 93 88 72 72  Resp: 18 18    Temp: 98.4 F (36.9 C) 98.5 F (36.9 C) 98.6 F (37 C) 98.3 F (36.8 C)  TempSrc: Oral Oral Oral Oral  SpO2: 100% 100% 91% 98%  Weight:      Height:        Physical Exam Vitals and nursing note reviewed.  Constitutional:      Appearance: Normal appearance.  HENT:     Head: Normocephalic and atraumatic.     Mouth/Throat:     Comments: Edentulous Eyes:  Conjunctiva/sclera: Conjunctivae normal.  Cardiovascular:     Rate and Rhythm: Normal rate and regular rhythm.  Pulmonary:     Effort: Pulmonary effort is normal.     Breath sounds: Normal breath sounds.  Abdominal:     General: Abdomen is flat.     Palpations: Abdomen is soft.  Musculoskeletal:        General: No swelling or tenderness.  Skin:    Coloration: Skin is not jaundiced or pale.  Neurological:     Mental Status: She is alert. Mental status is at baseline.  Psychiatric:        Mood and Affect: Mood normal.        Behavior: Behavior normal.       The results of significant diagnostics from this hospitalization (including imaging, microbiology, ancillary and laboratory) are listed below for  reference.     Microbiology: Recent Results (from the past 240 hour(s))  Blood Culture (routine x 2)     Status: None (Preliminary result)   Collection Time: 01/06/20  8:30 PM   Specimen: BLOOD  Result Value Ref Range Status   Specimen Description   Final    BLOOD LEFT ANTECUBITAL Performed at [facility], [address] [place], [place] [address]    Special Requests   Final    BOTTLES DRAWN AEROBIC AND ANAEROBIC Blood Culture adequate volume Performed at [facility], [address] [place], [place] [address]    Culture   Final    NO GROWTH 4 DAYS Performed at [facility], [place] [address] [place], [place] [address]    Report Status PENDING  Incomplete  SARS Coronavirus 2 by RT PCR (hospital order, performed in [facility] hospital lab) Nasopharyngeal Nasopharyngeal Swab     Status: None   Collection Time: 01/06/20  9:27 PM   Specimen: Nasopharyngeal Swab  Result Value Ref Range Status   SARS Coronavirus 2 NEGATIVE NEGATIVE Final    Comment: (NOTE) SARS-CoV-2 target nucleic acids are NOT DETECTED. The SARS-CoV-2 RNA is generally detectable in upper and lower respiratory specimens during the acute phase of infection. The lowest concentration of SARS-CoV-2 viral copies this assay can detect is 250 copies / mL. A negative result does not preclude SARS-CoV-2 infection and should not be used as the sole basis for treatment or other patient management decisions.  A negative result may occur with improper specimen collection / handling, submission of specimen other than nasopharyngeal swab, presence of viral mutation(s) within the areas targeted by this assay, and inadequate number of viral copies (<250 copies / mL). A negative result must be combined with clinical observations, patient history, and epidemiological information. Fact Sheet for Patients:   StrictlyIdeas.no Fact Sheet for Healthcare  Providers: BankingDealers.co.za This test is not yet approved or cleared  by the [place] FDA and has been authorized for detection and/or diagnosis of SARS-CoV-2 by FDA under an Emergency Use Authorization (EUA).  This EUA will remain in effect (meaning this test can be used) for the duration of the COVID-19 declaration under Section 564(b)(1) of the Act, 21 U.S.C. section 360bbb-3(b)(1), unless the authorization is terminated or revoked sooner. Performed at [facility], [address], [place], [place] [address]   Urine culture     Status: Abnormal   Collection Time: 01/07/20 12:25 AM   Specimen: In/Out Cath Urine  Result Value Ref Range Status   Specimen Description   Final    IN/OUT  CATH URINE Performed at [facility], [address] [place], [place] [address]    Special Requests   Final    NONE Performed at [facility], [address] [place], [place] [address]    Culture MULTIPLE SPECIES PRESENT, SUGGEST RECOLLECTION (A)  Final   Report Status 01/08/2020 FINAL  Final     Labs: BNP (last 3 results) No results for input(s): BNP in the last 8760 hours. Basic Metabolic Panel: Recent Labs  Lab 01/07/20 1229 01/08/20 1009 01/09/20 0141 01/09/20 1434 01/10/20 0329  NA 137 139 138 139 140  K 5.1 5.1 5.2* 4.9 5.0  CL 110 112* 108 110 107  CO2 18* 21* 21* 20* 22  GLUCOSE 189* 157* 211* 166* 151*  BUN 30* 19 15 14 14   CREATININE 1.41* 1.17* 1.21* 1.23* 1.05*  CALCIUM 8.5* 8.6* 8.3* 8.8* 8.8*  MG  --   --  0.8* 1.7 1.6*   Liver Function Tests: Recent Labs  Lab 01/06/20 1949  AST 25  ALT 19  ALKPHOS 108  BILITOT 0.5  PROT 7.0  ALBUMIN 3.5   No results for input(s): LIPASE, AMYLASE in the last 168 hours. No results for input(s): AMMONIA in the last 168 hours. CBC: Recent Labs  Lab 01/06/20 1949 01/07/20 0620 01/08/20 1009 01/09/20 0141 01/10/20 0329  WBC 10.5 10.1 7.0 7.1 8.5  NEUTROABS 7.9*   --   --   --   --   HGB 9.2* 9.6* 9.4* 9.1* 10.0*  HCT 27.1* 28.8* 27.7* 27.4* 30.1*  MCV 94.4 94.7 95.2 96.1 94.4  PLT 329 320 338 318 317   Cardiac Enzymes: No results for input(s): CKTOTAL, CKMB, CKMBINDEX, TROPONINI in the last 168 hours. BNP: Invalid input(s): POCBNP CBG: Recent Labs  Lab 01/09/20 1200 01/09/20 1659 01/09/20 2117 01/10/20 0749 01/10/20 1145  GLUCAP 206* 124* 143* 121* 131*   D-Dimer No results for input(s): DDIMER in the last 72 hours. Hgb A1c No results for input(s): HGBA1C in the last 72 hours. Lipid Profile Recent Labs    01/08/20 1800  CHOL 137  HDL 25*  LDLCALC 93  TRIG 93  CHOLHDL 5.5   Thyroid function studies No results for input(s): TSH, T4TOTAL, T3FREE, THYROIDAB in the last 72 hours.  Invalid input(s): FREET3 Anemia work up Recent Labs    01/08/20 1810  VITAMINB12 316   Urinalysis    Component Value Date/Time   COLORURINE STRAW (A) 01/07/2020 0025   APPEARANCEUR CLEAR 01/07/2020 0025   LABSPEC 1.010 01/07/2020 0025   PHURINE 6.0 01/07/2020 0025   GLUCOSEU NEGATIVE 01/07/2020 0025   HGBUR NEGATIVE 01/07/2020 0025   BILIRUBINUR NEGATIVE 01/07/2020 0025   KETONESUR NEGATIVE 01/07/2020 0025   PROTEINUR NEGATIVE 01/07/2020 0025   NITRITE NEGATIVE 01/07/2020 0025   LEUKOCYTESUR SMALL (A) 01/07/2020 0025   Sepsis Labs Invalid input(s): PROCALCITONIN,  WBC,  LACTICIDVEN Microbiology Recent Results (from the past 240 hour(s))  Blood Culture (routine x 2)     Status: None (Preliminary result)   Collection Time: 01/06/20  8:30 PM   Specimen: BLOOD  Result Value Ref Range Status   Specimen Description   Final    BLOOD LEFT ANTECUBITAL Performed at [facility], [address] [place], [place] [address]    Special Requests   Final    BOTTLES DRAWN AEROBIC AND ANAEROBIC Blood Culture adequate volume Performed at [facility], [address], [place], [place] [address]  Culture   Final    NO GROWTH  4 DAYS Performed at [facility], [place] [address] [place], [place] [address]    Report Status PENDING  Incomplete  SARS Coronavirus 2 by RT PCR (hospital order, performed in [facility] hospital lab) Nasopharyngeal Nasopharyngeal Swab     Status: None   Collection Time: 01/06/20  9:27 PM   Specimen: Nasopharyngeal Swab  Result Value Ref Range Status   SARS Coronavirus 2 NEGATIVE NEGATIVE Final    Comment: (NOTE) SARS-CoV-2 target nucleic acids are NOT DETECTED. The SARS-CoV-2 RNA is generally detectable in upper and lower respiratory specimens during the acute phase of infection. The lowest concentration of SARS-CoV-2 viral copies this assay can detect is 250 copies / mL. A negative result does not preclude SARS-CoV-2 infection and should not be used as the sole basis for treatment or other patient management decisions.  A negative result may occur with improper specimen collection / handling, submission of specimen other than nasopharyngeal swab, presence of viral mutation(s) within the areas targeted by this assay, and inadequate number of viral copies (<250 copies / mL). A negative result must be combined with clinical observations, patient history, and epidemiological information. Fact Sheet for Patients:   StrictlyIdeas.no Fact Sheet for Healthcare Providers: BankingDealers.co.za This test is not yet approved or cleared  by the [place] FDA and has been authorized for detection and/or diagnosis of SARS-CoV-2 by FDA under an Emergency Use Authorization (EUA).  This EUA will remain in effect (meaning this test can be used) for the duration of the COVID-19 declaration under Section 564(b)(1) of the Act, 21 U.S.C. section 360bbb-3(b)(1), unless the authorization is terminated or revoked sooner. Performed at [facility], [address] [place], [place] [address]   Urine culture     Status: Abnormal   Collection  Time: 01/07/20 12:25 AM   Specimen: In/Out Cath Urine  Result Value Ref Range Status   Specimen Description   Final    IN/OUT CATH URINE Performed at [facility], [address] [place], [place] [address]    Special Requests   Final    NONE Performed at [facility], [address] [place], [place] [address]    Culture MULTIPLE SPECIES PRESENT, SUGGEST RECOLLECTION (A)  Final   Report Status 01/08/2020 FINAL  Final    Discharge Instructions     Discharge Instructions    Diet - low sodium heart healthy   Complete by: As directed    Discharge instructions   Complete by: As directed    You were seen and examined in the hospital for multiple falls, confusion which was thought to be due to acute kidney injury along with high doses of pain medications and cared for by a hospitalist.  You also had severe headache which is thought to have been due to low magnesium level  Upon Discharge:  -Decrease your dosage of morphine and Percocet as well as gabapentin.  See after visit summary for medication regimen.   -Use over-the-counter stool softeners and laxatives for opiate-induced constipation -Start taking fenofibrate 54 mg daily for high cholesterol -Do not take lisinopril this week until you get lab work and follow-up with your primary care physician.  Record your blood pressure daily and bring this record to your PCP - Continue taking Amlodipine and Metoprolol for your blood pressure -Do not take meloxicam, ibuprofen, Motrin, Aleve until you discuss with your PCP as this can  injure your kidneys further -Make sure you follow-up with your primary care physician sometime this week with lab work to follow-up on your kidney function and electrolytes. - Make an appointment with your primary care physician this week - Get lab work prior to your follow up appointment with your PCP Bring all home medications to your appointment to review Request that your primary  physician go over all hospital tests and procedures/radiological results at the follow up.   Please get all hospital records sent to your physician by signing a hospital release before you go home.   Read the complete instructions along with all the possible side effects for all the medicines you take and that have been prescribed to you. Take any new medicines after you have completely understood and accept all the possible adverse reactions/side effects.   If you have any questions about your discharge medications or the care you received while you were in the hospital, you can call the unit and asked to speak with the hospitalist on call. Once you are discharged, your primary care physician will handle any further medical issues. Please note that NO REFILLS for any discharge medications will be authorized, as it is imperative that you return to your primary care physician (or establish a relationship with a primary care physician if you do not have one) for your aftercare needs so that they can reassess your need for medications and monitor your lab values.   Do not drive, operate heavy machinery, perform activities at heights, swimming or participation in water activities or provide baby sitting services if your were admitted for loss of consciousness/seizures or if you are on sedating medications including, but not limited to benzodiazepines, sleep medications, narcotic pain medications, etc., until you have been cleared to do so by a medical doctor.   Do not take more than prescribed medications.   Wear a seat belt while driving.  If you have smoked or chewed Tobacco in the last 2 years please stop smoking; also stop any regular Alcohol and/or any Recreational drug use including marijuana.  If you experience worsening of your admission symptoms or develop shortness of breath, chest pain, suicidal or homicidal thoughts or experience a life threatening emergency, you must seek medical attention  immediately by calling 911 or calling your PCP immediately.   Increase activity slowly   Complete by: As directed      Allergies as of 01/10/2020      Reactions   Penicillins Anaphylaxis, Rash   Other reaction(s): Unknown   Ciprofloxacin Hives   Ibuprofen Nausea And Vomiting   Red Dye Diarrhea   Other reaction(s): Unknown      Medication List    STOP taking these medications   gabapentin 800 MG tablet Commonly known as: NEURONTIN Replaced by: gabapentin 300 MG capsule   lisinopril 40 MG tablet Commonly known as: ZESTRIL   meloxicam 15 MG tablet Commonly known as: MOBIC   oxyCODONE-acetaminophen 10-325 MG tablet Commonly known as: PERCOCET Replaced by: oxyCODONE-acetaminophen 5-325 MG tablet     TAKE these medications   acetaminophen 500 MG tablet Commonly known as: TYLENOL Take 2 tablets (1,000 mg total) by mouth every 6 (six) hours.   amLODipine 10 MG tablet Commonly known as: NORVASC Take 10 mg by mouth daily.   atorvastatin 80 MG tablet Commonly known as: LIPITOR Take 80 mg by mouth daily.   fenofibrate 54 MG tablet Take 1 tablet (54 mg total) by mouth daily. Start taking on: Jan 11, 2020  gabapentin 300 MG capsule Commonly known as: NEURONTIN Take 2 capsules (600 mg total) by mouth 3 (three) times daily. Replaces: gabapentin 800 MG tablet   hydrOXYzine 25 MG tablet Commonly known as: ATARAX/VISTARIL Take 25 mg by mouth every 6 (six) hours as needed for anxiety.   Lantus SoloStar 100 UNIT/ML Solostar Pen Generic drug: insulin glargine Inject 5 Units into the skin at bedtime. What changed: how much to take   metoCLOPramide 5 MG tablet Commonly known as: REGLAN Take 1 tablet (5 mg total) by mouth 4 (four) times daily -  before meals and at bedtime for 15 days.   metoprolol tartrate 25 MG tablet Commonly known as: LOPRESSOR Take 0.5 tablets (12.5 mg total) by mouth 2 (two) times daily.   morphine 15 MG 12 hr tablet Commonly known as: MS  CONTIN Take 1 tablet (15 mg total) by mouth every 12 (twelve) hours for 5 days. What changed:   medication strength  how much to take   ondansetron 4 MG tablet Commonly known as: Zofran Take 1 tablet (4 mg total) by mouth daily as needed for nausea or vomiting.   oxyCODONE-acetaminophen 5-325 MG tablet Commonly known as: PERCOCET/ROXICET Take 1 tablet by mouth every 6 (six) hours as needed for up to 5 days for severe pain. Replaces: oxyCODONE-acetaminophen 10-325 MG tablet   pantoprazole 40 MG tablet Commonly known as: PROTONIX Take 1 tablet (40 mg total) by mouth 2 (two) times daily before a meal.   promethazine 25 MG tablet Commonly known as: PHENERGAN Take 25 mg by mouth every 8 (eight) hours as needed for nausea or vomiting.   QUEtiapine 50 MG Tb24 24 hr tablet Commonly known as: SEROQUEL XR Take 50 mg by mouth at bedtime.   saccharomyces boulardii 250 MG capsule Commonly known as: FLORASTOR Take 1 capsule (250 mg total) by mouth 2 (two) times daily.   tiZANidine 4 MG tablet Commonly known as: ZANAFLEX Take 4 mg by mouth every 8 (eight) hours as needed for muscle spasms.   traZODone 100 MG tablet Commonly known as: DESYREL Take 100 mg by mouth at bedtime.       Allergies  Allergen Reactions  . Penicillins Anaphylaxis and Rash    Other reaction(s): Unknown  . Ciprofloxacin Hives  . Ibuprofen Nausea And Vomiting  . Red Dye Diarrhea    Other reaction(s): Unknown    Dispo: The patient is from: Home              Anticipated d/c is to: Home              Anticipated d/c date [id number]              Patient currently is medically stable to d/c.        Time coordinating discharge: Over 30 minutes   SIGNED:   Harold Hedge, D.O. [facility] Pager: [phone]  01/10/2020, 12:31 PM

## 2020-01-11 LAB — CULTURE, BLOOD (ROUTINE X 2)
Culture: NO GROWTH
Special Requests: ADEQUATE

## 2020-01-17 LAB — METHYLMALONIC ACID, SERUM: Methylmalonic Acid, Quantitative: 163 nmol/L (ref 0–378)

## 2020-02-26 DEATH — deceased

## 2022-01-10 IMAGING — CT CT ABD-PELV W/O CM
2 of 4 series · 15 of 46 positions shown, 17 images · non-contrast
Comparison: September 01, 2019

CLINICAL DATA: Left lower quadrant pain.

EXAM:
CT ABDOMEN AND PELVIS WITHOUT CONTRAST
TECHNIQUE: Multidetector CT imaging of the abdomen and pelvis was performed
following the standard protocol without IV contrast.

[Series 2: axial st · axial · 0.83mm/px · z∈[-389,-24]mm · 12 of 81 slices shown, 14 images]
[im 4/81  soft-tissue]
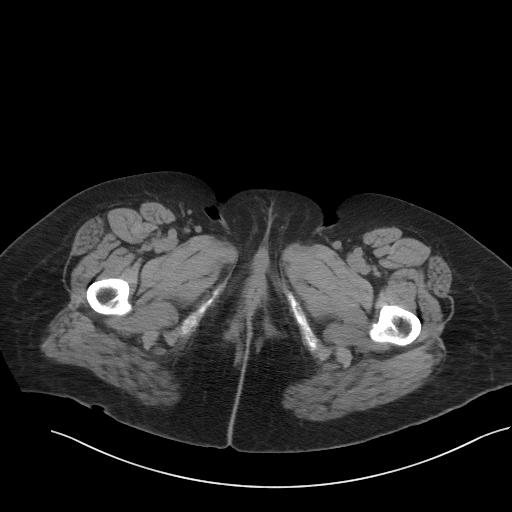
[im 4/81  bone]
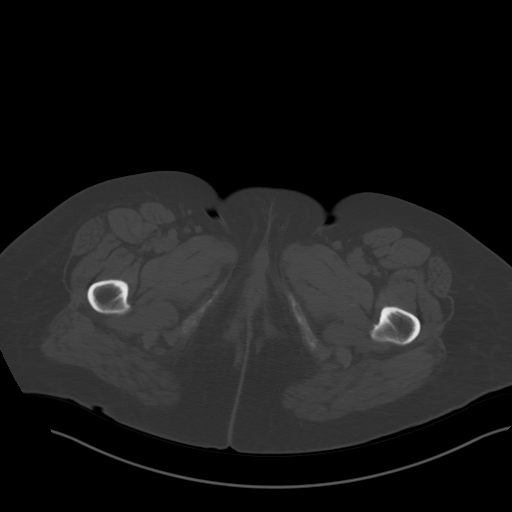
[im 11/81  soft-tissue]
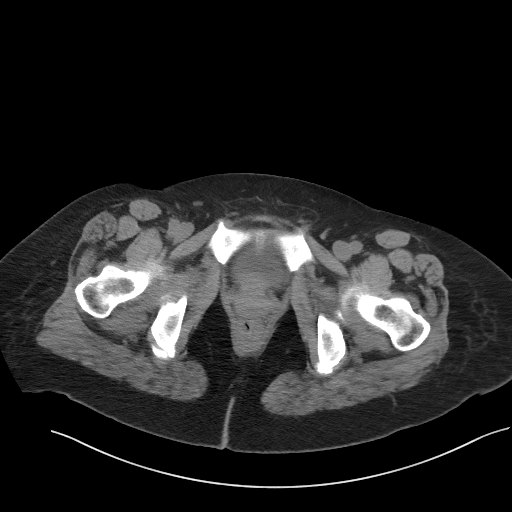
[im 17/81  soft-tissue]
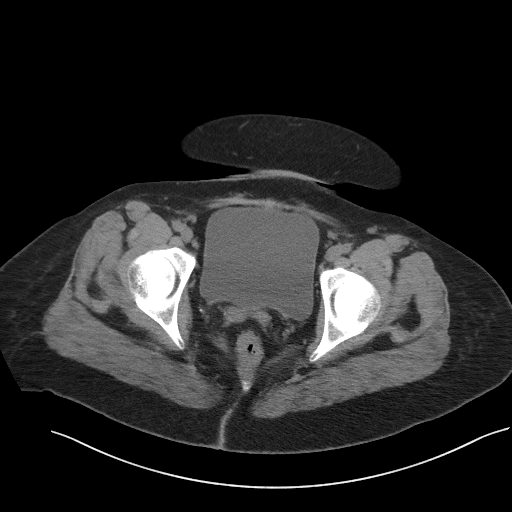
[im 24/81  soft-tissue]
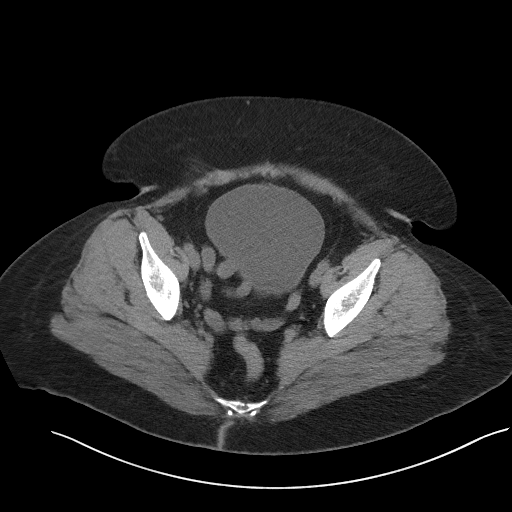
[im 31/81  soft-tissue]
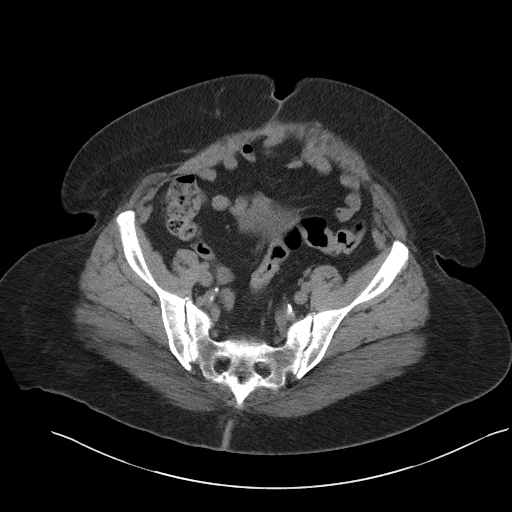
[im 37/81  soft-tissue]
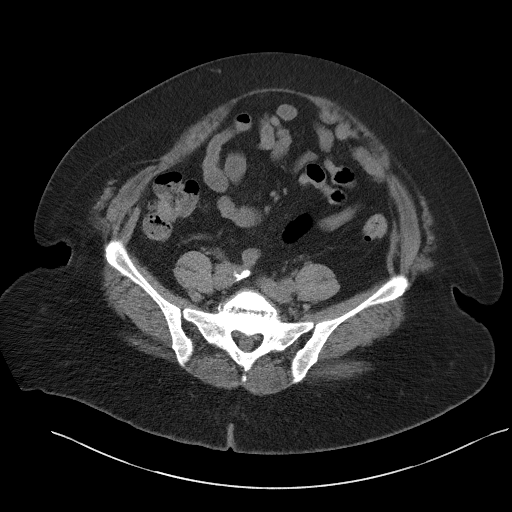
[im 44/81  soft-tissue]
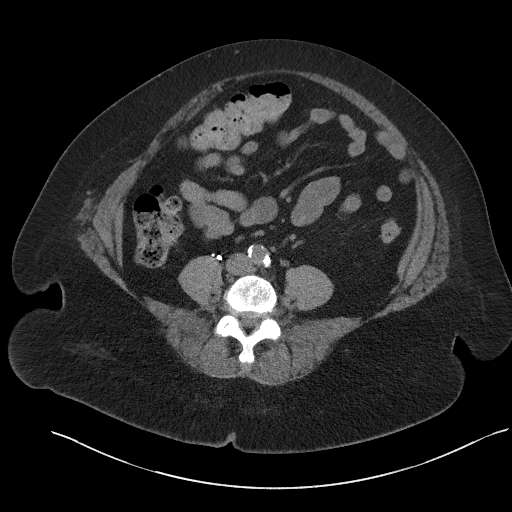
[im 51/81  soft-tissue]
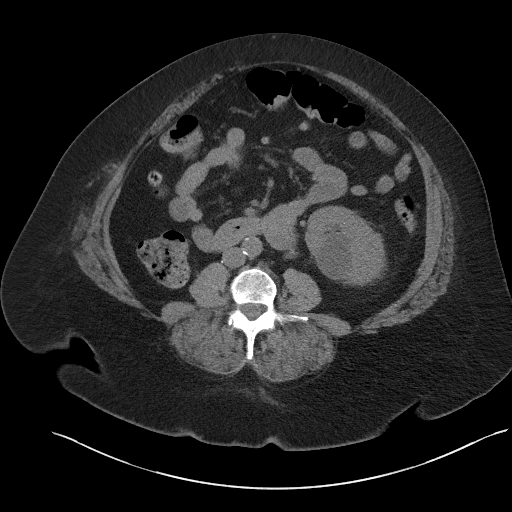
[im 57/81  soft-tissue]
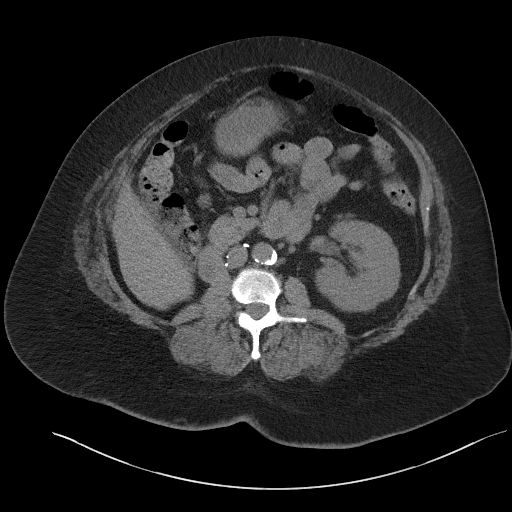
[im 57/81  bone]
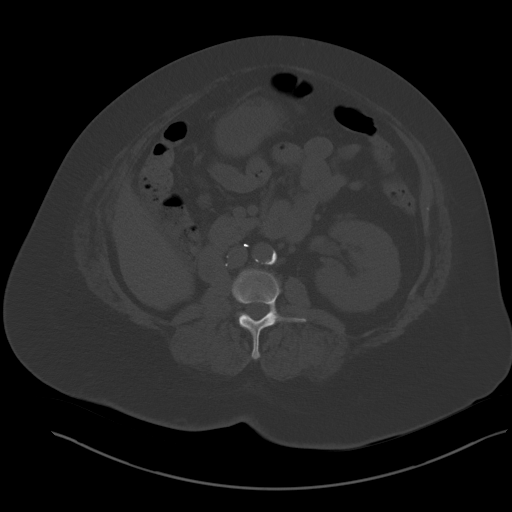
[im 64/81  soft-tissue]
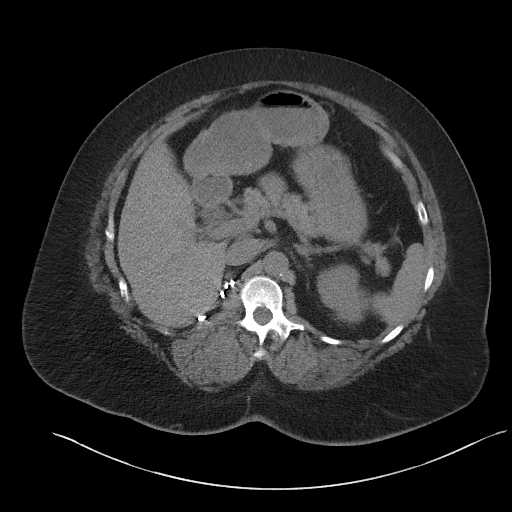
[im 71/81  soft-tissue]
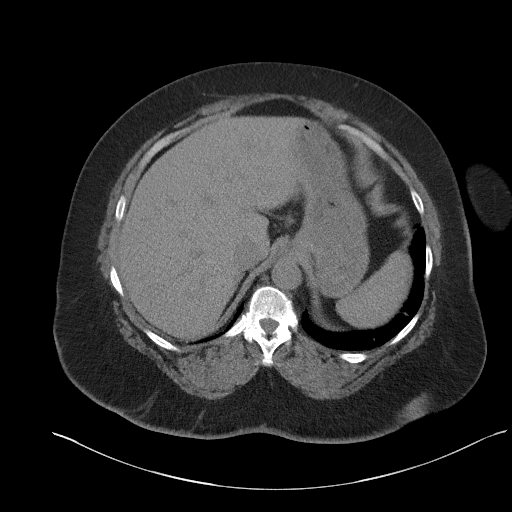
[im 77/81  soft-tissue]
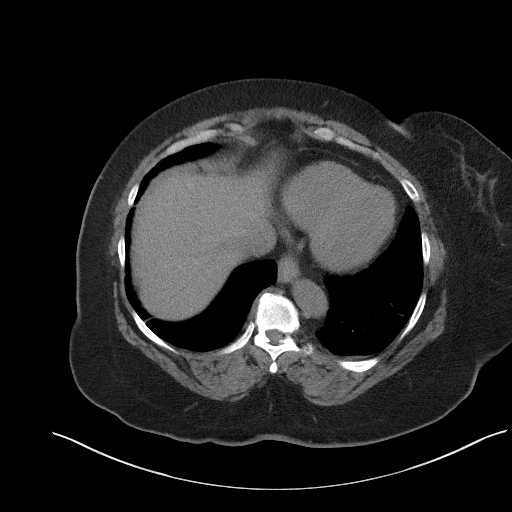

[Series 5: coronal st · coronal · 0.66mm/px · 3 of 117 slices shown]
[im 39/117  soft-tissue]
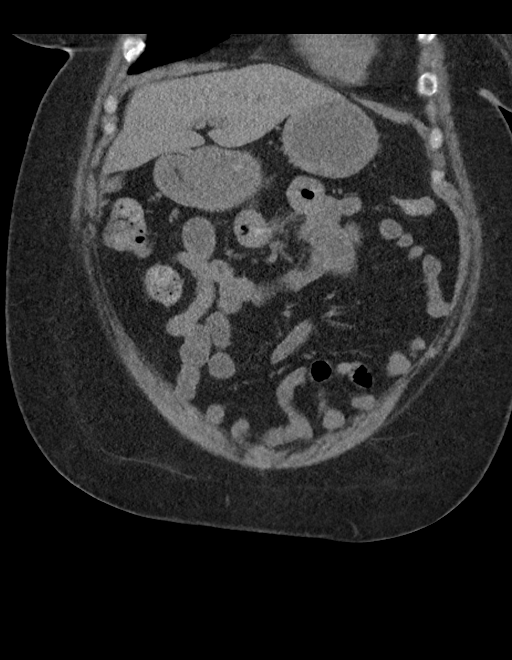
[im 52/117  soft-tissue]
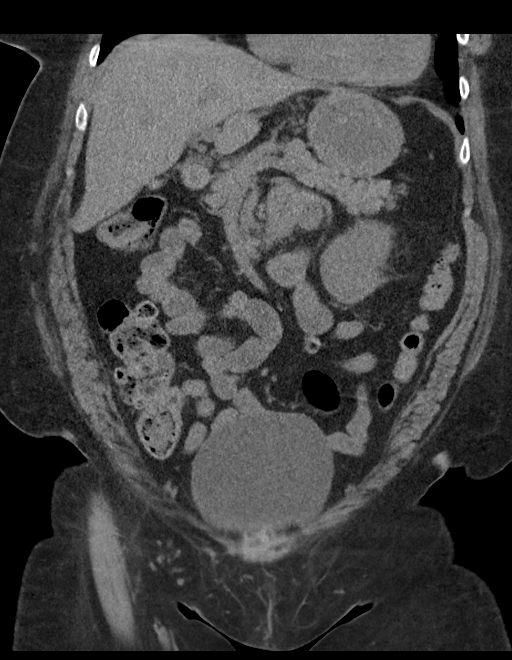
[im 65/117  soft-tissue]
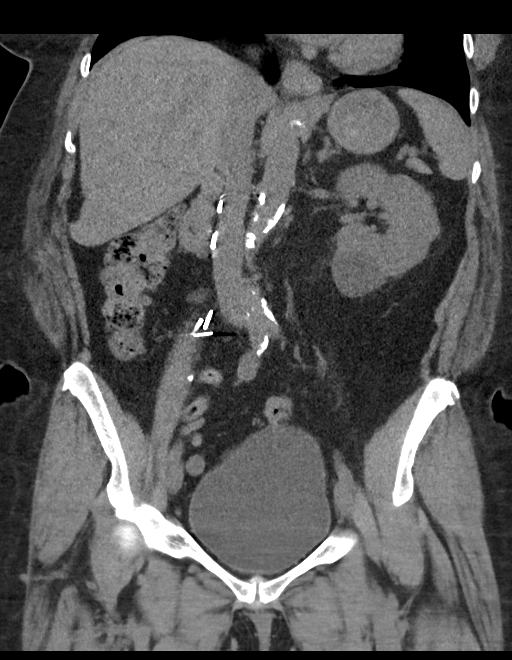

[15 of 46 positions shown; findings below may reference images not displayed]

FINDINGS: Lower chest: There is atelectasis versus scarring at the lung bases
bilaterally.The heart size is normal.

Hepatobiliary: The liver is normal. Normal gallbladder.There is no
biliary ductal dilation.

Pancreas: Normal contours without ductal dilatation. No
peripancreatic fluid collection.

Spleen: No splenic laceration or hematoma.

Adrenals/Urinary Tract:

--Adrenal glands: No adrenal hemorrhage.

--Right kidney/ureter: The patient is status post right-sided
nephrectomy. There is no obvious soft tissue density within the
right nephrectomy bed, however evaluation is limited by lack of IV
contrast.

--Left kidney/ureter: There is no left-sided hydronephrosis. A cyst
is again noted in the lower pole. There is questionable mild they
increase in perinephric stranding about the left kidney. There are
no radiopaque stones. There is suggestion of mild interval increase
in size of the left kidney is well.

--Urinary bladder: Unremarkable.

Stomach/Bowel:

--Stomach/Duodenum: No hiatal hernia or other gastric abnormality.
Normal duodenal course and caliber.

--Small bowel: No dilatation or inflammation.

--Colon: No focal abnormality.

--Appendix: Normal.

Vascular/Lymphatic: Atherosclerotic calcification is present within
the non-aneurysmal abdominal aorta, without hemodynamically
significant stenosis.

--No retroperitoneal lymphadenopathy.

--No mesenteric lymphadenopathy.

--No pelvic or inguinal lymphadenopathy.

Reproductive: Unremarkable

Other: No ascites or free air. The abdominal wall is normal.

Musculoskeletal. No acute displaced fractures.
IMPRESSION: 1. No definite acute intra-abdominal abnormality detected.
2. Subtle increase in left perinephric stranding and possible slight
interval increase in size of the left kidney. Correlation with
urinalysis is recommended to help exclude a process such as
pyelonephritis. There is no hydronephrosis. There are no radiopaque
obstructing kidney stones.
3. Status post right-sided nephrectomy.
4.  Aortic Atherosclerosis ([id number]).
